# Patient Record
Sex: Male | Born: 1965 | Race: White | Hispanic: No | Smoking: Current every day smoker
Health system: Southern US, Community
[De-identification: ages and names within clinical notes are randomized; demographics above are authoritative.]

## PROBLEM LIST (undated history)

## (undated) DIAGNOSIS — G4730 Sleep apnea, unspecified: Secondary | ICD-10-CM

## (undated) DIAGNOSIS — R195 Other fecal abnormalities: Secondary | ICD-10-CM

## (undated) DIAGNOSIS — G4700 Insomnia, unspecified: Secondary | ICD-10-CM

## (undated) DIAGNOSIS — R519 Headache, unspecified: Secondary | ICD-10-CM

## (undated) DIAGNOSIS — G834 Cauda equina syndrome: Secondary | ICD-10-CM

## (undated) DIAGNOSIS — F17200 Nicotine dependence, unspecified, uncomplicated: Secondary | ICD-10-CM

## (undated) DIAGNOSIS — F4320 Adjustment disorder, unspecified: Secondary | ICD-10-CM

## (undated) DIAGNOSIS — K219 Gastro-esophageal reflux disease without esophagitis: Secondary | ICD-10-CM

## (undated) DIAGNOSIS — B9562 Methicillin resistant Staphylococcus aureus infection as the cause of diseases classified elsewhere: Secondary | ICD-10-CM

## (undated) DIAGNOSIS — R51 Headache: Secondary | ICD-10-CM

## (undated) DIAGNOSIS — M961 Postlaminectomy syndrome, not elsewhere classified: Secondary | ICD-10-CM

## (undated) DIAGNOSIS — L03119 Cellulitis of unspecified part of limb: Secondary | ICD-10-CM

## (undated) DIAGNOSIS — R7881 Bacteremia: Secondary | ICD-10-CM

## (undated) DIAGNOSIS — R5381 Other malaise: Secondary | ICD-10-CM

## (undated) DIAGNOSIS — S31000A Unspecified open wound of lower back and pelvis without penetration into retroperitoneum, initial encounter: Secondary | ICD-10-CM

## (undated) DIAGNOSIS — M5412 Radiculopathy, cervical region: Secondary | ICD-10-CM

## (undated) DIAGNOSIS — R609 Edema, unspecified: Secondary | ICD-10-CM

## (undated) DIAGNOSIS — G8929 Other chronic pain: Secondary | ICD-10-CM

## (undated) DIAGNOSIS — Z87442 Personal history of urinary calculi: Secondary | ICD-10-CM

## (undated) DIAGNOSIS — M1990 Unspecified osteoarthritis, unspecified site: Secondary | ICD-10-CM

## (undated) DIAGNOSIS — J45909 Unspecified asthma, uncomplicated: Secondary | ICD-10-CM

## (undated) HISTORY — DX: Cellulitis of unspecified part of limb: L03.119

## (undated) HISTORY — DX: Nicotine dependence, unspecified, uncomplicated: F17.200

## (undated) HISTORY — DX: Other malaise: R53.81

## (undated) HISTORY — DX: Other chronic pain: G89.29

## (undated) HISTORY — DX: Adjustment disorder, unspecified: F43.20

## (undated) HISTORY — DX: Headache, unspecified: R51.9

## (undated) HISTORY — DX: Edema, unspecified: R60.9

## (undated) HISTORY — DX: Insomnia, unspecified: G47.00

## (undated) HISTORY — PX: OTHER SURGICAL HISTORY: SHX169

## (undated) HISTORY — PX: SPINAL CORD STIMULATOR INSERTION: SHX5378

## (undated) HISTORY — PX: LUMBAR FUSION: SHX111

## (undated) HISTORY — DX: Unspecified open wound of lower back and pelvis without penetration into retroperitoneum, initial encounter: S31.000A

## (undated) HISTORY — DX: Headache: R51

## (undated) HISTORY — PX: CARPAL TUNNEL RELEASE: SHX101

## (undated) HISTORY — DX: Other fecal abnormalities: R19.5

## (undated) HISTORY — PX: POSTERIOR LAMINECTOMY / DECOMPRESSION LUMBAR SPINE: SUR740

## (undated) HISTORY — PX: CERVICAL FUSION: SHX112

---

## 2014-10-11 DIAGNOSIS — M25562 Pain in left knee: Secondary | ICD-10-CM | POA: Diagnosis not present

## 2014-10-11 DIAGNOSIS — Z79899 Other long term (current) drug therapy: Secondary | ICD-10-CM | POA: Diagnosis not present

## 2014-10-11 DIAGNOSIS — R5383 Other fatigue: Secondary | ICD-10-CM | POA: Diagnosis not present

## 2014-10-11 DIAGNOSIS — F17200 Nicotine dependence, unspecified, uncomplicated: Secondary | ICD-10-CM | POA: Diagnosis not present

## 2014-10-11 DIAGNOSIS — G894 Chronic pain syndrome: Secondary | ICD-10-CM | POA: Diagnosis not present

## 2014-10-11 DIAGNOSIS — J45909 Unspecified asthma, uncomplicated: Secondary | ICD-10-CM | POA: Diagnosis not present

## 2014-10-11 DIAGNOSIS — R6882 Decreased libido: Secondary | ICD-10-CM | POA: Diagnosis not present

## 2014-10-25 DIAGNOSIS — E785 Hyperlipidemia, unspecified: Secondary | ICD-10-CM | POA: Diagnosis not present

## 2014-10-25 DIAGNOSIS — G894 Chronic pain syndrome: Secondary | ICD-10-CM | POA: Diagnosis not present

## 2014-10-25 DIAGNOSIS — E875 Hyperkalemia: Secondary | ICD-10-CM | POA: Diagnosis not present

## 2014-10-25 DIAGNOSIS — E291 Testicular hypofunction: Secondary | ICD-10-CM | POA: Diagnosis not present

## 2014-11-28 DIAGNOSIS — J209 Acute bronchitis, unspecified: Secondary | ICD-10-CM | POA: Diagnosis not present

## 2014-12-24 DIAGNOSIS — M25562 Pain in left knee: Secondary | ICD-10-CM | POA: Diagnosis not present

## 2015-01-20 DIAGNOSIS — G9520 Unspecified cord compression: Secondary | ICD-10-CM | POA: Insufficient documentation

## 2015-01-21 DIAGNOSIS — Z4689 Encounter for fitting and adjustment of other specified devices: Secondary | ICD-10-CM | POA: Diagnosis not present

## 2015-01-23 DIAGNOSIS — Z23 Encounter for immunization: Secondary | ICD-10-CM | POA: Diagnosis not present

## 2015-01-23 DIAGNOSIS — M898X8 Other specified disorders of bone, other site: Secondary | ICD-10-CM | POA: Diagnosis not present

## 2015-01-23 DIAGNOSIS — T85890A Other specified complication of nervous system prosthetic devices, implants and grafts, initial encounter: Secondary | ICD-10-CM | POA: Diagnosis not present

## 2015-01-23 DIAGNOSIS — Z888 Allergy status to other drugs, medicaments and biological substances status: Secondary | ICD-10-CM | POA: Diagnosis not present

## 2015-01-23 DIAGNOSIS — Z981 Arthrodesis status: Secondary | ICD-10-CM | POA: Diagnosis not present

## 2015-01-23 DIAGNOSIS — F17210 Nicotine dependence, cigarettes, uncomplicated: Secondary | ICD-10-CM | POA: Diagnosis not present

## 2015-01-23 DIAGNOSIS — G9589 Other specified diseases of spinal cord: Secondary | ICD-10-CM | POA: Diagnosis not present

## 2015-01-23 DIAGNOSIS — M5116 Intervertebral disc disorders with radiculopathy, lumbar region: Secondary | ICD-10-CM | POA: Diagnosis not present

## 2015-01-23 DIAGNOSIS — M4806 Spinal stenosis, lumbar region: Secondary | ICD-10-CM | POA: Diagnosis not present

## 2015-01-23 DIAGNOSIS — G960 Cerebrospinal fluid leak: Secondary | ICD-10-CM | POA: Diagnosis not present

## 2015-01-23 DIAGNOSIS — J45909 Unspecified asthma, uncomplicated: Secondary | ICD-10-CM | POA: Diagnosis not present

## 2015-01-23 DIAGNOSIS — Z885 Allergy status to narcotic agent status: Secondary | ICD-10-CM | POA: Diagnosis not present

## 2015-02-13 DIAGNOSIS — M6281 Muscle weakness (generalized): Secondary | ICD-10-CM | POA: Diagnosis not present

## 2015-02-13 DIAGNOSIS — Z23 Encounter for immunization: Secondary | ICD-10-CM | POA: Diagnosis not present

## 2015-03-01 DIAGNOSIS — M4317 Spondylolisthesis, lumbosacral region: Secondary | ICD-10-CM | POA: Diagnosis not present

## 2015-03-01 DIAGNOSIS — R7982 Elevated C-reactive protein (CRP): Secondary | ICD-10-CM | POA: Diagnosis not present

## 2015-03-01 DIAGNOSIS — I10 Essential (primary) hypertension: Secondary | ICD-10-CM | POA: Diagnosis not present

## 2015-03-01 DIAGNOSIS — L0390 Cellulitis, unspecified: Secondary | ICD-10-CM | POA: Insufficient documentation

## 2015-03-01 DIAGNOSIS — R509 Fever, unspecified: Secondary | ICD-10-CM | POA: Diagnosis not present

## 2015-03-01 DIAGNOSIS — R700 Elevated erythrocyte sedimentation rate: Secondary | ICD-10-CM | POA: Diagnosis not present

## 2015-03-01 DIAGNOSIS — M545 Low back pain: Secondary | ICD-10-CM | POA: Diagnosis not present

## 2015-03-02 DIAGNOSIS — M7989 Other specified soft tissue disorders: Secondary | ICD-10-CM | POA: Diagnosis not present

## 2015-03-08 LAB — BASIC METABOLIC PANEL
BUN: 3 mg/dL — AB (ref 4–21)
CREATININE: 0.9 mg/dL (ref <=1.3)
GLUCOSE: 88 mg/dL
Potassium: 4.3 mmol/L (ref 3.4–5.3)

## 2015-03-08 LAB — CBC AND DIFFERENTIAL
HCT: 44 % (ref 41–53)
HEMOGLOBIN: 14.7 g/dL (ref 13.5–17.5)
PLATELETS: 156 10*3/uL (ref 150–399)
WBC: 7 10^3/mL

## 2015-03-14 ENCOUNTER — Other Ambulatory Visit: Payer: Self-pay

## 2015-03-14 ENCOUNTER — Encounter: Payer: Self-pay | Admitting: Adult Health

## 2015-03-14 ENCOUNTER — Non-Acute Institutional Stay (SKILLED_NURSING_FACILITY): Payer: Medicare Other | Admitting: Adult Health

## 2015-03-14 DIAGNOSIS — R51 Headache: Secondary | ICD-10-CM | POA: Diagnosis not present

## 2015-03-14 DIAGNOSIS — F419 Anxiety disorder, unspecified: Secondary | ICD-10-CM

## 2015-03-14 DIAGNOSIS — G8929 Other chronic pain: Secondary | ICD-10-CM

## 2015-03-14 DIAGNOSIS — R5381 Other malaise: Secondary | ICD-10-CM | POA: Diagnosis not present

## 2015-03-14 DIAGNOSIS — F329 Major depressive disorder, single episode, unspecified: Secondary | ICD-10-CM | POA: Diagnosis not present

## 2015-03-14 DIAGNOSIS — G4700 Insomnia, unspecified: Secondary | ICD-10-CM | POA: Diagnosis not present

## 2015-03-14 DIAGNOSIS — G629 Polyneuropathy, unspecified: Secondary | ICD-10-CM | POA: Diagnosis not present

## 2015-03-14 DIAGNOSIS — F0280 Dementia in other diseases classified elsewhere without behavioral disturbance: Secondary | ICD-10-CM | POA: Diagnosis not present

## 2015-03-14 DIAGNOSIS — L03115 Cellulitis of right lower limb: Secondary | ICD-10-CM | POA: Diagnosis not present

## 2015-03-14 DIAGNOSIS — S31109S Unspecified open wound of abdominal wall, unspecified quadrant without penetration into peritoneal cavity, sequela: Secondary | ICD-10-CM

## 2015-03-14 DIAGNOSIS — S31000S Unspecified open wound of lower back and pelvis without penetration into retroperitoneum, sequela: Secondary | ICD-10-CM

## 2015-03-14 DIAGNOSIS — G3183 Dementia with Lewy bodies: Secondary | ICD-10-CM

## 2015-03-14 DIAGNOSIS — F32A Depression, unspecified: Secondary | ICD-10-CM

## 2015-03-14 DIAGNOSIS — G894 Chronic pain syndrome: Secondary | ICD-10-CM

## 2015-03-14 MED ORDER — CLONAZEPAM 1 MG PO TABS: 1.0000 mg | ORAL_TABLET | Freq: Two times a day (BID) | ORAL | Status: DC | PRN

## 2015-03-14 NOTE — Telephone Encounter (Signed)
Rx faxed to [facility] @ [phone], phone number [phone]  

## 2015-03-14 NOTE — Telephone Encounter (Signed)
I called [facility] to confirm patient is a resident

## 2015-03-15 ENCOUNTER — Non-Acute Institutional Stay (SKILLED_NURSING_FACILITY): Payer: Medicare Other | Admitting: Internal Medicine

## 2015-03-15 DIAGNOSIS — S31000S Unspecified open wound of lower back and pelvis without penetration into retroperitoneum, sequela: Secondary | ICD-10-CM

## 2015-03-15 DIAGNOSIS — R519 Headache, unspecified: Secondary | ICD-10-CM

## 2015-03-15 DIAGNOSIS — R5381 Other malaise: Secondary | ICD-10-CM

## 2015-03-15 DIAGNOSIS — R51 Headache: Secondary | ICD-10-CM

## 2015-03-15 DIAGNOSIS — S31109S Unspecified open wound of abdominal wall, unspecified quadrant without penetration into peritoneal cavity, sequela: Secondary | ICD-10-CM | POA: Diagnosis not present

## 2015-03-15 DIAGNOSIS — L03115 Cellulitis of right lower limb: Secondary | ICD-10-CM

## 2015-03-15 DIAGNOSIS — G6289 Other specified polyneuropathies: Secondary | ICD-10-CM | POA: Diagnosis not present

## 2015-03-15 DIAGNOSIS — R195 Other fecal abnormalities: Secondary | ICD-10-CM | POA: Diagnosis not present

## 2015-03-15 DIAGNOSIS — F4323 Adjustment disorder with mixed anxiety and depressed mood: Secondary | ICD-10-CM | POA: Diagnosis not present

## 2015-03-15 DIAGNOSIS — G4700 Insomnia, unspecified: Secondary | ICD-10-CM

## 2015-03-15 DIAGNOSIS — G894 Chronic pain syndrome: Secondary | ICD-10-CM

## 2015-03-15 DIAGNOSIS — F17200 Nicotine dependence, unspecified, uncomplicated: Secondary | ICD-10-CM

## 2015-03-15 DIAGNOSIS — G8929 Other chronic pain: Secondary | ICD-10-CM

## 2015-03-15 NOTE — Progress Notes (Signed)
Patient ID: Jared Li, male   DOB: 09/07/1965, 49 y.o.   MRN: [id number]     [facility]   PCP: No primary care provider on file.  Code Status: FULL CODE  Allergies  Allergen Reactions  . Adhesive [Tape]   . Codeine   . Morphine And Related     Itching     Chief Complaint  Patient presents with  . New Admit To SNF     HPI:  49 y.o. patient is here for short term rehabilitation post hospital admission from 03/01/15-03/13/15 with right lower extremity cellulitis and infection of his lumbar wound. He was seen by ID and is currently on vancomycin, cipro and flagyl. Limited medical records from hospital available for review. He is seen in his room getting his antibiotic via picc line. He continues to smoke and currently has nicoderm patch. His aricept has been changed from bid to qd for unclear reason in the facility. He is on this for his chronic headache. He has a pain pump and this along with current prn pain regimen has been helping him.   Review of Systems:  Constitutional: Negative for fever, chills HENT: Negative for headache, congestion, nasal discharge, difficulty swallowing.   Eyes: Negative for eye pain, blurred vision, double vision and discharge.  Respiratory: Negative for cough, shortness of breath and wheezing.   Cardiovascular: Negative for chest pain, palpitations, leg swelling.  Gastrointestinal: Negative for heartburn, nausea, vomiting, abdominal pain. Has loose stools Genitourinary: Negative for dysuria, flank pain.  Musculoskeletal: Negative for  falls Skin: Negative for rash.  Neurological: Negative for dizziness.  Psychiatric/Behavioral: Negative for depression  Reviewed past medical history of chronic pain, asthma, chronic migraine headache, depression, neuropathic pain  Social History:   reports that he has been smoking Cigarettes.  He has been smoking about 0.50 packs per day. He uses smokeless tobacco. He  reports that he drinks alcohol. He reports that he does not use illicit drugs.  No family history on file.  Medications:   Medication List       This list is accurate as of: 03/15/15  5:17 PM.  Always use your most recent med list.               albuterol 108 (90 Base) MCG/ACT inhaler  Commonly known as:  PROVENTIL HFA;VENTOLIN HFA  Inhale into the lungs every 6 (six) hours as needed for wheezing or shortness of breath.     ciprofloxacin 500 MG tablet  Commonly known as:  CIPRO  Take 500 mg by mouth 2 (two) times daily.     clonazePAM 1 MG tablet  Commonly known as:  KLONOPIN  Take 1 tablet (1 mg total) by mouth 2 (two) times daily as needed for anxiety.     cyclobenzaprine 10 MG tablet  Commonly known as:  FLEXERIL  Take 10 mg by mouth 3 (three) times daily as needed for muscle spasms.     diphenhydrAMINE 25 MG tablet  Commonly known as:  BENADRYL  Take 25 mg by mouth every 6 (six) hours as needed.     donepezil 10 MG tablet  Commonly known as:  ARICEPT  Take 10 mg by mouth 2 (two) times daily.     dronabinol 5 MG capsule  Commonly known as:  MARINOL  Take 5 mg by mouth 2 (two) times daily before a meal.     Eszopiclone 3 MG Tabs  Take 3 mg by mouth at bedtime. Take immediately  before bedtime     gabapentin 600 MG tablet  Commonly known as:  NEURONTIN  Take 600 mg by mouth. 5 TABLET DAILY AT BEDTIME     levETIRAcetam 1000 MG tablet  Commonly known as:  KEPPRA  Take 1,000 mg by mouth 2 (two) times daily.     metroNIDAZOLE 500 MG tablet  Commonly known as:  FLAGYL  Take 500 mg by mouth 2 (two) times daily.     ondansetron 8 MG tablet  Commonly known as:  ZOFRAN  Take by mouth every 8 (eight) hours as needed for nausea or vomiting.     Oxycodone HCl 20 MG Tabs  Take 1 tablet by mouth every 4 (four) hours as needed.     rizatriptan 10 MG tablet  Commonly known as:  MAXALT  Take 10 mg by mouth as needed for migraine. May repeat in 2 hours if needed      sertraline 100 MG tablet  Commonly known as:  ZOLOFT  Take 100 mg by mouth daily.     Vancomycin HCl in Dextrose 1.5-5 GM/250ML-% Soln  Inject 1.5 g into the vein every 12 (twelve) hours.         Physical Exam: Filed Vitals:   03/15/15 1706  BP: 140/64  Pulse: 74  Temp: 97.7 F (36.5 C)  Resp: 16  SpO2: 97%    General- adult male, well built, in no acute distress Head- normocephalic, atraumatic Nose- normal nasal mucosa, no maxillary or frontal sinus tenderness, no nasal discharge Throat- moist mucus membrane Eyes- PERRLA, EOMI, no pallor, no icterus, no discharge, normal conjunctiva, normal sclera Neck- no cervical lymphadenopathy Cardiovascular- normal s1,s2, no murmurs, palpable dorsalis pedis and radial pulses, no leg edema Respiratory- bilateral clear to auscultation, no wheeze, no rhonchi, no crackles, no use of accessory muscles Abdomen- bowel sounds present, soft, non tender Musculoskeletal- able to move all 4 extremities, right upper extremity and right lower extremity weakness + Neurological- numbness from waist down, alert and oriented to person, place and time Skin- warm and dry, healing lumbar incision without drainage, scar in cervical region, LUE PICC line Nails- hypertrophy, ingrown Psychiatry- normal mood and affect    Labs reviewed: None available for review   Assessment/Plan  Physical deconditioning Will have him work with physical therapy and occupational therapy team to help with gait training and muscle strengthening exercises.fall precautions. Skin care. Encourage to be out of bed.   Lumbar wound infection Healing well. Afebrile. Continue cipro and flagyl course until 03/23/15. There is no stop date for vancomycin. Patient mentions that the plan per ID is to continue it until February. Has upcoming f/u with ID on 03/22/15. Monitor weekly cbc and cmp and get vancomycin renally dosed  RLE cellulitis Resolved, monitor clinically  Loose  stool Likely antibiotic related. Add probiotics for now, hydration to be maintained  Tobacco user Continues to smoke, discontinue nicoderm patch  Chronic pain syndrome With hx of cervical fracture and lumbar spinal stenosis. Continue oxycodone 20 mg q4h prn for breakthrough pain and has his pain pump in place  Peripheral neuropathy On gabapentin 600 mg 5 tab po qhs per discharge summary, alert and oriented at present, monitor clinically  Chronic headache On rizatriptan, keppra, aricept and dronabinol for now and monitor  Mood disorder Continue keppra and zoloft, no changes made to his dosing  Insomnia Continue eszopiclone 3 mg qhs    Goals of care: short term rehabilitation   Labs/tests ordered: cbc with diff, cmp weekly  Family/ staff Communication: reviewed care plan with patient and nursing supervisor    Oneal GroutMAHIMA Amiee Wiley, MD  [facility] [phone] (Monday-Friday 8 am - 5 pm) [phone] (afterhours)

## 2015-03-16 DIAGNOSIS — Z79899 Other long term (current) drug therapy: Secondary | ICD-10-CM | POA: Diagnosis not present

## 2015-03-21 ENCOUNTER — Other Ambulatory Visit: Payer: Self-pay | Admitting: *Deleted

## 2015-03-21 DIAGNOSIS — Z792 Long term (current) use of antibiotics: Secondary | ICD-10-CM | POA: Diagnosis not present

## 2015-03-21 DIAGNOSIS — Z79899 Other long term (current) drug therapy: Secondary | ICD-10-CM | POA: Diagnosis not present

## 2015-03-21 LAB — BASIC METABOLIC PANEL
BUN: 7 mg/dL (ref 4–21)
CREATININE: 1 mg/dL (ref 0.6–1.3)
GLUCOSE: 87 mg/dL
Potassium: 4.3 mmol/L (ref 3.4–5.3)
SODIUM: 140 mmol/L (ref 137–147)

## 2015-03-21 LAB — HEPATIC FUNCTION PANEL
ALT: 15 U/L (ref 10–40)
AST: 16 U/L (ref 14–40)
Alkaline Phosphatase: 90 U/L (ref 25–125)
BILIRUBIN, TOTAL: 0.3 mg/dL

## 2015-03-21 MED ORDER — OXYCODONE HCL 10 MG PO TABS: ORAL_TABLET | ORAL | Status: DC

## 2015-03-21 NOTE — Telephone Encounter (Signed)
[facility] 

## 2015-03-22 DIAGNOSIS — M4326 Fusion of spine, lumbar region: Secondary | ICD-10-CM | POA: Diagnosis not present

## 2015-03-22 DIAGNOSIS — Z5189 Encounter for other specified aftercare: Secondary | ICD-10-CM | POA: Diagnosis not present

## 2015-03-22 DIAGNOSIS — M6281 Muscle weakness (generalized): Secondary | ICD-10-CM | POA: Diagnosis not present

## 2015-03-22 DIAGNOSIS — R2681 Unsteadiness on feet: Secondary | ICD-10-CM | POA: Diagnosis not present

## 2015-03-22 DIAGNOSIS — M545 Low back pain: Secondary | ICD-10-CM | POA: Diagnosis not present

## 2015-03-22 DIAGNOSIS — G834 Cauda equina syndrome: Secondary | ICD-10-CM | POA: Diagnosis not present

## 2015-03-22 DIAGNOSIS — S343XXD Injury of cauda equina, subsequent encounter: Secondary | ICD-10-CM | POA: Diagnosis not present

## 2015-03-22 DIAGNOSIS — G8929 Other chronic pain: Secondary | ICD-10-CM | POA: Diagnosis not present

## 2015-03-22 DIAGNOSIS — M6249 Contracture of muscle, multiple sites: Secondary | ICD-10-CM | POA: Diagnosis not present

## 2015-03-22 DIAGNOSIS — L03115 Cellulitis of right lower limb: Secondary | ICD-10-CM | POA: Diagnosis not present

## 2015-03-23 DIAGNOSIS — M6281 Muscle weakness (generalized): Secondary | ICD-10-CM | POA: Diagnosis not present

## 2015-03-23 DIAGNOSIS — M4326 Fusion of spine, lumbar region: Secondary | ICD-10-CM | POA: Diagnosis not present

## 2015-03-23 DIAGNOSIS — Z5189 Encounter for other specified aftercare: Secondary | ICD-10-CM | POA: Diagnosis not present

## 2015-03-23 DIAGNOSIS — G8929 Other chronic pain: Secondary | ICD-10-CM | POA: Diagnosis not present

## 2015-03-23 DIAGNOSIS — M545 Low back pain: Secondary | ICD-10-CM | POA: Diagnosis not present

## 2015-03-23 DIAGNOSIS — L03115 Cellulitis of right lower limb: Secondary | ICD-10-CM | POA: Diagnosis not present

## 2015-03-23 DIAGNOSIS — R2681 Unsteadiness on feet: Secondary | ICD-10-CM | POA: Diagnosis not present

## 2015-03-23 DIAGNOSIS — S343XXD Injury of cauda equina, subsequent encounter: Secondary | ICD-10-CM | POA: Diagnosis not present

## 2015-03-23 DIAGNOSIS — G834 Cauda equina syndrome: Secondary | ICD-10-CM | POA: Diagnosis not present

## 2015-03-23 DIAGNOSIS — M6249 Contracture of muscle, multiple sites: Secondary | ICD-10-CM | POA: Diagnosis not present

## 2015-03-24 DIAGNOSIS — M6249 Contracture of muscle, multiple sites: Secondary | ICD-10-CM | POA: Diagnosis not present

## 2015-03-24 DIAGNOSIS — M6281 Muscle weakness (generalized): Secondary | ICD-10-CM | POA: Diagnosis not present

## 2015-03-24 DIAGNOSIS — Z792 Long term (current) use of antibiotics: Secondary | ICD-10-CM | POA: Diagnosis not present

## 2015-03-24 DIAGNOSIS — G8929 Other chronic pain: Secondary | ICD-10-CM | POA: Diagnosis not present

## 2015-03-24 DIAGNOSIS — R2681 Unsteadiness on feet: Secondary | ICD-10-CM | POA: Diagnosis not present

## 2015-03-24 DIAGNOSIS — G834 Cauda equina syndrome: Secondary | ICD-10-CM | POA: Diagnosis not present

## 2015-03-24 DIAGNOSIS — M4326 Fusion of spine, lumbar region: Secondary | ICD-10-CM | POA: Diagnosis not present

## 2015-03-24 DIAGNOSIS — M545 Low back pain: Secondary | ICD-10-CM | POA: Diagnosis not present

## 2015-03-24 DIAGNOSIS — S343XXD Injury of cauda equina, subsequent encounter: Secondary | ICD-10-CM | POA: Diagnosis not present

## 2015-03-24 DIAGNOSIS — L03115 Cellulitis of right lower limb: Secondary | ICD-10-CM | POA: Diagnosis not present

## 2015-03-24 DIAGNOSIS — Z5189 Encounter for other specified aftercare: Secondary | ICD-10-CM | POA: Diagnosis not present

## 2015-03-27 DIAGNOSIS — Z79899 Other long term (current) drug therapy: Secondary | ICD-10-CM | POA: Diagnosis not present

## 2015-03-27 LAB — CBC AND DIFFERENTIAL
HEMATOCRIT: 47 % (ref 41–53)
Hemoglobin: 15.3 g/dL (ref 13.5–17.5)
Platelets: 185 10*3/uL (ref 150–399)
WBC: 7 10^3/mL

## 2015-03-28 DIAGNOSIS — M6249 Contracture of muscle, multiple sites: Secondary | ICD-10-CM | POA: Diagnosis not present

## 2015-03-28 DIAGNOSIS — L03115 Cellulitis of right lower limb: Secondary | ICD-10-CM | POA: Diagnosis not present

## 2015-03-28 DIAGNOSIS — M545 Low back pain: Secondary | ICD-10-CM | POA: Diagnosis not present

## 2015-03-28 DIAGNOSIS — R2681 Unsteadiness on feet: Secondary | ICD-10-CM | POA: Diagnosis not present

## 2015-03-28 DIAGNOSIS — Z5189 Encounter for other specified aftercare: Secondary | ICD-10-CM | POA: Diagnosis not present

## 2015-03-28 DIAGNOSIS — S343XXD Injury of cauda equina, subsequent encounter: Secondary | ICD-10-CM | POA: Diagnosis not present

## 2015-03-28 DIAGNOSIS — G8929 Other chronic pain: Secondary | ICD-10-CM | POA: Diagnosis not present

## 2015-03-28 DIAGNOSIS — M6281 Muscle weakness (generalized): Secondary | ICD-10-CM | POA: Diagnosis not present

## 2015-03-28 DIAGNOSIS — M4326 Fusion of spine, lumbar region: Secondary | ICD-10-CM | POA: Diagnosis not present

## 2015-03-28 DIAGNOSIS — G834 Cauda equina syndrome: Secondary | ICD-10-CM | POA: Diagnosis not present

## 2015-03-29 DIAGNOSIS — M545 Low back pain: Secondary | ICD-10-CM | POA: Diagnosis not present

## 2015-03-29 DIAGNOSIS — G834 Cauda equina syndrome: Secondary | ICD-10-CM | POA: Diagnosis not present

## 2015-03-29 DIAGNOSIS — Z5189 Encounter for other specified aftercare: Secondary | ICD-10-CM | POA: Diagnosis not present

## 2015-03-29 DIAGNOSIS — R2681 Unsteadiness on feet: Secondary | ICD-10-CM | POA: Diagnosis not present

## 2015-03-29 DIAGNOSIS — L03115 Cellulitis of right lower limb: Secondary | ICD-10-CM | POA: Diagnosis not present

## 2015-03-29 DIAGNOSIS — M6281 Muscle weakness (generalized): Secondary | ICD-10-CM | POA: Diagnosis not present

## 2015-03-29 DIAGNOSIS — M4326 Fusion of spine, lumbar region: Secondary | ICD-10-CM | POA: Diagnosis not present

## 2015-03-29 DIAGNOSIS — G8929 Other chronic pain: Secondary | ICD-10-CM | POA: Diagnosis not present

## 2015-03-29 DIAGNOSIS — S343XXD Injury of cauda equina, subsequent encounter: Secondary | ICD-10-CM | POA: Diagnosis not present

## 2015-03-29 DIAGNOSIS — M6249 Contracture of muscle, multiple sites: Secondary | ICD-10-CM | POA: Diagnosis not present

## 2015-03-30 DIAGNOSIS — G834 Cauda equina syndrome: Secondary | ICD-10-CM | POA: Diagnosis not present

## 2015-03-30 DIAGNOSIS — R2681 Unsteadiness on feet: Secondary | ICD-10-CM | POA: Diagnosis not present

## 2015-03-30 DIAGNOSIS — M545 Low back pain: Secondary | ICD-10-CM | POA: Diagnosis not present

## 2015-03-30 DIAGNOSIS — Z792 Long term (current) use of antibiotics: Secondary | ICD-10-CM | POA: Diagnosis not present

## 2015-03-30 DIAGNOSIS — M6281 Muscle weakness (generalized): Secondary | ICD-10-CM | POA: Diagnosis not present

## 2015-03-30 DIAGNOSIS — L03115 Cellulitis of right lower limb: Secondary | ICD-10-CM | POA: Diagnosis not present

## 2015-03-30 DIAGNOSIS — M4326 Fusion of spine, lumbar region: Secondary | ICD-10-CM | POA: Diagnosis not present

## 2015-03-30 DIAGNOSIS — S343XXD Injury of cauda equina, subsequent encounter: Secondary | ICD-10-CM | POA: Diagnosis not present

## 2015-03-30 DIAGNOSIS — Z5189 Encounter for other specified aftercare: Secondary | ICD-10-CM | POA: Diagnosis not present

## 2015-03-30 DIAGNOSIS — M6249 Contracture of muscle, multiple sites: Secondary | ICD-10-CM | POA: Diagnosis not present

## 2015-03-30 DIAGNOSIS — G8929 Other chronic pain: Secondary | ICD-10-CM | POA: Diagnosis not present

## 2015-03-30 LAB — BASIC METABOLIC PANEL
BUN: 8 mg/dL (ref 4–21)
CREATININE: 8.6 mg/dL — AB (ref 0.6–1.3)
GLUCOSE: 185 mg/dL
Potassium: 4.1 mmol/L (ref 3.4–5.3)
Sodium: 135 mmol/L — AB (ref 137–147)

## 2015-03-31 DIAGNOSIS — S343XXD Injury of cauda equina, subsequent encounter: Secondary | ICD-10-CM | POA: Diagnosis not present

## 2015-03-31 DIAGNOSIS — M4326 Fusion of spine, lumbar region: Secondary | ICD-10-CM | POA: Diagnosis not present

## 2015-03-31 DIAGNOSIS — Z5189 Encounter for other specified aftercare: Secondary | ICD-10-CM | POA: Diagnosis not present

## 2015-03-31 DIAGNOSIS — L03115 Cellulitis of right lower limb: Secondary | ICD-10-CM | POA: Diagnosis not present

## 2015-03-31 DIAGNOSIS — R2681 Unsteadiness on feet: Secondary | ICD-10-CM | POA: Diagnosis not present

## 2015-03-31 DIAGNOSIS — M6281 Muscle weakness (generalized): Secondary | ICD-10-CM | POA: Diagnosis not present

## 2015-03-31 DIAGNOSIS — G834 Cauda equina syndrome: Secondary | ICD-10-CM | POA: Diagnosis not present

## 2015-03-31 DIAGNOSIS — G8929 Other chronic pain: Secondary | ICD-10-CM | POA: Diagnosis not present

## 2015-03-31 DIAGNOSIS — M6249 Contracture of muscle, multiple sites: Secondary | ICD-10-CM | POA: Diagnosis not present

## 2015-03-31 DIAGNOSIS — M545 Low back pain: Secondary | ICD-10-CM | POA: Diagnosis not present

## 2015-04-03 DIAGNOSIS — G8929 Other chronic pain: Secondary | ICD-10-CM | POA: Diagnosis not present

## 2015-04-03 DIAGNOSIS — M4326 Fusion of spine, lumbar region: Secondary | ICD-10-CM | POA: Diagnosis not present

## 2015-04-03 DIAGNOSIS — M6281 Muscle weakness (generalized): Secondary | ICD-10-CM | POA: Diagnosis not present

## 2015-04-03 DIAGNOSIS — R2681 Unsteadiness on feet: Secondary | ICD-10-CM | POA: Diagnosis not present

## 2015-04-03 DIAGNOSIS — L03115 Cellulitis of right lower limb: Secondary | ICD-10-CM | POA: Diagnosis not present

## 2015-04-03 DIAGNOSIS — Z79899 Other long term (current) drug therapy: Secondary | ICD-10-CM | POA: Diagnosis not present

## 2015-04-03 DIAGNOSIS — G834 Cauda equina syndrome: Secondary | ICD-10-CM | POA: Diagnosis not present

## 2015-04-03 DIAGNOSIS — M545 Low back pain: Secondary | ICD-10-CM | POA: Diagnosis not present

## 2015-04-03 DIAGNOSIS — Z5189 Encounter for other specified aftercare: Secondary | ICD-10-CM | POA: Diagnosis not present

## 2015-04-03 DIAGNOSIS — S343XXD Injury of cauda equina, subsequent encounter: Secondary | ICD-10-CM | POA: Diagnosis not present

## 2015-04-03 DIAGNOSIS — M6249 Contracture of muscle, multiple sites: Secondary | ICD-10-CM | POA: Diagnosis not present

## 2015-04-03 LAB — HEPATIC FUNCTION PANEL
ALK PHOS: 103 U/L (ref 25–125)
ALT: 15 U/L (ref 10–40)
AST: 16 U/L (ref 14–40)
BILIRUBIN, TOTAL: 0.3 mg/dL

## 2015-04-03 LAB — CBC AND DIFFERENTIAL
HCT: 42 % (ref 41–53)
Hemoglobin: 13.7 g/dL (ref 13.5–17.5)
NEUTROS ABS: 7 /uL
PLATELETS: 211 10*3/uL (ref 150–399)
WBC: 9.8 10^3/mL

## 2015-04-03 LAB — BASIC METABOLIC PANEL
BUN: 4 mg/dL (ref 4–21)
CREATININE: 0.7 mg/dL (ref 0.6–1.3)
GLUCOSE: 83 mg/dL
Potassium: 4.3 mmol/L (ref 3.4–5.3)
Sodium: 134 mmol/L — AB (ref 137–147)

## 2015-04-04 DIAGNOSIS — M6249 Contracture of muscle, multiple sites: Secondary | ICD-10-CM | POA: Diagnosis not present

## 2015-04-04 DIAGNOSIS — S343XXD Injury of cauda equina, subsequent encounter: Secondary | ICD-10-CM | POA: Diagnosis not present

## 2015-04-04 DIAGNOSIS — Z5189 Encounter for other specified aftercare: Secondary | ICD-10-CM | POA: Diagnosis not present

## 2015-04-04 DIAGNOSIS — L03115 Cellulitis of right lower limb: Secondary | ICD-10-CM | POA: Diagnosis not present

## 2015-04-04 DIAGNOSIS — G834 Cauda equina syndrome: Secondary | ICD-10-CM | POA: Diagnosis not present

## 2015-04-04 DIAGNOSIS — M6281 Muscle weakness (generalized): Secondary | ICD-10-CM | POA: Diagnosis not present

## 2015-04-04 DIAGNOSIS — G8929 Other chronic pain: Secondary | ICD-10-CM | POA: Diagnosis not present

## 2015-04-04 DIAGNOSIS — M545 Low back pain: Secondary | ICD-10-CM | POA: Diagnosis not present

## 2015-04-04 DIAGNOSIS — M4326 Fusion of spine, lumbar region: Secondary | ICD-10-CM | POA: Diagnosis not present

## 2015-04-04 DIAGNOSIS — R2681 Unsteadiness on feet: Secondary | ICD-10-CM | POA: Diagnosis not present

## 2015-04-05 ENCOUNTER — Non-Acute Institutional Stay (SKILLED_NURSING_FACILITY): Payer: Medicare Other | Admitting: Adult Health

## 2015-04-05 ENCOUNTER — Encounter: Payer: Self-pay | Admitting: Adult Health

## 2015-04-05 DIAGNOSIS — G3183 Dementia with Lewy bodies: Secondary | ICD-10-CM | POA: Diagnosis not present

## 2015-04-05 DIAGNOSIS — G629 Polyneuropathy, unspecified: Secondary | ICD-10-CM

## 2015-04-05 DIAGNOSIS — G894 Chronic pain syndrome: Secondary | ICD-10-CM | POA: Diagnosis not present

## 2015-04-05 DIAGNOSIS — F0280 Dementia in other diseases classified elsewhere without behavioral disturbance: Secondary | ICD-10-CM

## 2015-04-05 DIAGNOSIS — R519 Headache, unspecified: Secondary | ICD-10-CM

## 2015-04-05 DIAGNOSIS — F419 Anxiety disorder, unspecified: Secondary | ICD-10-CM

## 2015-04-05 DIAGNOSIS — M6249 Contracture of muscle, multiple sites: Secondary | ICD-10-CM | POA: Diagnosis not present

## 2015-04-05 DIAGNOSIS — M545 Low back pain: Secondary | ICD-10-CM | POA: Diagnosis not present

## 2015-04-05 DIAGNOSIS — F329 Major depressive disorder, single episode, unspecified: Secondary | ICD-10-CM

## 2015-04-05 DIAGNOSIS — S31109S Unspecified open wound of abdominal wall, unspecified quadrant without penetration into peritoneal cavity, sequela: Secondary | ICD-10-CM

## 2015-04-05 DIAGNOSIS — R5381 Other malaise: Secondary | ICD-10-CM

## 2015-04-05 DIAGNOSIS — G4700 Insomnia, unspecified: Secondary | ICD-10-CM | POA: Diagnosis not present

## 2015-04-05 DIAGNOSIS — Z5189 Encounter for other specified aftercare: Secondary | ICD-10-CM | POA: Diagnosis not present

## 2015-04-05 DIAGNOSIS — R51 Headache: Secondary | ICD-10-CM

## 2015-04-05 DIAGNOSIS — R2681 Unsteadiness on feet: Secondary | ICD-10-CM | POA: Diagnosis not present

## 2015-04-05 DIAGNOSIS — F32A Depression, unspecified: Secondary | ICD-10-CM

## 2015-04-05 DIAGNOSIS — M6281 Muscle weakness (generalized): Secondary | ICD-10-CM | POA: Diagnosis not present

## 2015-04-05 DIAGNOSIS — G834 Cauda equina syndrome: Secondary | ICD-10-CM | POA: Diagnosis not present

## 2015-04-05 DIAGNOSIS — S31000S Unspecified open wound of lower back and pelvis without penetration into retroperitoneum, sequela: Secondary | ICD-10-CM

## 2015-04-05 DIAGNOSIS — G8929 Other chronic pain: Secondary | ICD-10-CM

## 2015-04-05 DIAGNOSIS — S343XXD Injury of cauda equina, subsequent encounter: Secondary | ICD-10-CM | POA: Diagnosis not present

## 2015-04-05 DIAGNOSIS — L03115 Cellulitis of right lower limb: Secondary | ICD-10-CM | POA: Diagnosis not present

## 2015-04-05 DIAGNOSIS — M4326 Fusion of spine, lumbar region: Secondary | ICD-10-CM | POA: Diagnosis not present

## 2015-04-06 DIAGNOSIS — R2681 Unsteadiness on feet: Secondary | ICD-10-CM | POA: Diagnosis not present

## 2015-04-06 DIAGNOSIS — M6281 Muscle weakness (generalized): Secondary | ICD-10-CM | POA: Diagnosis not present

## 2015-04-06 DIAGNOSIS — G834 Cauda equina syndrome: Secondary | ICD-10-CM | POA: Diagnosis not present

## 2015-04-06 DIAGNOSIS — G8929 Other chronic pain: Secondary | ICD-10-CM | POA: Diagnosis not present

## 2015-04-06 DIAGNOSIS — L03115 Cellulitis of right lower limb: Secondary | ICD-10-CM | POA: Diagnosis not present

## 2015-04-06 DIAGNOSIS — M545 Low back pain: Secondary | ICD-10-CM | POA: Diagnosis not present

## 2015-04-06 DIAGNOSIS — M6249 Contracture of muscle, multiple sites: Secondary | ICD-10-CM | POA: Diagnosis not present

## 2015-04-06 DIAGNOSIS — M4326 Fusion of spine, lumbar region: Secondary | ICD-10-CM | POA: Diagnosis not present

## 2015-04-06 DIAGNOSIS — Z5189 Encounter for other specified aftercare: Secondary | ICD-10-CM | POA: Diagnosis not present

## 2015-04-06 DIAGNOSIS — S343XXD Injury of cauda equina, subsequent encounter: Secondary | ICD-10-CM | POA: Diagnosis not present

## 2015-04-07 DIAGNOSIS — M545 Low back pain: Secondary | ICD-10-CM | POA: Diagnosis not present

## 2015-04-07 DIAGNOSIS — Z5189 Encounter for other specified aftercare: Secondary | ICD-10-CM | POA: Diagnosis not present

## 2015-04-07 DIAGNOSIS — S343XXD Injury of cauda equina, subsequent encounter: Secondary | ICD-10-CM | POA: Diagnosis not present

## 2015-04-07 DIAGNOSIS — G834 Cauda equina syndrome: Secondary | ICD-10-CM | POA: Diagnosis not present

## 2015-04-07 DIAGNOSIS — R2681 Unsteadiness on feet: Secondary | ICD-10-CM | POA: Diagnosis not present

## 2015-04-07 DIAGNOSIS — M4326 Fusion of spine, lumbar region: Secondary | ICD-10-CM | POA: Diagnosis not present

## 2015-04-07 DIAGNOSIS — L03115 Cellulitis of right lower limb: Secondary | ICD-10-CM | POA: Diagnosis not present

## 2015-04-07 DIAGNOSIS — M6249 Contracture of muscle, multiple sites: Secondary | ICD-10-CM | POA: Diagnosis not present

## 2015-04-07 DIAGNOSIS — M6281 Muscle weakness (generalized): Secondary | ICD-10-CM | POA: Diagnosis not present

## 2015-04-07 DIAGNOSIS — G8929 Other chronic pain: Secondary | ICD-10-CM | POA: Diagnosis not present

## 2015-04-10 DIAGNOSIS — G8929 Other chronic pain: Secondary | ICD-10-CM | POA: Diagnosis not present

## 2015-04-10 DIAGNOSIS — M4326 Fusion of spine, lumbar region: Secondary | ICD-10-CM | POA: Diagnosis not present

## 2015-04-10 DIAGNOSIS — Z5189 Encounter for other specified aftercare: Secondary | ICD-10-CM | POA: Diagnosis not present

## 2015-04-10 DIAGNOSIS — M545 Low back pain: Secondary | ICD-10-CM | POA: Diagnosis not present

## 2015-04-10 DIAGNOSIS — M6249 Contracture of muscle, multiple sites: Secondary | ICD-10-CM | POA: Diagnosis not present

## 2015-04-10 DIAGNOSIS — S343XXD Injury of cauda equina, subsequent encounter: Secondary | ICD-10-CM | POA: Diagnosis not present

## 2015-04-10 DIAGNOSIS — Z79899 Other long term (current) drug therapy: Secondary | ICD-10-CM | POA: Diagnosis not present

## 2015-04-10 DIAGNOSIS — L03115 Cellulitis of right lower limb: Secondary | ICD-10-CM | POA: Diagnosis not present

## 2015-04-10 DIAGNOSIS — R2681 Unsteadiness on feet: Secondary | ICD-10-CM | POA: Diagnosis not present

## 2015-04-10 DIAGNOSIS — M6281 Muscle weakness (generalized): Secondary | ICD-10-CM | POA: Diagnosis not present

## 2015-04-10 DIAGNOSIS — G834 Cauda equina syndrome: Secondary | ICD-10-CM | POA: Diagnosis not present

## 2015-04-10 LAB — BASIC METABOLIC PANEL
BUN: 8 mg/dL (ref 4–21)
Creatinine: 0.9 mg/dL (ref 0.6–1.3)
Glucose: 81 mg/dL
POTASSIUM: 4.1 mmol/L (ref 3.4–5.3)
SODIUM: 131 mmol/L — AB (ref 137–147)

## 2015-04-10 LAB — HEPATIC FUNCTION PANEL
ALK PHOS: 111 U/L (ref 25–125)
ALT: 14 U/L (ref 10–40)
AST: 16 U/L (ref 14–40)
BILIRUBIN, TOTAL: 0.4 mg/dL

## 2015-04-10 LAB — CBC AND DIFFERENTIAL
HEMATOCRIT: 45 % (ref 41–53)
Hemoglobin: 14.9 g/dL (ref 13.5–17.5)
NEUTROS ABS: 4 /uL
Platelets: 224 10*3/uL (ref 150–399)
WBC: 6.8 10^3/mL

## 2015-04-11 DIAGNOSIS — T85738A Infection and inflammatory reaction due to other nervous system device, implant or graft, initial encounter: Secondary | ICD-10-CM | POA: Insufficient documentation

## 2015-04-11 DIAGNOSIS — R2681 Unsteadiness on feet: Secondary | ICD-10-CM | POA: Diagnosis not present

## 2015-04-11 DIAGNOSIS — Z5189 Encounter for other specified aftercare: Secondary | ICD-10-CM | POA: Diagnosis not present

## 2015-04-11 DIAGNOSIS — S343XXD Injury of cauda equina, subsequent encounter: Secondary | ICD-10-CM | POA: Diagnosis not present

## 2015-04-11 DIAGNOSIS — M4326 Fusion of spine, lumbar region: Secondary | ICD-10-CM | POA: Diagnosis not present

## 2015-04-11 DIAGNOSIS — M545 Low back pain: Secondary | ICD-10-CM | POA: Diagnosis not present

## 2015-04-11 DIAGNOSIS — G834 Cauda equina syndrome: Secondary | ICD-10-CM | POA: Diagnosis not present

## 2015-04-11 DIAGNOSIS — G8929 Other chronic pain: Secondary | ICD-10-CM | POA: Diagnosis not present

## 2015-04-11 DIAGNOSIS — M6249 Contracture of muscle, multiple sites: Secondary | ICD-10-CM | POA: Diagnosis not present

## 2015-04-11 DIAGNOSIS — M6281 Muscle weakness (generalized): Secondary | ICD-10-CM | POA: Diagnosis not present

## 2015-04-11 DIAGNOSIS — L03115 Cellulitis of right lower limb: Secondary | ICD-10-CM | POA: Diagnosis not present

## 2015-04-13 DIAGNOSIS — G834 Cauda equina syndrome: Secondary | ICD-10-CM | POA: Diagnosis not present

## 2015-04-13 DIAGNOSIS — M6249 Contracture of muscle, multiple sites: Secondary | ICD-10-CM | POA: Diagnosis not present

## 2015-04-13 DIAGNOSIS — Z5189 Encounter for other specified aftercare: Secondary | ICD-10-CM | POA: Diagnosis not present

## 2015-04-13 DIAGNOSIS — G8929 Other chronic pain: Secondary | ICD-10-CM | POA: Diagnosis not present

## 2015-04-13 DIAGNOSIS — M545 Low back pain: Secondary | ICD-10-CM | POA: Diagnosis not present

## 2015-04-13 DIAGNOSIS — M4326 Fusion of spine, lumbar region: Secondary | ICD-10-CM | POA: Diagnosis not present

## 2015-04-13 DIAGNOSIS — R2681 Unsteadiness on feet: Secondary | ICD-10-CM | POA: Diagnosis not present

## 2015-04-13 DIAGNOSIS — L03115 Cellulitis of right lower limb: Secondary | ICD-10-CM | POA: Diagnosis not present

## 2015-04-13 DIAGNOSIS — S343XXD Injury of cauda equina, subsequent encounter: Secondary | ICD-10-CM | POA: Diagnosis not present

## 2015-04-13 DIAGNOSIS — M6281 Muscle weakness (generalized): Secondary | ICD-10-CM | POA: Diagnosis not present

## 2015-04-14 ENCOUNTER — Other Ambulatory Visit: Payer: Self-pay | Admitting: *Deleted

## 2015-04-14 DIAGNOSIS — M6249 Contracture of muscle, multiple sites: Secondary | ICD-10-CM | POA: Diagnosis not present

## 2015-04-14 DIAGNOSIS — L03115 Cellulitis of right lower limb: Secondary | ICD-10-CM | POA: Diagnosis not present

## 2015-04-14 DIAGNOSIS — G834 Cauda equina syndrome: Secondary | ICD-10-CM | POA: Diagnosis not present

## 2015-04-14 DIAGNOSIS — M4326 Fusion of spine, lumbar region: Secondary | ICD-10-CM | POA: Diagnosis not present

## 2015-04-14 DIAGNOSIS — G8929 Other chronic pain: Secondary | ICD-10-CM | POA: Diagnosis not present

## 2015-04-14 DIAGNOSIS — R2681 Unsteadiness on feet: Secondary | ICD-10-CM | POA: Diagnosis not present

## 2015-04-14 DIAGNOSIS — S343XXD Injury of cauda equina, subsequent encounter: Secondary | ICD-10-CM | POA: Diagnosis not present

## 2015-04-14 DIAGNOSIS — M545 Low back pain: Secondary | ICD-10-CM | POA: Diagnosis not present

## 2015-04-14 DIAGNOSIS — Z5189 Encounter for other specified aftercare: Secondary | ICD-10-CM | POA: Diagnosis not present

## 2015-04-14 DIAGNOSIS — M6281 Muscle weakness (generalized): Secondary | ICD-10-CM | POA: Diagnosis not present

## 2015-04-14 MED ORDER — DRONABINOL 5 MG PO CAPS: ORAL_CAPSULE | ORAL | Status: DC

## 2015-04-14 MED ORDER — DRONABINOL 5 MG PO CAPS: ORAL_CAPSULE | ORAL | Status: AC

## 2015-04-14 NOTE — Telephone Encounter (Signed)
[facility] 

## 2015-04-17 DIAGNOSIS — M545 Low back pain: Secondary | ICD-10-CM | POA: Diagnosis not present

## 2015-04-17 DIAGNOSIS — Z5189 Encounter for other specified aftercare: Secondary | ICD-10-CM | POA: Diagnosis not present

## 2015-04-17 DIAGNOSIS — M6281 Muscle weakness (generalized): Secondary | ICD-10-CM | POA: Diagnosis not present

## 2015-04-17 DIAGNOSIS — S343XXD Injury of cauda equina, subsequent encounter: Secondary | ICD-10-CM | POA: Diagnosis not present

## 2015-04-17 DIAGNOSIS — G834 Cauda equina syndrome: Secondary | ICD-10-CM | POA: Diagnosis not present

## 2015-04-17 DIAGNOSIS — M6249 Contracture of muscle, multiple sites: Secondary | ICD-10-CM | POA: Diagnosis not present

## 2015-04-17 DIAGNOSIS — Z79899 Other long term (current) drug therapy: Secondary | ICD-10-CM | POA: Diagnosis not present

## 2015-04-17 DIAGNOSIS — M4326 Fusion of spine, lumbar region: Secondary | ICD-10-CM | POA: Diagnosis not present

## 2015-04-17 DIAGNOSIS — R2681 Unsteadiness on feet: Secondary | ICD-10-CM | POA: Diagnosis not present

## 2015-04-17 DIAGNOSIS — G8929 Other chronic pain: Secondary | ICD-10-CM | POA: Diagnosis not present

## 2015-04-17 DIAGNOSIS — L03115 Cellulitis of right lower limb: Secondary | ICD-10-CM | POA: Diagnosis not present

## 2015-04-17 LAB — CBC AND DIFFERENTIAL
HCT: 47 % (ref 41–53)
Hemoglobin: 15.3 g/dL (ref 13.5–17.5)
Neutrophils Absolute: 4 /uL
Platelets: 217 10*3/uL (ref 150–399)
WBC: 7.3 10^3/mL

## 2015-04-17 LAB — HEPATIC FUNCTION PANEL
ALT: 15 U/L (ref 10–40)
AST: 15 U/L (ref 14–40)
Alkaline Phosphatase: 125 U/L (ref 25–125)
Bilirubin, Total: 0.3 mg/dL

## 2015-04-17 LAB — BASIC METABOLIC PANEL
BUN: 8 mg/dL (ref 4–21)
CREATININE: 0.9 mg/dL (ref 0.6–1.3)
GLUCOSE: 82 mg/dL
POTASSIUM: 4.3 mmol/L (ref 3.4–5.3)
SODIUM: 139 mmol/L (ref 137–147)

## 2015-04-18 ENCOUNTER — Encounter: Payer: Self-pay | Admitting: Adult Health

## 2015-04-18 ENCOUNTER — Non-Acute Institutional Stay (SKILLED_NURSING_FACILITY): Payer: Medicare Other | Admitting: Adult Health

## 2015-04-18 DIAGNOSIS — G834 Cauda equina syndrome: Secondary | ICD-10-CM | POA: Diagnosis not present

## 2015-04-18 DIAGNOSIS — G4700 Insomnia, unspecified: Secondary | ICD-10-CM

## 2015-04-18 DIAGNOSIS — G894 Chronic pain syndrome: Secondary | ICD-10-CM

## 2015-04-18 DIAGNOSIS — G3183 Dementia with Lewy bodies: Secondary | ICD-10-CM

## 2015-04-18 DIAGNOSIS — Z5189 Encounter for other specified aftercare: Secondary | ICD-10-CM | POA: Diagnosis not present

## 2015-04-18 DIAGNOSIS — E46 Unspecified protein-calorie malnutrition: Secondary | ICD-10-CM

## 2015-04-18 DIAGNOSIS — M6249 Contracture of muscle, multiple sites: Secondary | ICD-10-CM | POA: Diagnosis not present

## 2015-04-18 DIAGNOSIS — F32A Depression, unspecified: Secondary | ICD-10-CM

## 2015-04-18 DIAGNOSIS — R5381 Other malaise: Secondary | ICD-10-CM | POA: Diagnosis not present

## 2015-04-18 DIAGNOSIS — G629 Polyneuropathy, unspecified: Secondary | ICD-10-CM

## 2015-04-18 DIAGNOSIS — M545 Low back pain: Secondary | ICD-10-CM | POA: Diagnosis not present

## 2015-04-18 DIAGNOSIS — R51 Headache: Secondary | ICD-10-CM | POA: Diagnosis not present

## 2015-04-18 DIAGNOSIS — M4326 Fusion of spine, lumbar region: Secondary | ICD-10-CM | POA: Diagnosis not present

## 2015-04-18 DIAGNOSIS — S343XXD Injury of cauda equina, subsequent encounter: Secondary | ICD-10-CM | POA: Diagnosis not present

## 2015-04-18 DIAGNOSIS — G8929 Other chronic pain: Secondary | ICD-10-CM | POA: Diagnosis not present

## 2015-04-18 DIAGNOSIS — F329 Major depressive disorder, single episode, unspecified: Secondary | ICD-10-CM

## 2015-04-18 DIAGNOSIS — F419 Anxiety disorder, unspecified: Secondary | ICD-10-CM | POA: Diagnosis not present

## 2015-04-18 DIAGNOSIS — R2681 Unsteadiness on feet: Secondary | ICD-10-CM | POA: Diagnosis not present

## 2015-04-18 DIAGNOSIS — F0280 Dementia in other diseases classified elsewhere without behavioral disturbance: Secondary | ICD-10-CM

## 2015-04-18 DIAGNOSIS — M6281 Muscle weakness (generalized): Secondary | ICD-10-CM | POA: Diagnosis not present

## 2015-04-18 DIAGNOSIS — L03115 Cellulitis of right lower limb: Secondary | ICD-10-CM | POA: Diagnosis not present

## 2015-04-18 DIAGNOSIS — S31109S Unspecified open wound of abdominal wall, unspecified quadrant without penetration into peritoneal cavity, sequela: Secondary | ICD-10-CM

## 2015-04-18 DIAGNOSIS — S31000S Unspecified open wound of lower back and pelvis without penetration into retroperitoneum, sequela: Secondary | ICD-10-CM

## 2015-04-18 NOTE — Progress Notes (Signed)
Patient ID: Jared Li, male   DOB: 01/05/1966, 50 y.o.   MRN: [id number]    DATE:  04/18/15  MRN:  [id number]  BIRTHDAY: [birth year]  Facility:  Nursing Home Location:  [facility]  Nursing Home Room Number: [id number]  LEVEL OF CARE:  SNF [phone])  Contact Information    Name Relation Home Work [place] Brother [phone]  [phone]   Jimie, [place] [phone]         Code Status History    FULL CODE       Chief Complaint  Patient presents with  . Discharge Note    Physical deconditioning, lumbar wound infection, chronic pain, insomnia, anxiety, dementia, depression, neuropathy, headache and protein calorie malnutrition    HISTORY OF PRESENT ILLNESS:  This is a 50 year old male who is for discharge home with follow home health PT for endurance. DME: Standard wheelchair 16" X 16" ultralight weight quick release wheels, swing away leg and arm rests, ground reaction AFO right foot, right static wrist support brace, universal size. He has been admitted to [facility] on 03/13/15 from [facility] with RLE cellulitis and infection of his lumbar wound. He is being followed up by ID and currently on Augmentin indefinitely.  Patient was admitted to this facility for short-term rehabilitation after the patient's recent hospitalization.  Patient has completed SNF rehabilitation and therapy has cleared the patient for discharge.  PAST MEDICAL HISTORY:  Past Medical History  Diagnosis Date  . Cellulitis of lower limb   . Edema   . Chronic pain   . Adjustment disorder   . Tobacco use disorder   . Chronic intractable headache   . Insomnia   . Physical deconditioning   . Open wound of lumbar region with complication   . Loose stools      CURRENT MEDICATIONS: Reviewed  Patient's Medications  New Prescriptions   No medications on file  Previous Medications   ACETAMINOPHEN (TYLENOL) 500 MG TABLET    Take 500 mg by  mouth every 6 (six) hours as needed for mild pain.   ALBUTEROL SULFATE (PROAIR HFA IN)    Inhale 2 puffs into the lungs every 6 (six) hours as needed (SOB or wheezing).   AMOXICILLIN-CLAVULANATE (AUGMENTIN) 875-125 MG TABLET    Take 1 tablet by mouth 2 (two) times daily.    CLONAZEPAM (KLONOPIN) 1 MG TABLET    Take 1 mg by mouth 2 (two) times daily as needed for anxiety.   CYCLOBENZAPRINE (FLEXERIL) 10 MG TABLET    Take 10 mg by mouth 3 (three) times daily as needed for muscle spasms.   DIPHENHYDRAMINE (BENADRYL) 25 MG TABLET    Take 25 mg by mouth every 6 (six) hours as needed.   DONEPEZIL (ARICEPT) 10 MG TABLET    Take 10 mg by mouth 2 (two) times daily.   DRONABINOL (MARINOL) 5 MG CAPSULE    Take one capsule by mouth twice daily before meals   ESZOPICLONE 3 MG TABS    Take 3 mg by mouth at bedtime as needed. Take immediately before bedtime   GABAPENTIN, ONCE-DAILY, (GRALISE) 600 MG TABS    Take 600 mg by mouth. Five (5) tabs po QHS for pain   LEVETIRACETAM (KEPPRA) 1000 MG TABLET    Take 1,000 mg by mouth 2 (two) times daily.    OXYCODONE HCL 10 MG TABS    Take two tablets by mouth every 4 hours as needed for pain  PROMETHAZINE (PHENERGAN) 12.5 MG TABLET    Take 12.5 mg by mouth every 6 (six) hours as needed for nausea or vomiting.   PROTEIN (PROCEL 100 PO)    Take 2 scoop by mouth 2 (two) times daily.   RIZATRIPTAN (MAXALT) 10 MG TABLET    Take 10 mg by mouth as needed for migraine. May repeat in 2 hours if needed   SACCHAROMYCES BOULARDII (FLORASTOR) 250 MG CAPSULE    Take 250 mg by mouth 2 (two) times daily.   SERTRALINE (ZOLOFT) 100 MG TABLET    Take 100 mg by mouth daily.  Modified Medications   No medications on file  Discontinued Medications   No medications on file     Allergies  Allergen Reactions  . Adhesive [Tape]   . Codeine   . Morphine And Related     Itching      REVIEW OF SYSTEMS:  GENERAL: no change in appetite, no fatigue, no weight changes, no fever, chills  or weakness EYES: Denies change in vision, dry eyes, eye pain, itching or discharge EARS: Denies change in hearing, ringing in ears, or earache NOSE: Denies nasal congestion or epistaxis MOUTH and THROAT: Denies oral discomfort, gingival pain or bleeding, pain from teeth or hoarseness   RESPIRATORY: no cough, SOB, DOE, wheezing, hemoptysis CARDIAC: no chest pain, edema or palpitations GI: no abdominal pain, diarrhea, constipation, heart burn, nausea or vomiting GU: Denies dysuria, frequency, hematuria, incontinence, or discharge PSYCHIATRIC: Denies feeling of depression or anxiety. No report of hallucinations, insomnia, paranoia, or agitation    PHYSICAL EXAMINATION  GENERAL APPEARANCE: Well nourished. In no acute distress. Normal body habitus SKIN:  Lumbar surgical wound is dry, no redness HEAD: Normal in size and contour. No evidence of trauma EYES: Lids open and close normally. No blepharitis, entropion or ectropion. PERRL. Conjunctivae are clear and sclerae are white. Lenses are without opacity EARS: Pinnae are normal. Patient hears normal voice tunes of the examiner MOUTH and THROAT: Lips are without lesions. Oral mucosa is moist and without lesions. Tongue is normal in shape, size, and color and without lesions NECK: supple, trachea midline, no neck masses, no thyroid tenderness, no thyromegaly LYMPHATICS: no LAN in the neck, no supraclavicular LAN RESPIRATORY: breathing is even & unlabored, BS CTAB CARDIAC: RRR, no murmur,no extra heart sounds, no edema GI: abdomen soft, normal BS, no masses, no tenderness, no hepatomegaly, no splenomegaly; has pain pump implant EXTREMITIES:  Able to move 4 extremities PSYCHIATRIC: Alert and oriented X 3. Affect and behavior are appropriate  LABS/RADIOLOGY: Labs reviewed: Basic Metabolic Panel:  Recent Labs  16/10/96 04/10/15 04/17/15  NA 134* 131* 139  K 4.3 4.1 4.3  BUN CREATININE 0.7 0.9 0.9   Liver Function Tests:  Recent  Labs  04/03/15 04/10/15 04/17/15  AST ALT ALKPHOS 103 111 125   CBC:  Recent Labs  04/03/15 04/10/15 04/17/15  WBC 9.8 6.8 7.3  NEUTROABS HGB 13.7 14.9 15.3  HCT 42 45 47  PLT 211 224 217    ASSESSMENT/PLAN:  Physical deconditioning - for home health PT  Lumbar wound infection - continue Augmentin 875-125 mg 1 tab by mouth twice a day indefinitely and Florastor 250 mg 1 capsule by mouth twice a day; follow-up with ID  Chronic pain syndrome - continue cyclobenzaprine 10 mg 1 tab by mouth 3 times a day when necessary, oxycodone 10 mg 2 tabs =  20 mg 1 tab by mouth every 4 hours when necessary and Tylenol ES 500 mg 1 caplet by mouth every 6 hours when necessary for pain  Insomnia - continue eszopiclone 3 mg 1 tab by mouth daily at bedtime when necessary  Anxiety - mood is stable; continue Klonopin 1 mg 1 tab by mouth twice a day when necessary  Dementia - continue Aricept 10 mg 1 tab by mouth twice a day  Depression - continue Zoloft 100 mg 1 tab by mouth daily  Neuropathy - continue Gralise ER 600 mg take 5 tabs by mouth daily at bedtime  Headache - continue Keppra 1000 mg 1 tab by mouth twice a day and Maxalt 10 mg 1 tab by mouth when necessary and may repeat in 2 hours if no relief  Protein calorie malnutrition - continue Procel 2 scoops by mouth twice a day       I have filled out patient's discharge paperwork and written prescriptions.  Patient will receive home health PT.  DME provided:  Standard wheelchair 16" X 16" ultralight weight quick release wheels, swing away leg and arm rests, ground reaction AFO right foot, right static wrist support brace, universal size  Total discharge time: Greater than 30 minutes  Discharge time involved coordination of the discharge process with Child psychotherapist, nursing staff and therapy department. Medical justification for home health services/DME verified.     [facility], NP [employer] [phone]

## 2015-04-19 DIAGNOSIS — L03115 Cellulitis of right lower limb: Secondary | ICD-10-CM | POA: Diagnosis not present

## 2015-04-19 DIAGNOSIS — G834 Cauda equina syndrome: Secondary | ICD-10-CM | POA: Diagnosis not present

## 2015-04-19 DIAGNOSIS — M6249 Contracture of muscle, multiple sites: Secondary | ICD-10-CM | POA: Diagnosis not present

## 2015-04-19 DIAGNOSIS — S343XXD Injury of cauda equina, subsequent encounter: Secondary | ICD-10-CM | POA: Diagnosis not present

## 2015-04-19 DIAGNOSIS — G8929 Other chronic pain: Secondary | ICD-10-CM | POA: Diagnosis not present

## 2015-04-19 DIAGNOSIS — M6281 Muscle weakness (generalized): Secondary | ICD-10-CM | POA: Diagnosis not present

## 2015-04-19 DIAGNOSIS — M545 Low back pain: Secondary | ICD-10-CM | POA: Diagnosis not present

## 2015-04-19 DIAGNOSIS — M4326 Fusion of spine, lumbar region: Secondary | ICD-10-CM | POA: Diagnosis not present

## 2015-04-19 DIAGNOSIS — Z5189 Encounter for other specified aftercare: Secondary | ICD-10-CM | POA: Diagnosis not present

## 2015-04-19 DIAGNOSIS — R2681 Unsteadiness on feet: Secondary | ICD-10-CM | POA: Diagnosis not present

## 2015-04-20 DIAGNOSIS — G8929 Other chronic pain: Secondary | ICD-10-CM | POA: Diagnosis not present

## 2015-04-20 DIAGNOSIS — S31000A Unspecified open wound of lower back and pelvis without penetration into retroperitoneum, initial encounter: Secondary | ICD-10-CM | POA: Insufficient documentation

## 2015-04-20 DIAGNOSIS — M545 Low back pain: Secondary | ICD-10-CM | POA: Diagnosis not present

## 2015-04-20 DIAGNOSIS — S343XXD Injury of cauda equina, subsequent encounter: Secondary | ICD-10-CM | POA: Diagnosis not present

## 2015-04-20 DIAGNOSIS — G834 Cauda equina syndrome: Secondary | ICD-10-CM | POA: Diagnosis not present

## 2015-04-20 DIAGNOSIS — M6249 Contracture of muscle, multiple sites: Secondary | ICD-10-CM | POA: Diagnosis not present

## 2015-04-20 DIAGNOSIS — M6281 Muscle weakness (generalized): Secondary | ICD-10-CM | POA: Diagnosis not present

## 2015-04-20 DIAGNOSIS — R2681 Unsteadiness on feet: Secondary | ICD-10-CM | POA: Diagnosis not present

## 2015-04-20 DIAGNOSIS — L03115 Cellulitis of right lower limb: Secondary | ICD-10-CM | POA: Diagnosis not present

## 2015-04-20 DIAGNOSIS — M4326 Fusion of spine, lumbar region: Secondary | ICD-10-CM | POA: Diagnosis not present

## 2015-04-20 DIAGNOSIS — Z5189 Encounter for other specified aftercare: Secondary | ICD-10-CM | POA: Diagnosis not present

## 2015-04-20 NOTE — Progress Notes (Signed)
Patient ID: Jared Li, male   DOB: 07-08-1965, 50 y.o.   MRN: [id number]    DATE:  04/05/15  MRN:  [id number]  BIRTHDAY: May 12, 1965  Facility:  Nursing Home Location:  [facility]  Nursing Home Room Number: [id number]  LEVEL OF CARE:  SNF [phone]  Contact Information    Name Relation Home Work [place] Brother [phone]  [phone]   [place], [place] [phone]               Chief Complaint  Patient presents with  . Medical Management of Chronic Issues    Physical deconditioning, lumbar wound infection, chronic pain syndrome, neuropathy, chronic headache, insomnia, anxiety and dementia    HISTORY OF PRESENT ILLNESS:  This is a 50 year old male who is being seen for a routine visit. His Vancomycin was discontinued yesterday and PICC line has been discontinued, as well. He will follow-up with ID.   He has been admitted to [facility] on 03/13/15 from [facility] with right lower extremity cellulitis and lumbar wound infection.  He has been admitted for a short-term rehabilitation.  PAST MEDICAL HISTORY:  Past Medical History  Diagnosis Date  . Cellulitis of lower limb   . Edema   . Chronic pain   . Adjustment disorder   . Tobacco use disorder   . Chronic intractable headache   . Insomnia   . Physical deconditioning   . Open wound of lumbar region with complication   . Loose stools      CURRENT MEDICATIONS: Reviewed  Patient's Medications  New Prescriptions   OXYCODONE HCL 10 MG TABS    Take two tablets by mouth every 4 hours as needed for pain  Previous Medications   ACETAMINOPHEN (TYLENOL) 500 MG TABLET    Take 500 mg by mouth every 6 (six) hours as needed for mild pain.   ALBUTEROL SULFATE (PROAIR HFA IN)    Inhale 2 puffs into the lungs every 6 (six) hours as needed (SOB or wheezing).       CLONAZEPAM (KLONOPIN) 1 MG TABLET    Take 1 mg by mouth  daily as needed for anxiety.       DIPHENHYDRAMINE  (BENADRYL) 25 MG TABLET    Take 25 mg by mouth every 6 (six) hours as needed.   DONEPEZIL (ARICEPT) 10 MG TABLET    Take 10 mg by mouth twice a day   ESZOPICLONE 3 MG TABS    Take 3 mg by mouth at bedtime as needed. Take immediately before bedtime   GABAPENTIN, ONCE-DAILY, (GRALISE) 600 MG TABS    Take 600 mg by mouth. Five (5) tabs po QHS for pain   LEVETIRACETAM (KEPPRA) 1000 MG TABLET    Take 1,000 mg by mouth 2 (two) times daily.    PROMETHAZINE (PHENERGAN) 12.5 MG TABLET    Take 12.5 mg by mouth every 6 (six) hours as needed for nausea or vomiting.       RIZATRIPTAN (MAXALT) 10 MG TABLET    Take 10 mg by mouth as needed for migraine. May repeat in 2 hours if needed   SACCHAROMYCES BOULARDII (FLORASTOR) 250 MG CAPSULE    Take 250 mg by mouth 2 (two) times daily.   SERTRALINE (ZOLOFT) 100 MG TABLET    Take 100 mg by mouth daily.     Modified Medication Previous Medication   DRONABINOL (MARINOL) 5 MG CAPSULE dronabinol (MARINOL) 5 MG capsule      Take one  capsule by mouth twice daily before meals    Take one capsule by mouth twice daily before meals          Allergies  Allergen Reactions  . Adhesive [Tape]   . Codeine   . Morphine And Related     Itching      REVIEW OF SYSTEMS:  GENERAL: no change in appetite, no fatigue, no weight changes, no fever, chills or weakness EYES: Denies change in vision, dry eyes, eye pain, itching or discharge EARS: Denies change in hearing, ringing in ears, or earache NOSE: Denies nasal congestion or epistaxis MOUTH and THROAT: Denies oral discomfort, gingival pain or bleeding, pain from teeth or hoarseness   RESPIRATORY: no cough, SOB, DOE, wheezing, hemoptysis CARDIAC: no chest pain, edema or palpitations GI: no abdominal pain, diarrhea, constipation, heart burn, nausea or vomiting GU: Denies dysuria, frequency, hematuria, incontinence, or discharge PSYCHIATRIC: Denies feeling of depression or anxiety. No report of hallucinations, insomnia,  paranoia, or agitation   PHYSICAL EXAMINATION  GENERAL APPEARANCE: Well nourished. In no acute distress. Normal body habitus SKIN:  Surgical incision on lumbar area is dry, no erythema HEAD: Normal in size and contour. No evidence of trauma EYES: Lids open and close normally. No blepharitis, entropion or ectropion. PERRL. Conjunctivae are clear and sclerae are white. Lenses are without opacity EARS: Pinnae are normal. Patient hears normal voice tunes of the examiner MOUTH and THROAT: Lips are without lesions. Oral mucosa is moist and without lesions. Tongue is normal in shape, size, and color and without lesions NECK: supple, trachea midline, no neck masses, no thyroid tenderness, no thyromegaly LYMPHATICS: no LAN in the neck, no supraclavicular LAN RESPIRATORY: breathing is even & unlabored, BS CTAB CARDIAC: RRR, no murmur,no extra heart sounds, no edema GI: abdomen soft, normal BS, no masses, no tenderness, no hepatomegaly, no splenomegaly; has pain pump implant EXTREMITIES:  Able to move 4 extremities  PSYCHIATRIC: Alert and oriented X 3. Affect and behavior are appropriate  LABS/RADIOLOGY: Labs Reviewed: Basic Metabolic Panel:  Recent Labs  16/10/96    NA 134*    K 4.3    BUN 4    CREATININE 0.7     Liver Function Tests: Recent Labs  04/03/15    AST 16    ALT 15    ALKPHOS 103      CBC:  Recent Labs  04/03/15    WBC 9.8    NEUTROABS 7    HGB 13.7    HCT 42    PLT 211     03/08/15    sodium 137 potassium 4.3 glucose 88 BUN 3 creatinine 0.90 calcium 8.2 WBC 7.0 hemoglobin 14.7 hematocrit 44.4 MCV 99 platelet 156   ASSESSMENT/PLAN:  Physical deconditioning - continue rehabilitation   Lumbar wound infection -  Cipro, Vancomycin and Flagyl has been discontinued and will follow-up with ID  Chronic pain syndrome - continue Tylenol ES 500 mg 1 tab by mouth every 6 hours when necessary, oxycodone 20 mg every 4 hours when necessary, Marinol 5 mg 1 capsule twice a  day  Chronic headache - continue Keppra 1000 mg daily, Maxalt 10 mg 1 tab by mouth when necessary and may repeat in 2 hours if no relief  Insomnia - continue Eszopiclone 3 mg daily at bedtime  Anxiety - mood is stable; continue Klonopin 1 mg 1 tab twice a day when necessary  Dementia - continue Aricept to 10 mg 1 tab changed back to BID  Depression - continue  Zoloft 100 mg 1 tab daily  Neuropathy - continue Gralise 600 mg ER take 5 tabs by mouth daily at bedtime     Goals of care:  Short-term rehabilitation     [facility], NP [facility] [phone]

## 2015-04-20 NOTE — Progress Notes (Signed)
Patient ID: Jared Li, male   DOB: [birth year], 50 y.o.   MRN: [id number]    DATE:  03/14/15  MRN:  [id number]  BIRTHDAY: 1965-10-19  Facility:  Nursing Home Location:  [facility]  Nursing Home Room Number: [id number]  LEVEL OF CARE:  SNF [phone])  Contact Information    Name Relation Home Work [place] Brother [phone]  [phone]   [place], [place] [phone]         Code Status History    This patient does not have a recorded code status. Please follow your organizational policy for patients in this situation.       Chief Complaint  Patient presents with  . Hospitalization Follow-up    Physical deconditioning, lumbar wound infection, RLE cellulitis, chronic pain syndrome, peripheral neuropathy, chronic headache, insomnia, anxiety, dementia and depression    HISTORY OF PRESENT ILLNESS:  This is a 49 year old male who was been admitted to [facility] on 03/13/15 from [facility] with right lower extremity cellulitis and lumbar wound infection. He was seen by ID and is currently on vancomycin, Cipro and Flagyl. He has been admitted for a short-term rehabilitation.  PAST MEDICAL HISTORY:  Past Medical History  Diagnosis Date  . Cellulitis of lower limb   . Edema   . Chronic pain   . Adjustment disorder   . Tobacco use disorder   . Chronic intractable headache   . Insomnia   . Physical deconditioning   . Open wound of lumbar region with complication   . Loose stools      CURRENT MEDICATIONS: Reviewed  Patient's Medications  New Prescriptions   OXYCODONE HCL 10 MG TABS    Take two tablets by mouth every 4 hours as needed for pain  Previous Medications   ACETAMINOPHEN (TYLENOL) 500 MG TABLET    Take 500 mg by mouth every 6 (six) hours as needed for mild pain.   ALBUTEROL SULFATE (PROAIR HFA IN)    Inhale 2 puffs into the lungs every 6 (six) hours as needed (SOB or wheezing).    METRONIDAZOLE 500 MG  TABLET  VANCOMYCIN 1.5 MG IV                                                  CIPRO 500 MG  Take 500 mg 1 tab by mouth twice a day till 03/23/15 Take 1.5 g IV every 12 hours no stop date  Take 500 mg 1 tab by mouth BID till 03/23/15   CLONAZEPAM (KLONOPIN) 1 MG TABLET    Take 1 mg by mouth 2 (two) times daily as needed for anxiety.   CYCLOBENZAPRINE (FLEXERIL) 10 MG TABLET    Take 10 mg by mouth 3 (three) times daily as needed for muscle spasms.   DIPHENHYDRAMINE (BENADRYL) 25 MG TABLET    Take 25 mg by mouth every 6 (six) hours as needed.   DONEPEZIL (ARICEPT) 10 MG TABLET    Take 10 mg by mouth 2 (two) times daily.   ESZOPICLONE 3 MG TABS    Take 3 mg by mouth at bedtime as needed. Take immediately before bedtime   GABAPENTIN, ONCE-DAILY, (GRALISE) 600 MG TABS    Take 600 mg by mouth. Five (5) tabs po QHS for pain   LEVETIRACETAM (KEPPRA) 1000 MG TABLET    Take  1,000 mg by mouth 2 (two) times daily.    PROMETHAZINE (PHENERGAN) 12.5 MG TABLET    Take 12.5 mg by mouth every 6 (six) hours as needed for nausea or vomiting.       RIZATRIPTAN (MAXALT) 10 MG TABLET    Take 10 mg by mouth as needed for migraine. May repeat in 2 hours if needed   SACCHAROMYCES BOULARDII (FLORASTOR) 250 MG CAPSULE    Take 250 mg by mouth 2 (two) times daily.   SERTRALINE (ZOLOFT) 100 MG TABLET    Take 100 mg by mouth daily.     Modified Medication Previous Medication   DRONABINOL (MARINOL) 5 MG CAPSULE dronabinol (MARINOL) 5 MG capsule      Take one capsule by mouth twice daily before meals    Take one capsule by mouth twice daily before meals     CLONAZEPAM (KLONOPIN) 1 MG TABLET    Take 1 tablet (1 mg total) by mouth 2 (two) times daily as needed for anxiety.    Allergies  Allergen Reactions  . Adhesive [Tape]   . Codeine   . Morphine And Related     Itching      REVIEW OF SYSTEMS:  GENERAL: no change in appetite, no fatigue, no weight changes, no fever, chills or weakness EYES: Denies change in vision,  dry eyes, eye pain, itching or discharge EARS: Denies change in hearing, ringing in ears, or earache NOSE: Denies nasal congestion or epistaxis MOUTH and THROAT: Denies oral discomfort, gingival pain or bleeding, pain from teeth or hoarseness   RESPIRATORY: no cough, SOB, DOE, wheezing, hemoptysis CARDIAC: no chest pain, edema or palpitations GI: no abdominal pain, diarrhea, constipation, heart burn, nausea or vomiting GU: Denies dysuria, frequency, hematuria, incontinence, or discharge PSYCHIATRIC: Denies feeling of depression or anxiety. No report of hallucinations, insomnia, paranoia, or agitation   PHYSICAL EXAMINATION  GENERAL APPEARANCE: Well nourished. In no acute distress. Normal body habitus SKIN:  Surgical incision on lumbar area is dry, no erythema HEAD: Normal in size and contour. No evidence of trauma EYES: Lids open and close normally. No blepharitis, entropion or ectropion. PERRL. Conjunctivae are clear and sclerae are white. Lenses are without opacity EARS: Pinnae are normal. Patient hears normal voice tunes of the examiner MOUTH and THROAT: Lips are without lesions. Oral mucosa is moist and without lesions. Tongue is normal in shape, size, and color and without lesions NECK: supple, trachea midline, no neck masses, no thyroid tenderness, no thyromegaly LYMPHATICS: no LAN in the neck, no supraclavicular LAN RESPIRATORY: breathing is even & unlabored, BS CTAB CARDIAC: RRR, no murmur,no extra heart sounds, no edema GI: abdomen soft, normal BS, no masses, no tenderness, no hepatomegaly, no splenomegaly; has pain pump implant EXTREMITIES:  Able to move 4 extremities ; LUE PICC PSYCHIATRIC: Alert and oriented X 3. Affect and behavior are appropriate  LABS/RADIOLOGY: Labs Reviewed: 03/08/15    sodium 137 potassium 4.3 glucose 88 BUN 3 creatinine 0.90 calcium 8.2 WBC 7.0 hemoglobin 14.7 hematocrit 44.4 MCV 99 platelet 156   ASSESSMENT/PLAN:  Physical deconditioning - for  rehabilitation   Lumbar wound infection - continue Cipro 500 mg 1 tab twice a day and Flagyl 500 mg 1 tab by mouth twice a day till 03/23/15, vancomycin 1.5 g IV every 12 hours no stop date; will follow-up with ID  RLE cellulitis - resolved  Chronic pain syndrome - continue Tylenol ES 500 mg 1 tab by mouth every 6 hours when necessary, oxycodone 20 mg  every 4 hours when necessary, Marinol 5 mg 1 capsule twice a day  Chronic headache - continue Keppra 1000 mg daily, Maxalt 10 mg 1 tab by mouth when necessary and may repeat in 2 hours if no relief  Insomnia - continue Eszopiclone 3 mg daily at bedtime  Anxiety - mood is stable; continue Klonopin 1 mg 1 tab twice a day when necessary  Dementia - decrease Aricept to 10 mg 1 tab daily instead of twice a day  Depression - continue Zoloft 100 mg 1 tab daily  Neuropathy - continue Gralise 600 mg ER take 5 tabs by mouth daily at bedtime     Goals of care:  Short-term rehabilitation     [facility], NP [facility] [phone]

## 2015-05-30 ENCOUNTER — Other Ambulatory Visit: Payer: Self-pay | Admitting: Adult Health

## 2015-06-02 DIAGNOSIS — L03311 Cellulitis of abdominal wall: Secondary | ICD-10-CM | POA: Diagnosis not present

## 2015-06-02 DIAGNOSIS — G8929 Other chronic pain: Secondary | ICD-10-CM | POA: Diagnosis not present

## 2015-06-02 DIAGNOSIS — J45909 Unspecified asthma, uncomplicated: Secondary | ICD-10-CM | POA: Diagnosis not present

## 2015-06-02 DIAGNOSIS — R109 Unspecified abdominal pain: Secondary | ICD-10-CM | POA: Diagnosis not present

## 2015-06-02 DIAGNOSIS — M9973 Connective tissue and disc stenosis of intervertebral foramina of lumbar region: Secondary | ICD-10-CM | POA: Diagnosis not present

## 2015-06-02 DIAGNOSIS — K4090 Unilateral inguinal hernia, without obstruction or gangrene, not specified as recurrent: Secondary | ICD-10-CM | POA: Diagnosis not present

## 2015-06-02 DIAGNOSIS — Z91048 Other nonmedicinal substance allergy status: Secondary | ICD-10-CM | POA: Diagnosis not present

## 2015-06-02 DIAGNOSIS — R509 Fever, unspecified: Secondary | ICD-10-CM | POA: Diagnosis not present

## 2015-06-02 DIAGNOSIS — Z885 Allergy status to narcotic agent status: Secondary | ICD-10-CM | POA: Diagnosis not present

## 2015-06-02 DIAGNOSIS — R590 Localized enlarged lymph nodes: Secondary | ICD-10-CM | POA: Diagnosis not present

## 2015-06-02 DIAGNOSIS — F17210 Nicotine dependence, cigarettes, uncomplicated: Secondary | ICD-10-CM | POA: Diagnosis not present

## 2015-06-02 DIAGNOSIS — R1032 Left lower quadrant pain: Secondary | ICD-10-CM | POA: Diagnosis not present

## 2015-06-02 DIAGNOSIS — R05 Cough: Secondary | ICD-10-CM | POA: Diagnosis not present

## 2015-06-02 DIAGNOSIS — M545 Low back pain: Secondary | ICD-10-CM | POA: Diagnosis not present

## 2015-06-02 DIAGNOSIS — Z79899 Other long term (current) drug therapy: Secondary | ICD-10-CM | POA: Diagnosis not present

## 2015-06-02 DIAGNOSIS — Z886 Allergy status to analgesic agent status: Secondary | ICD-10-CM | POA: Diagnosis not present

## 2015-06-02 DIAGNOSIS — Z79891 Long term (current) use of opiate analgesic: Secondary | ICD-10-CM | POA: Diagnosis not present

## 2015-06-07 DIAGNOSIS — T85738A Infection and inflammatory reaction due to other nervous system device, implant or graft, initial encounter: Secondary | ICD-10-CM | POA: Insufficient documentation

## 2015-06-07 DIAGNOSIS — Z9689 Presence of other specified functional implants: Secondary | ICD-10-CM | POA: Diagnosis not present

## 2015-06-07 DIAGNOSIS — M961 Postlaminectomy syndrome, not elsewhere classified: Secondary | ICD-10-CM | POA: Diagnosis not present

## 2015-06-07 DIAGNOSIS — G894 Chronic pain syndrome: Secondary | ICD-10-CM | POA: Diagnosis not present

## 2015-06-22 DIAGNOSIS — J45909 Unspecified asthma, uncomplicated: Secondary | ICD-10-CM | POA: Diagnosis present

## 2015-06-22 DIAGNOSIS — R109 Unspecified abdominal pain: Secondary | ICD-10-CM | POA: Diagnosis not present

## 2015-06-22 DIAGNOSIS — F419 Anxiety disorder, unspecified: Secondary | ICD-10-CM | POA: Diagnosis present

## 2015-06-22 DIAGNOSIS — T85738A Infection and inflammatory reaction due to other nervous system device, implant or graft, initial encounter: Secondary | ICD-10-CM | POA: Diagnosis not present

## 2015-06-22 DIAGNOSIS — M549 Dorsalgia, unspecified: Secondary | ICD-10-CM | POA: Diagnosis not present

## 2015-06-22 DIAGNOSIS — T85738D Infection and inflammatory reaction due to other nervous system device, implant or graft, subsequent encounter: Secondary | ICD-10-CM | POA: Diagnosis not present

## 2015-06-22 DIAGNOSIS — A498 Other bacterial infections of unspecified site: Secondary | ICD-10-CM | POA: Diagnosis not present

## 2015-06-22 DIAGNOSIS — F17210 Nicotine dependence, cigarettes, uncomplicated: Secondary | ICD-10-CM | POA: Diagnosis present

## 2015-06-22 DIAGNOSIS — Z886 Allergy status to analgesic agent status: Secondary | ICD-10-CM | POA: Diagnosis not present

## 2015-06-22 DIAGNOSIS — Z79899 Other long term (current) drug therapy: Secondary | ICD-10-CM | POA: Diagnosis not present

## 2015-06-22 DIAGNOSIS — Z91048 Other nonmedicinal substance allergy status: Secondary | ICD-10-CM | POA: Diagnosis not present

## 2015-07-10 DIAGNOSIS — A498 Other bacterial infections of unspecified site: Secondary | ICD-10-CM | POA: Insufficient documentation

## 2015-07-21 DIAGNOSIS — B372 Candidiasis of skin and nail: Secondary | ICD-10-CM | POA: Insufficient documentation

## 2015-10-20 ENCOUNTER — Other Ambulatory Visit: Payer: Self-pay | Admitting: Adult Health

## 2016-03-14 ENCOUNTER — Other Ambulatory Visit: Payer: Self-pay | Admitting: Adult Health

## 2016-03-19 ENCOUNTER — Other Ambulatory Visit: Payer: Self-pay | Admitting: Adult Health

## 2016-04-04 ENCOUNTER — Other Ambulatory Visit: Payer: Self-pay | Admitting: Adult Health

## 2016-05-21 DIAGNOSIS — Z9889 Other specified postprocedural states: Secondary | ICD-10-CM | POA: Insufficient documentation

## 2016-05-31 ENCOUNTER — Other Ambulatory Visit: Payer: Self-pay | Admitting: Orthopedic Surgery

## 2016-06-03 DIAGNOSIS — F321 Major depressive disorder, single episode, moderate: Secondary | ICD-10-CM | POA: Diagnosis not present

## 2016-06-03 DIAGNOSIS — G834 Cauda equina syndrome: Secondary | ICD-10-CM | POA: Diagnosis not present

## 2016-06-03 DIAGNOSIS — R3911 Hesitancy of micturition: Secondary | ICD-10-CM | POA: Diagnosis not present

## 2016-06-03 DIAGNOSIS — Z6826 Body mass index (BMI) 26.0-26.9, adult: Secondary | ICD-10-CM | POA: Diagnosis not present

## 2016-06-03 DIAGNOSIS — K219 Gastro-esophageal reflux disease without esophagitis: Secondary | ICD-10-CM | POA: Diagnosis not present

## 2016-06-03 DIAGNOSIS — Z0000 Encounter for general adult medical examination without abnormal findings: Secondary | ICD-10-CM | POA: Diagnosis not present

## 2016-06-03 DIAGNOSIS — G894 Chronic pain syndrome: Secondary | ICD-10-CM | POA: Diagnosis not present

## 2016-06-03 DIAGNOSIS — K210 Gastro-esophageal reflux disease with esophagitis: Secondary | ICD-10-CM | POA: Diagnosis not present

## 2016-06-03 DIAGNOSIS — Z79899 Other long term (current) drug therapy: Secondary | ICD-10-CM | POA: Diagnosis not present

## 2016-06-03 DIAGNOSIS — F411 Generalized anxiety disorder: Secondary | ICD-10-CM | POA: Diagnosis not present

## 2016-06-03 DIAGNOSIS — Z125 Encounter for screening for malignant neoplasm of prostate: Secondary | ICD-10-CM | POA: Diagnosis not present

## 2016-06-03 DIAGNOSIS — R3915 Urgency of urination: Secondary | ICD-10-CM | POA: Diagnosis not present

## 2016-06-04 ENCOUNTER — Encounter (HOSPITAL_COMMUNITY)
Admission: RE | Admit: 2016-06-04 | Discharge: 2016-06-04 | Disposition: A | Discharge status: Home / Self Care | Payer: Worker's Compensation | Source: Ambulatory Visit | Attending: Orthopedic Surgery | Admitting: Orthopedic Surgery

## 2016-06-04 ENCOUNTER — Encounter (HOSPITAL_COMMUNITY): Payer: Self-pay

## 2016-06-04 ENCOUNTER — Ambulatory Visit (HOSPITAL_COMMUNITY)
Admission: RE | Admit: 2016-06-04 | Discharge: 2016-06-04 | Disposition: A | Discharge status: Home / Self Care | Payer: Self-pay | Source: Ambulatory Visit | Attending: Orthopedic Surgery | Admitting: Orthopedic Surgery

## 2016-06-04 DIAGNOSIS — Z01810 Encounter for preprocedural cardiovascular examination: Secondary | ICD-10-CM | POA: Diagnosis present

## 2016-06-04 DIAGNOSIS — I498 Other specified cardiac arrhythmias: Secondary | ICD-10-CM | POA: Diagnosis not present

## 2016-06-04 DIAGNOSIS — M542 Cervicalgia: Secondary | ICD-10-CM | POA: Insufficient documentation

## 2016-06-04 DIAGNOSIS — Z01812 Encounter for preprocedural laboratory examination: Secondary | ICD-10-CM | POA: Insufficient documentation

## 2016-06-04 DIAGNOSIS — M4322 Fusion of spine, cervical region: Secondary | ICD-10-CM | POA: Insufficient documentation

## 2016-06-04 DIAGNOSIS — Z01818 Encounter for other preprocedural examination: Secondary | ICD-10-CM

## 2016-06-04 HISTORY — DX: Personal history of urinary calculi: Z87.442

## 2016-06-04 HISTORY — DX: Gastro-esophageal reflux disease without esophagitis: K21.9

## 2016-06-04 HISTORY — DX: Radiculopathy, cervical region: M54.12

## 2016-06-04 HISTORY — DX: Cauda equina syndrome: G83.4

## 2016-06-04 HISTORY — DX: Bacteremia: R78.81

## 2016-06-04 HISTORY — DX: Unspecified asthma, uncomplicated: J45.909

## 2016-06-04 HISTORY — DX: Unspecified osteoarthritis, unspecified site: M19.90

## 2016-06-04 HISTORY — DX: Methicillin resistant Staphylococcus aureus infection as the cause of diseases classified elsewhere: B95.62

## 2016-06-04 HISTORY — DX: Postlaminectomy syndrome, not elsewhere classified: M96.1

## 2016-06-04 LAB — CBC WITH DIFFERENTIAL/PLATELET
BASOS PCT: 0 %
Basophils Absolute: 0 10*3/uL (ref 0.0–0.1)
EOS ABS: 0.1 10*3/uL (ref 0.0–0.7)
EOS PCT: 1 %
HCT: 48.2 % (ref 39.0–52.0)
HEMOGLOBIN: 16.4 g/dL (ref 13.0–17.0)
Lymphocytes Relative: 22 %
Lymphs Abs: 1.5 10*3/uL (ref 0.7–4.0)
MCH: 32.7 pg (ref 26.0–34.0)
MCHC: 34 g/dL (ref 30.0–36.0)
MCV: 96 fL (ref 78.0–100.0)
Monocytes Absolute: 0.5 10*3/uL (ref 0.1–1.0)
Monocytes Relative: 7 %
NEUTROS PCT: 70 %
Neutro Abs: 4.8 10*3/uL (ref 1.7–7.7)
PLATELETS: 153 10*3/uL (ref 150–400)
RBC: 5.02 MIL/uL (ref 4.22–5.81)
RDW: 13.2 % (ref 11.5–15.5)
WBC: 6.8 10*3/uL (ref 4.0–10.5)

## 2016-06-04 LAB — COMPREHENSIVE METABOLIC PANEL
ALBUMIN: 4.3 g/dL (ref 3.5–5.0)
ALK PHOS: 109 U/L (ref 38–126)
ALT: 44 U/L (ref 17–63)
AST: 30 U/L (ref 15–41)
Anion gap: 11 (ref 5–15)
BUN: 8 mg/dL (ref 6–20)
CHLORIDE: 99 mmol/L — AB (ref 101–111)
CO2: 26 mmol/L (ref 22–32)
Calcium: 9.3 mg/dL (ref 8.9–10.3)
Creatinine, Ser: 0.87 mg/dL (ref 0.61–1.24)
GFR calc non Af Amer: >60 mL/min (ref >60)
GLUCOSE: 92 mg/dL (ref 65–99)
Potassium: 4.4 mmol/L (ref 3.5–5.1)
SODIUM: 136 mmol/L (ref 135–145)
Total Bilirubin: 0.8 mg/dL (ref 0.3–1.2)
Total Protein: 7.1 g/dL (ref 6.5–8.1)

## 2016-06-04 LAB — TYPE AND SCREEN
ABO/RH(D): O POS
Antibody Screen: NEGATIVE

## 2016-06-04 LAB — URINALYSIS, ROUTINE W REFLEX MICROSCOPIC
BILIRUBIN URINE: NEGATIVE
GLUCOSE, UA: NEGATIVE mg/dL
HGB URINE DIPSTICK: NEGATIVE
KETONES UR: 5 mg/dL — AB
Leukocytes, UA: NEGATIVE
Nitrite: NEGATIVE
PROTEIN: NEGATIVE mg/dL
Specific Gravity, Urine: 1.013 (ref 1.005–1.030)
pH: 8 (ref 5.0–8.0)

## 2016-06-04 LAB — PROTIME-INR
INR: 0.99
Prothrombin Time: 13.1 seconds (ref 11.4–15.2)

## 2016-06-04 LAB — SURGICAL PCR SCREEN
MRSA, PCR: NEGATIVE
Staphylococcus aureus: NEGATIVE

## 2016-06-04 LAB — APTT: APTT: 29 s (ref 24–36)

## 2016-06-04 LAB — ABO/RH: ABO/RH(D): O POS

## 2016-06-04 NOTE — Pre-Procedure Instructions (Signed)
    Axel FillerCharles Hefel  06/04/2016    Your procedure is scheduled on Thursday, March 29.  Report to [facility] at 9:10 A.M.                  Your surgery or procedure is scheduled for 12:10 PM   Call this number if you have problems the morning of surgery: [phone]    For any other questions, please call [phone], Monday - Friday 8 AM - 4 PM.   Remember:  Do not eat food or drink liquids after midnight Wednesday, March 28.  Take these medicines the morning of surgery with A SIP OF WATER: May take pain medication medication for muscle spasms, nausea mediation if needed.               1 Week prior to surgery STOP taking Aspirin, Aspirin Products (Goody Powder, Excedrin Migraine), Ibuprofen (Advil), Naproxen (Aleve), Vitamin and Herbal Products (ie Fish Oil)   Do not wear jewelry, make-up or nail polish.  Do not wear lotions, powders, or perfumes, or deodorant.  Do not shave 48 hours prior to surgery.  Men may shave face and neck.  Do not bring valuables to the hospital.  [facility] is not responsible for any belongings or valuables.  Contacts, dentures or bridgework may not be worn into surgery.  Leave your suitcase in the car.  After surgery it may be brought to your room.  For patients admitted to the hospital, discharge time will be determined by your treatment team.  Patients discharged the day of surgery will not be allowed to drive home.   Special instructions: Review  [place] - Preparing For Surgery.  Please read over the following fact sheets that you were given: [facility]- Preparing For [facility] and Patient Instructions for Mupirocin Application, Incentive Spirometry, Pain Booklet

## 2016-06-04 NOTE — Progress Notes (Signed)
Mr. Jared Li has has multiple back surgeries and fusions since he was injured in 2007.  Patient was living in [place] area until 3 years ago, he now lives with his brother and sister- in - law in [place].   I faxed a request to [facility] in [place] and asked for records.

## 2016-06-06 DIAGNOSIS — G834 Cauda equina syndrome: Secondary | ICD-10-CM | POA: Diagnosis not present

## 2016-06-06 DIAGNOSIS — R3915 Urgency of urination: Secondary | ICD-10-CM | POA: Diagnosis not present

## 2016-06-06 DIAGNOSIS — F411 Generalized anxiety disorder: Secondary | ICD-10-CM | POA: Diagnosis not present

## 2016-06-06 DIAGNOSIS — M4322 Fusion of spine, cervical region: Secondary | ICD-10-CM | POA: Diagnosis not present

## 2016-06-06 DIAGNOSIS — K219 Gastro-esophageal reflux disease without esophagitis: Secondary | ICD-10-CM | POA: Diagnosis not present

## 2016-06-06 DIAGNOSIS — F321 Major depressive disorder, single episode, moderate: Secondary | ICD-10-CM | POA: Diagnosis not present

## 2016-06-06 DIAGNOSIS — E46 Unspecified protein-calorie malnutrition: Secondary | ICD-10-CM | POA: Diagnosis not present

## 2016-06-06 DIAGNOSIS — R3911 Hesitancy of micturition: Secondary | ICD-10-CM | POA: Diagnosis not present

## 2016-06-10 ENCOUNTER — Ambulatory Visit: Payer: Self-pay | Admitting: Plastic Surgery

## 2016-06-10 DIAGNOSIS — S1190XA Unspecified open wound of unspecified part of neck, initial encounter: Secondary | ICD-10-CM

## 2016-06-12 NOTE — H&P (Signed)
PREOPERATIVE H&P  Chief Complaint: Neck pain  HPI: Jared Li is a 51 y.o. male who presents with ongoing pain in the neck. Patient is s/p a previous ACDF and a posterior cervical fusion, but patient does have a nonunion and malpositioned hardware noted on his cervical CT scan, likely causing ongoing pain    Patient has failed multiple forms of conservative care and continues to have pain (see office notes for additional details regarding the patient's full course of treatment)  Past Medical History:  Diagnosis Date  . Adjustment disorder   . Arthritis   . Asthma   . Cauda equina syndrome ([place])   . Cellulitis of lower limb   . Cervical radiculopathy   . Chronic intractable headache    Mirgraines- lst one 06/02/16  . Chronic pain   . Edema   . GERD (gastroesophageal reflux disease)   . History of kidney stones    passed  . Insomnia   . Loose stools   . Lumbar post-laminectomy syndrome   . MRSA bacteremia   . Open wound of lumbar region with complication   . Physical deconditioning   . Tobacco use disorder    Past Surgical History:  Procedure Laterality Date  . CARPAL TUNNEL RELEASE Bilateral   . CERVICAL FUSION     Numerous  . LUMBAR FUSION     Numerous  . Pain Pump insertion     x 2  . POSTERIOR LAMINECTOMY / DECOMPRESSION LUMBAR SPINE    . removal of pain pump     x 2  . Spinal Card Stimulator Removal      x 2  . SPINAL CORD STIMULATOR INSERTION      x2   Social History   Social History  . Marital status: Divorced    Spouse name: N/A  . Number of children: N/A  . Years of education: N/A   Social History Main Topics  . Smoking status: Former Smoker    Years: 35.00    Types: Cigarettes    Quit date: 06/02/2016  . Smokeless tobacco: Former Systems developer     Comment: used chewing tabacco as a child  . Alcohol use No     Comment: occasional  . Drug use: No  . Sexual activity: Not on file   Other Topics Concern  . Not on file   Social History  Narrative  . No narrative on file   No family history on file. Allergies  Allergen Reactions  . Adhesive [Tape] Other (See Comments)    Skin blisters  . Codeine Nausea And Vomiting  . Morphine And Related     Itching    Prior to Admission medications   Medication Sig Start Date End Date Taking? Authorizing Provider  acetaminophen (TYLENOL) 500 MG tablet Take 1,000 mg by mouth every 6 (six) hours as needed for mild pain.    Yes Historical Provider, MD  albuterol (PROVENTIL HFA;VENTOLIN HFA) 108 (90 Base) MCG/ACT inhaler Inhale 2 puffs into the lungs every 6 (six) hours as needed for wheezing or shortness of breath.   Yes Historical Provider, MD  diphenhydrAMINE (BENADRYL) 25 MG tablet Take 25 mg by mouth every 6 (six) hours as needed for itching.    Yes Historical Provider, MD  dronabinol (MARINOL) 5 MG capsule Take one capsule by mouth twice daily before meals Patient taking differently: Take 5 mg by mouth 3 (three) times daily before meals.  04/14/15  Yes Monica Carter, DO  GRALISE 600  MG TABS Take 3,000 mg by mouth daily with supper. Takes 5 tablets at dinner 05/04/16  Yes Historical Provider, MD  HYDROmorphone HCl 16 MG T24A Take 16 mg by mouth 2 (two) times daily. 05/12/16  Yes Historical Provider, MD  imipramine (TOFRANIL) 10 MG tablet Take 10 mg by mouth at bedtime.   Yes Historical Provider, MD  Multiple Vitamin (MULTIVITAMIN WITH MINERALS) TABS tablet Take 1 tablet by mouth daily.   Yes Historical Provider, MD  Nutritional Supplements (ENSURE HIGH PROTEIN PO) Take 1 Can by mouth 4 (four) times daily.   Yes Historical Provider, MD  ondansetron (ZOFRAN) 4 MG tablet Take 4 mg by mouth every 8 (eight) hours as needed for nausea or vomiting.   Yes Historical Provider, MD  Oxycodone HCl 10 MG TABS Take two tablets by mouth every 4 hours as needed for pain Patient taking differently: 15 mg every 6 (six) hours as needed. Take two tablets by mouth every 4 hours as needed for pain 03/21/15  Yes  Lauree Chandler, NP  oxyCODONE-acetaminophen (PERCOCET) 10-325 MG tablet Take 1 tablet by mouth every 6 (six) hours as needed for pain. for pain 04/30/16  Yes Historical Provider, MD  rizatriptan (MAXALT) 10 MG tablet Take 10 mg by mouth every 2 (two) hours as needed for migraine. May repeat in 2 hours if needed    Yes Historical Provider, MD  sertraline (ZOLOFT) 100 MG tablet Take 100 mg by mouth at bedtime. Takes 2 tablets   Yes Historical Provider, MD  Simethicone (GAS-X PO) Take 2 tablets by mouth 2 (two) times daily as needed (gas).   Yes Historical Provider, MD  tiZANidine (ZANAFLEX) 4 MG tablet Take 4 mg by mouth every 8 (eight) hours as needed for muscle spasms.   Yes Historical Provider, MD  vitamin C (ASCORBIC ACID) 500 MG tablet Take 500 mg by mouth 2 (two) times daily.   Yes Historical Provider, MD  zinc sulfate 220 (50 Zn) MG capsule Take 220 mg by mouth daily.   Yes Historical Provider, MD  FLUoxetine (PROZAC) 10 MG tablet Take 100 mg by mouth at bedtime.     Historical Provider, MD  omeprazole (PRILOSEC OTC) 20 MG tablet Take 20 mg by mouth daily as needed.    Historical Provider, MD     All other systems have been reviewed and were otherwise negative with the exception of those mentioned in the HPI and as above.  Physical Exam: There were no vitals filed for this visit.  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

## 2016-06-13 ENCOUNTER — Inpatient Hospital Stay (HOSPITAL_COMMUNITY)
Admission: RE | Admit: 2016-06-13 | Discharge: 2016-06-15 | DRG: 472 | Disposition: A | Discharge status: Home / Self Care | Payer: Worker's Compensation | Source: Ambulatory Visit | Attending: Orthopedic Surgery | Admitting: Orthopedic Surgery

## 2016-06-13 ENCOUNTER — Ambulatory Visit
Admission: RE | Admit: 2016-06-13 | Discharge: 2016-06-13 | Disposition: A | Discharge status: Home / Self Care | Payer: Self-pay | Source: Ambulatory Visit | Attending: Orthopedic Surgery | Admitting: Orthopedic Surgery

## 2016-06-13 ENCOUNTER — Inpatient Hospital Stay (HOSPITAL_COMMUNITY): Payer: Worker's Compensation | Admitting: Certified Registered Nurse Anesthetist

## 2016-06-13 ENCOUNTER — Inpatient Hospital Stay (HOSPITAL_COMMUNITY): Payer: Worker's Compensation

## 2016-06-13 ENCOUNTER — Encounter (HOSPITAL_COMMUNITY)
Admission: RE | Disposition: A | Discharge status: Home / Self Care | Payer: Self-pay | Source: Ambulatory Visit | Attending: Orthopedic Surgery

## 2016-06-13 ENCOUNTER — Other Ambulatory Visit (HOSPITAL_COMMUNITY): Payer: Self-pay | Admitting: Orthopedic Surgery

## 2016-06-13 ENCOUNTER — Encounter (HOSPITAL_COMMUNITY): Payer: Self-pay | Admitting: *Deleted

## 2016-06-13 DIAGNOSIS — R52 Pain, unspecified: Secondary | ICD-10-CM

## 2016-06-13 DIAGNOSIS — M6208 Separation of muscle (nontraumatic), other site: Secondary | ICD-10-CM | POA: Diagnosis present

## 2016-06-13 DIAGNOSIS — F4320 Adjustment disorder, unspecified: Secondary | ICD-10-CM | POA: Diagnosis present

## 2016-06-13 DIAGNOSIS — S1190XA Unspecified open wound of unspecified part of neck, initial encounter: Secondary | ICD-10-CM

## 2016-06-13 DIAGNOSIS — Z419 Encounter for procedure for purposes other than remedying health state, unspecified: Secondary | ICD-10-CM

## 2016-06-13 DIAGNOSIS — T84028A Dislocation of other internal joint prosthesis, initial encounter: Secondary | ICD-10-CM | POA: Diagnosis present

## 2016-06-13 DIAGNOSIS — M5410 Radiculopathy, site unspecified: Secondary | ICD-10-CM | POA: Diagnosis present

## 2016-06-13 DIAGNOSIS — J45909 Unspecified asthma, uncomplicated: Secondary | ICD-10-CM | POA: Diagnosis present

## 2016-06-13 DIAGNOSIS — Z888 Allergy status to other drugs, medicaments and biological substances status: Secondary | ICD-10-CM | POA: Diagnosis not present

## 2016-06-13 DIAGNOSIS — Y838 Other surgical procedures as the cause of abnormal reaction of the patient, or of later complication, without mention of misadventure at the time of the procedure: Secondary | ICD-10-CM | POA: Diagnosis present

## 2016-06-13 DIAGNOSIS — Z87891 Personal history of nicotine dependence: Secondary | ICD-10-CM | POA: Diagnosis not present

## 2016-06-13 DIAGNOSIS — M5412 Radiculopathy, cervical region: Secondary | ICD-10-CM | POA: Diagnosis present

## 2016-06-13 DIAGNOSIS — M960 Pseudarthrosis after fusion or arthrodesis: Secondary | ICD-10-CM | POA: Diagnosis present

## 2016-06-13 DIAGNOSIS — Z8614 Personal history of Methicillin resistant Staphylococcus aureus infection: Secondary | ICD-10-CM

## 2016-06-13 DIAGNOSIS — Z885 Allergy status to narcotic agent status: Secondary | ICD-10-CM | POA: Diagnosis not present

## 2016-06-13 DIAGNOSIS — K219 Gastro-esophageal reflux disease without esophagitis: Secondary | ICD-10-CM | POA: Diagnosis present

## 2016-06-13 DIAGNOSIS — Z87442 Personal history of urinary calculi: Secondary | ICD-10-CM | POA: Diagnosis not present

## 2016-06-13 HISTORY — PX: POSTERIOR CERVICAL FUSION/FORAMINOTOMY: SHX5038

## 2016-06-13 HISTORY — PX: APPLICATION OF A-CELL OF HEAD/NECK: SHX6304

## 2016-06-13 SURGERY — POSTERIOR CERVICAL FUSION/FORAMINOTOMY LEVEL 4
Anesthesia: General | Site: Neck

## 2016-06-13 MED ORDER — FLEET ENEMA 7-19 GM/118ML RE ENEM: 1.0000 | ENEMA | Freq: Once | RECTAL | Status: DC | PRN

## 2016-06-13 MED ORDER — HYDROMORPHONE HCL 1 MG/ML IJ SOLN
0.2500 mg | INTRAMUSCULAR | Status: DC | PRN
  Administered 2016-06-13 (×3): 0.5 mg via INTRAVENOUS

## 2016-06-13 MED ORDER — HEMOSTATIC AGENTS (NO CHARGE) OPTIME: TOPICAL | Status: DC | PRN

## 2016-06-13 MED ORDER — HYDROMORPHONE HCL 1 MG/ML IJ SOLN
INTRAMUSCULAR | Status: DC | PRN
  Administered 2016-06-13 (×2): 0.5 mg via INTRAVENOUS

## 2016-06-13 MED ORDER — SODIUM CHLORIDE 0.9% FLUSH: 3.0000 mL | INTRAVENOUS | Status: DC | PRN

## 2016-06-13 MED ORDER — BUPIVACAINE HCL (PF) 0.25 % IJ SOLN
INTRAMUSCULAR | Status: AC
  Filled 2016-06-13: qty 30

## 2016-06-13 MED ORDER — ONDANSETRON HCL 4 MG/2ML IJ SOLN
INTRAMUSCULAR | Status: DC | PRN
  Administered 2016-06-13 (×2): 4 mg via INTRAVENOUS

## 2016-06-13 MED ORDER — SUCCINYLCHOLINE CHLORIDE 20 MG/ML IJ SOLN
INTRAMUSCULAR | Status: DC | PRN
  Administered 2016-06-13: 120 mg via INTRAVENOUS

## 2016-06-13 MED ORDER — ONDANSETRON HCL 4 MG PO TABS: 4.0000 mg | ORAL_TABLET | Freq: Four times a day (QID) | ORAL | Status: DC | PRN

## 2016-06-13 MED ORDER — ONDANSETRON HCL 4 MG PO TABS: 4.0000 mg | ORAL_TABLET | Freq: Three times a day (TID) | ORAL | Status: DC | PRN

## 2016-06-13 MED ORDER — ETOMIDATE 2 MG/ML IV SOLN
INTRAVENOUS | Status: AC
  Filled 2016-06-13: qty 10

## 2016-06-13 MED ORDER — POTASSIUM CHLORIDE IN NACL 20-0.9 MEQ/L-% IV SOLN
INTRAVENOUS | Status: DC
  Administered 2016-06-13 – 2016-06-14 (×2): via INTRAVENOUS
  Filled 2016-06-13: qty 1000

## 2016-06-13 MED ORDER — HYDROMORPHONE HCL ER 8 MG PO T24A
16.0000 mg | EXTENDED_RELEASE_TABLET | Freq: Two times a day (BID) | ORAL | Status: DC
  Administered 2016-06-13 – 2016-06-15 (×4): 16 mg via ORAL
  Filled 2016-06-13 (×4): qty 2

## 2016-06-13 MED ORDER — SUCCINYLCHOLINE CHLORIDE 200 MG/10ML IV SOSY
PREFILLED_SYRINGE | INTRAVENOUS | Status: AC
  Filled 2016-06-13: qty 10

## 2016-06-13 MED ORDER — SIMETHICONE 80 MG PO CHEW: 160.0000 mg | CHEWABLE_TABLET | Freq: Four times a day (QID) | ORAL | Status: DC | PRN

## 2016-06-13 MED ORDER — FLUOXETINE HCL 20 MG PO TABS: 100.0000 mg | ORAL_TABLET | Freq: Every day | ORAL | Status: DC

## 2016-06-13 MED ORDER — CHLORHEXIDINE GLUCONATE CLOTH 2 % EX PADS: 6.0000 | MEDICATED_PAD | Freq: Once | CUTANEOUS | Status: DC

## 2016-06-13 MED ORDER — ONDANSETRON HCL 4 MG/2ML IJ SOLN
INTRAMUSCULAR | Status: AC
  Filled 2016-06-13: qty 2

## 2016-06-13 MED ORDER — MENTHOL 3 MG MT LOZG: 1.0000 | LOZENGE | OROMUCOSAL | Status: DC | PRN

## 2016-06-13 MED ORDER — HYDROMORPHONE HCL 1 MG/ML IJ SOLN
INTRAMUSCULAR | Status: AC
  Filled 2016-06-13: qty 1

## 2016-06-13 MED ORDER — MEPERIDINE HCL 25 MG/ML IJ SOLN: 6.2500 mg | INTRAMUSCULAR | Status: DC | PRN

## 2016-06-13 MED ORDER — SUGAMMADEX SODIUM 200 MG/2ML IV SOLN
INTRAVENOUS | Status: AC
  Filled 2016-06-13: qty 2

## 2016-06-13 MED ORDER — ENSURE ENLIVE PO LIQD
Freq: Four times a day (QID) | ORAL | Status: DC
  Administered 2016-06-13 – 2016-06-14 (×2): 237 mL via ORAL
  Administered 2016-06-15: 1 via ORAL
  Filled 2016-06-13 (×11): qty 237

## 2016-06-13 MED ORDER — PHENYLEPHRINE 40 MCG/ML (10ML) SYRINGE FOR IV PUSH (FOR BLOOD PRESSURE SUPPORT)
PREFILLED_SYRINGE | INTRAVENOUS | Status: AC
Start: 2016-06-13 — End: 2016-06-13
  Filled 2016-06-13: qty 10

## 2016-06-13 MED ORDER — PHENOL 1.4 % MT LIQD: 1.0000 | OROMUCOSAL | Status: DC | PRN

## 2016-06-13 MED ORDER — DEXAMETHASONE SODIUM PHOSPHATE 10 MG/ML IJ SOLN
INTRAMUSCULAR | Status: DC | PRN
  Administered 2016-06-13: 10 mg via INTRAVENOUS

## 2016-06-13 MED ORDER — HYDROMORPHONE HCL 1 MG/ML IJ SOLN
0.5000 mg | INTRAMUSCULAR | Status: DC | PRN
  Administered 2016-06-13 – 2016-06-15 (×9): 1 mg via INTRAVENOUS
  Filled 2016-06-13 (×9): qty 1

## 2016-06-13 MED ORDER — ROCURONIUM BROMIDE 100 MG/10ML IV SOLN
INTRAVENOUS | Status: DC | PRN
  Administered 2016-06-13: 50 mg via INTRAVENOUS
  Administered 2016-06-13 (×6): 10 mg via INTRAVENOUS

## 2016-06-13 MED ORDER — FENTANYL CITRATE (PF) 250 MCG/5ML IJ SOLN
INTRAMUSCULAR | Status: AC
  Filled 2016-06-13: qty 5

## 2016-06-13 MED ORDER — METHOCARBAMOL 500 MG PO TABS
500.0000 mg | ORAL_TABLET | Freq: Four times a day (QID) | ORAL | Status: DC | PRN
  Administered 2016-06-13 – 2016-06-15 (×4): 500 mg via ORAL
  Filled 2016-06-13 (×4): qty 1

## 2016-06-13 MED ORDER — SODIUM CHLORIDE 0.9% FLUSH
3.0000 mL | Freq: Two times a day (BID) | INTRAVENOUS | Status: DC
  Administered 2016-06-13 – 2016-06-14 (×2): 3 mL via INTRAVENOUS

## 2016-06-13 MED ORDER — ADULT MULTIVITAMIN W/MINERALS CH
1.0000 | ORAL_TABLET | Freq: Every day | ORAL | Status: DC
  Administered 2016-06-13 – 2016-06-15 (×3): 1 via ORAL
  Filled 2016-06-13 (×3): qty 1

## 2016-06-13 MED ORDER — PHENYLEPHRINE HCL 10 MG/ML IJ SOLN
INTRAVENOUS | Status: DC | PRN
  Administered 2016-06-13: 10 ug/min via INTRAVENOUS

## 2016-06-13 MED ORDER — HYDROMORPHONE HCL 1 MG/ML IJ SOLN
INTRAMUSCULAR | Status: AC
  Filled 2016-06-13: qty 0.5

## 2016-06-13 MED ORDER — LIDOCAINE HCL (CARDIAC) 20 MG/ML IV SOLN
INTRAVENOUS | Status: DC | PRN
  Administered 2016-06-13 (×2): 100 mg via INTRAVENOUS

## 2016-06-13 MED ORDER — DIPHENHYDRAMINE HCL 25 MG PO CAPS: 25.0000 mg | ORAL_CAPSULE | Freq: Four times a day (QID) | ORAL | Status: DC | PRN

## 2016-06-13 MED ORDER — ZINC SULFATE 220 (50 ZN) MG PO CAPS
220.0000 mg | ORAL_CAPSULE | Freq: Every day | ORAL | Status: DC
  Administered 2016-06-14 – 2016-06-15 (×2): 220 mg via ORAL
  Filled 2016-06-13 (×2): qty 1

## 2016-06-13 MED ORDER — DIAZEPAM 5 MG PO TABS
ORAL_TABLET | ORAL | Status: AC
  Filled 2016-06-13: qty 1

## 2016-06-13 MED ORDER — OXYCODONE HCL 5 MG PO TABS
20.0000 mg | ORAL_TABLET | ORAL | Status: DC | PRN
  Administered 2016-06-13 – 2016-06-14 (×4): 20 mg via ORAL
  Filled 2016-06-13 (×4): qty 4

## 2016-06-13 MED ORDER — ONDANSETRON HCL 4 MG/2ML IJ SOLN
4.0000 mg | Freq: Four times a day (QID) | INTRAMUSCULAR | Status: DC | PRN
  Administered 2016-06-15 (×2): 4 mg via INTRAVENOUS
  Filled 2016-06-13 (×2): qty 2

## 2016-06-13 MED ORDER — ROCURONIUM BROMIDE 50 MG/5ML IV SOSY
PREFILLED_SYRINGE | INTRAVENOUS | Status: AC
  Filled 2016-06-13: qty 5

## 2016-06-13 MED ORDER — BISACODYL 5 MG PO TBEC: 5.0000 mg | DELAYED_RELEASE_TABLET | Freq: Every day | ORAL | Status: DC | PRN

## 2016-06-13 MED ORDER — BUPIVACAINE LIPOSOME 1.3 % IJ SUSP: 20.0000 mL | INTRAMUSCULAR | Status: DC

## 2016-06-13 MED ORDER — SERTRALINE HCL 100 MG PO TABS
100.0000 mg | ORAL_TABLET | Freq: Every day | ORAL | Status: DC
  Administered 2016-06-13 – 2016-06-14 (×2): 100 mg via ORAL
  Filled 2016-06-13 (×2): qty 1

## 2016-06-13 MED ORDER — LACTATED RINGERS IV SOLN: INTRAVENOUS | Status: DC | PRN

## 2016-06-13 MED ORDER — BUPIVACAINE-EPINEPHRINE 0.25% -1:200000 IJ SOLN
INTRAMUSCULAR | Status: DC | PRN
  Administered 2016-06-13: 20 mL
  Administered 2016-06-13: 4 mL

## 2016-06-13 MED ORDER — SUGAMMADEX SODIUM 200 MG/2ML IV SOLN
INTRAVENOUS | Status: DC | PRN
  Administered 2016-06-13: 200 mg via INTRAVENOUS

## 2016-06-13 MED ORDER — LIDOCAINE 2% (20 MG/ML) 5 ML SYRINGE
INTRAMUSCULAR | Status: AC
  Filled 2016-06-13: qty 5

## 2016-06-13 MED ORDER — ACETAMINOPHEN 10 MG/ML IV SOLN: 1000.0000 mg | Freq: Once | INTRAVENOUS | Status: DC | PRN

## 2016-06-13 MED ORDER — FENTANYL CITRATE (PF) 100 MCG/2ML IJ SOLN
INTRAMUSCULAR | Status: DC | PRN
  Administered 2016-06-13: 50 ug via INTRAVENOUS
  Administered 2016-06-13: 100 ug via INTRAVENOUS
  Administered 2016-06-13 (×2): 50 ug via INTRAVENOUS
  Administered 2016-06-13: 150 ug via INTRAVENOUS
  Administered 2016-06-13 (×2): 50 ug via INTRAVENOUS

## 2016-06-13 MED ORDER — THROMBIN 20000 UNITS EX SOLR
CUTANEOUS | Status: DC | PRN
  Administered 2016-06-13: 20 mL via TOPICAL

## 2016-06-13 MED ORDER — SUMATRIPTAN SUCCINATE 50 MG PO TABS
50.0000 mg | ORAL_TABLET | ORAL | Status: DC | PRN
  Administered 2016-06-14 – 2016-06-15 (×4): 50 mg via ORAL
  Filled 2016-06-13 (×6): qty 1

## 2016-06-13 MED ORDER — EPINEPHRINE PF 1 MG/ML IJ SOLN
INTRAMUSCULAR | Status: AC
  Filled 2016-06-13: qty 1

## 2016-06-13 MED ORDER — BACITRACIN ZINC 500 UNIT/GM EX OINT
TOPICAL_OINTMENT | CUTANEOUS | Status: DC | PRN
  Administered 2016-06-13: 1 via TOPICAL

## 2016-06-13 MED ORDER — IMIPRAMINE HCL 10 MG PO TABS
10.0000 mg | ORAL_TABLET | Freq: Every day | ORAL | Status: DC
  Administered 2016-06-13 – 2016-06-14 (×2): 10 mg via ORAL
  Filled 2016-06-13 (×2): qty 1

## 2016-06-13 MED ORDER — VITAMIN C 500 MG PO TABS
500.0000 mg | ORAL_TABLET | Freq: Two times a day (BID) | ORAL | Status: DC
  Administered 2016-06-14 – 2016-06-15 (×3): 500 mg via ORAL
  Filled 2016-06-13 (×3): qty 1

## 2016-06-13 MED ORDER — SODIUM CHLORIDE 0.9 % IV SOLN: 250.0000 mL | INTRAVENOUS | Status: DC

## 2016-06-13 MED ORDER — ALUM & MAG HYDROXIDE-SIMETH 200-200-20 MG/5ML PO SUSP: 30.0000 mL | Freq: Four times a day (QID) | ORAL | Status: DC | PRN

## 2016-06-13 MED ORDER — EPHEDRINE 5 MG/ML INJ
INTRAVENOUS | Status: AC
  Filled 2016-06-13: qty 10

## 2016-06-13 MED ORDER — LIDOCAINE HCL (PF) 1 % IJ SOLN
INTRAMUSCULAR | Status: AC
  Filled 2016-06-13: qty 30

## 2016-06-13 MED ORDER — DIAZEPAM 5 MG PO TABS
5.0000 mg | ORAL_TABLET | Freq: Four times a day (QID) | ORAL | Status: DC | PRN
  Administered 2016-06-13 – 2016-06-15 (×6): 5 mg via ORAL
  Filled 2016-06-13 (×5): qty 1

## 2016-06-13 MED ORDER — PANTOPRAZOLE SODIUM 40 MG PO TBEC: 40.0000 mg | DELAYED_RELEASE_TABLET | Freq: Once | ORAL | Status: DC

## 2016-06-13 MED ORDER — EPHEDRINE SULFATE 50 MG/ML IJ SOLN
INTRAMUSCULAR | Status: DC | PRN
  Administered 2016-06-13: 5 mg via INTRAVENOUS
  Administered 2016-06-13: 10 mg via INTRAVENOUS
  Administered 2016-06-13: 5 mg via INTRAVENOUS

## 2016-06-13 MED ORDER — DOCUSATE SODIUM 100 MG PO CAPS
100.0000 mg | ORAL_CAPSULE | Freq: Two times a day (BID) | ORAL | Status: DC
  Administered 2016-06-13 – 2016-06-15 (×4): 100 mg via ORAL
  Filled 2016-06-13 (×4): qty 1

## 2016-06-13 MED ORDER — BUPIVACAINE LIPOSOME 1.3 % IJ SUSP
20.0000 mL | INTRAMUSCULAR | Status: AC
  Administered 2016-06-13: 20 mL
  Filled 2016-06-13: qty 20

## 2016-06-13 MED ORDER — SENNOSIDES-DOCUSATE SODIUM 8.6-50 MG PO TABS: 1.0000 | ORAL_TABLET | Freq: Every evening | ORAL | Status: DC | PRN

## 2016-06-13 MED ORDER — POVIDONE-IODINE 7.5 % EX SOLN
Freq: Once | CUTANEOUS | Status: DC
  Filled 2016-06-13: qty 118

## 2016-06-13 MED ORDER — GLYCOPYRROLATE 0.2 MG/ML IJ SOLN
INTRAMUSCULAR | Status: DC | PRN
  Administered 2016-06-13: 0.4 mg via INTRAVENOUS

## 2016-06-13 MED ORDER — LACTATED RINGERS IV SOLN
INTRAVENOUS | Status: DC
  Administered 2016-06-13: 50 mL/h via INTRAVENOUS
  Administered 2016-06-13: 18:00:00 via INTRAVENOUS

## 2016-06-13 MED ORDER — MIDAZOLAM HCL 2 MG/2ML IJ SOLN
INTRAMUSCULAR | Status: AC
  Filled 2016-06-13: qty 2

## 2016-06-13 MED ORDER — BACITRACIN ZINC 500 UNIT/GM EX OINT
TOPICAL_OINTMENT | CUTANEOUS | Status: AC
  Filled 2016-06-13: qty 28.35

## 2016-06-13 MED ORDER — VANCOMYCIN HCL IN DEXTROSE 1-5 GM/200ML-% IV SOLN
1000.0000 mg | INTRAVENOUS | Status: AC
  Administered 2016-06-13: 1000 mg via INTRAVENOUS
  Filled 2016-06-13: qty 200

## 2016-06-13 MED ORDER — GABAPENTIN (ONCE-DAILY) 600 MG PO TABS: 3000.0000 mg | ORAL_TABLET | Freq: Every day | ORAL | Status: DC

## 2016-06-13 MED ORDER — LACTATED RINGERS IV SOLN
INTRAVENOUS | Status: DC | PRN
  Administered 2016-06-13 (×3): via INTRAVENOUS

## 2016-06-13 MED ORDER — CEFAZOLIN SODIUM-DEXTROSE 2-4 GM/100ML-% IV SOLN: 2.0000 g | INTRAVENOUS | Status: DC

## 2016-06-13 MED ORDER — 0.9 % SODIUM CHLORIDE (POUR BTL) OPTIME
TOPICAL | Status: DC | PRN
  Administered 2016-06-13 (×4): 1000 mL

## 2016-06-13 MED ORDER — ALBUTEROL SULFATE (2.5 MG/3ML) 0.083% IN NEBU
2.5000 mg | INHALATION_SOLUTION | Freq: Four times a day (QID) | RESPIRATORY_TRACT | Status: DC | PRN
Start: 2016-06-13 — End: 2016-06-15

## 2016-06-13 MED ORDER — PROMETHAZINE HCL 25 MG/ML IJ SOLN: 6.2500 mg | INTRAMUSCULAR | Status: DC | PRN

## 2016-06-13 MED ORDER — ARTIFICIAL TEARS OP OINT
TOPICAL_OINTMENT | OPHTHALMIC | Status: DC | PRN
  Administered 2016-06-13: 1 via OPHTHALMIC

## 2016-06-13 MED ORDER — CEFAZOLIN SODIUM-DEXTROSE 2-4 GM/100ML-% IV SOLN
2.0000 g | Freq: Three times a day (TID) | INTRAVENOUS | Status: AC
  Administered 2016-06-13 – 2016-06-14 (×2): 2 g via INTRAVENOUS
  Filled 2016-06-13 (×2): qty 100

## 2016-06-13 MED ORDER — PROPOFOL 10 MG/ML IV BOLUS
INTRAVENOUS | Status: DC | PRN
  Administered 2016-06-13: 160 mg via INTRAVENOUS

## 2016-06-13 MED ORDER — MIDAZOLAM HCL 5 MG/5ML IJ SOLN
INTRAMUSCULAR | Status: DC | PRN
Start: 2016-06-13 — End: 2016-06-13
  Administered 2016-06-13: 2 mg via INTRAVENOUS

## 2016-06-13 MED ORDER — DEXAMETHASONE SODIUM PHOSPHATE 10 MG/ML IJ SOLN
INTRAMUSCULAR | Status: AC
Start: 2016-06-13 — End: 2016-06-13
  Filled 2016-06-13: qty 1

## 2016-06-13 MED ORDER — PANTOPRAZOLE SODIUM 20 MG PO TBEC
20.0000 mg | DELAYED_RELEASE_TABLET | ORAL | Status: DC
  Administered 2016-06-14 – 2016-06-15 (×2): 20 mg via ORAL
  Filled 2016-06-13 (×2): qty 1

## 2016-06-13 MED ORDER — DRONABINOL 5 MG PO CAPS
5.0000 mg | ORAL_CAPSULE | Freq: Three times a day (TID) | ORAL | Status: DC
  Administered 2016-06-14 – 2016-06-15 (×4): 5 mg via ORAL
  Filled 2016-06-13 (×4): qty 2

## 2016-06-13 MED ORDER — PHENYLEPHRINE HCL 10 MG/ML IJ SOLN
INTRAMUSCULAR | Status: DC | PRN
  Administered 2016-06-13 (×2): 40 ug via INTRAVENOUS

## 2016-06-13 MED ORDER — ALBUMIN HUMAN 5 % IV SOLN
INTRAVENOUS | Status: DC | PRN
  Administered 2016-06-13: 14:00:00 via INTRAVENOUS

## 2016-06-13 MED ORDER — THROMBIN 20000 UNITS EX SOLR
CUTANEOUS | Status: AC
  Filled 2016-06-13: qty 20000

## 2016-06-13 MED ORDER — CEFAZOLIN SODIUM-DEXTROSE 2-4 GM/100ML-% IV SOLN
2.0000 g | INTRAVENOUS | Status: AC
  Administered 2016-06-13: 2 g via INTRAVENOUS
  Filled 2016-06-13: qty 100

## 2016-06-13 SURGICAL SUPPLY — 107 items
BENZOIN TINCTURE PRP APPL 2/3 (GAUZE/BANDAGES/DRESSINGS) ×3 IMPLANT
BIT DRILL MOUNTAINEER FIX 14 (BIT) ×2
BIT DRILL MOUNTAINEER FIX 14MM (BIT) ×2 IMPLANT
BIT DRILL MOUNTAINER FXED 12MM (DRILL) ×2 IMPLANT
BLADE 10 SAFETY STRL DISP (BLADE) IMPLANT
BLADE CLIPPER SURG NEURO (BLADE) IMPLANT
BNDG CONFORM 2 STRL LF (GAUZE/BANDAGES/DRESSINGS) IMPLANT
BNDG GAUZE ELAST 4 BULKY (GAUZE/BANDAGES/DRESSINGS) IMPLANT
BONE VIVIGEN FORMABLE 1.3CC (Bone Implant) ×3 IMPLANT
BUR NEURO DRILL SOFT 3.0X3.8M (BURR) ×3 IMPLANT
BUR PRESCISION 1.7 ELITE (BURR) ×3 IMPLANT
CANISTER SUCT 3000ML PPV (MISCELLANEOUS) ×3 IMPLANT
CLEANER TIP ELECTROSURG 2X2 (MISCELLANEOUS) IMPLANT
CONT SPEC 4OZ CLIKSEAL STRL BL (MISCELLANEOUS) IMPLANT
CONT SPEC STER OR (MISCELLANEOUS) IMPLANT
CORDS BIPOLAR (ELECTRODE) ×3 IMPLANT
COVER BACK TABLE 80X110 HD (DRAPES) IMPLANT
COVER SURGICAL LIGHT HANDLE (MISCELLANEOUS) ×3 IMPLANT
DRAIN CHANNEL 10F 3/8 F FF (DRAIN) IMPLANT
DRAIN CHANNEL 15F RND FF W/TCR (WOUND CARE) IMPLANT
DRAIN HEMOVAC 1/8 X 5 (WOUND CARE) IMPLANT
DRAPE C-ARM 42X72 X-RAY (DRAPES) IMPLANT
DRAPE INCISE IOBAN 66X45 STRL (DRAPES) ×3 IMPLANT
DRAPE PED LAPAROTOMY (DRAPES) ×3 IMPLANT
DRAPE POUCH INSTRU U-SHP 10X18 (DRAPES) IMPLANT
DRAPE PROXIMA HALF (DRAPES) IMPLANT
DRAPE SURG 17X23 STRL (DRAPES) ×15 IMPLANT
DRAPE UNIVERSAL PACK (DRAPES) ×3 IMPLANT
DRESSING HYDROCOLLOID 4X4 XTH (GAUZE/BANDAGES/DRESSINGS) ×3 IMPLANT
DRILL BIT MOUNTAINEER FIX 14MM (BIT) ×1
DRILL MOUNTAINEER FIXED 12MM (DRILL) ×3
DRSG EMULSION OIL 3X3 NADH (GAUZE/BANDAGES/DRESSINGS) IMPLANT
DRSG MEPILEX BORDER 4X8 (GAUZE/BANDAGES/DRESSINGS) IMPLANT
DURAPREP 26ML APPLICATOR (WOUND CARE) ×3 IMPLANT
ELECT BLADE 4.0 EZ CLEAN MEGAD (MISCELLANEOUS) ×3
ELECT CAUTERY BLADE 6.4 (BLADE) ×3 IMPLANT
ELECT COATED BLADE 2.86 ST (ELECTRODE) ×3 IMPLANT
ELECT NEEDLE TIP 2.8 STRL (NEEDLE) ×3 IMPLANT
ELECT REM PT RETURN 9FT ADLT (ELECTROSURGICAL) ×3
ELECTRODE BLDE 4.0 EZ CLN MEGD (MISCELLANEOUS) ×2 IMPLANT
ELECTRODE REM PT RTRN 9FT ADLT (ELECTROSURGICAL) ×2 IMPLANT
EVACUATOR SILICONE 100CC (DRAIN) IMPLANT
GAUZE SPONGE 4X4 12PLY STRL (GAUZE/BANDAGES/DRESSINGS) IMPLANT
GAUZE SPONGE 4X4 16PLY XRAY LF (GAUZE/BANDAGES/DRESSINGS) ×3 IMPLANT
GLOVE BIO SURGEON STRL SZ 6.5 (GLOVE) ×6 IMPLANT
GLOVE BIO SURGEON STRL SZ7 (GLOVE) ×3 IMPLANT
GLOVE BIO SURGEON STRL SZ8 (GLOVE) ×3 IMPLANT
GLOVE BIOGEL PI IND STRL 7.5 (GLOVE) ×2 IMPLANT
GLOVE BIOGEL PI IND STRL 8 (GLOVE) ×2 IMPLANT
GLOVE BIOGEL PI INDICATOR 7.5 (GLOVE) ×1
GLOVE BIOGEL PI INDICATOR 8 (GLOVE) ×1
GLOVE ECLIPSE 6.5 STRL STRAW (GLOVE) ×6 IMPLANT
GOWN STRL REUS W/ TWL LRG LVL3 (GOWN DISPOSABLE) ×16 IMPLANT
GOWN STRL REUS W/ TWL XL LVL3 (GOWN DISPOSABLE) ×4 IMPLANT
GOWN STRL REUS W/TWL LRG LVL3 (GOWN DISPOSABLE) ×8
GOWN STRL REUS W/TWL XL LVL3 (GOWN DISPOSABLE) ×2
IV CATH 14GX2 1/4 (CATHETERS) ×3 IMPLANT
KIT BASIN OR (CUSTOM PROCEDURE TRAY) ×3 IMPLANT
KIT ROOM TURNOVER OR (KITS) ×3 IMPLANT
NEEDLE 18GX1X1/2 (RX/OR ONLY) (NEEDLE) ×3 IMPLANT
NEEDLE HYPO 25GX1X1/2 BEV (NEEDLE) ×3 IMPLANT
NEEDLE PRECISIONGLIDE 27X1.5 (NEEDLE) IMPLANT
NS IRRIG 1000ML POUR BTL (IV SOLUTION) ×3 IMPLANT
PACK LAMINECTOMY ORTHO (CUSTOM PROCEDURE TRAY) ×3 IMPLANT
PAD ARMBOARD 7.5X6 YLW CONV (MISCELLANEOUS) ×15 IMPLANT
PATTIES SURGICAL .5 X.5 (GAUZE/BANDAGES/DRESSINGS) ×3 IMPLANT
PATTIES SURGICAL .5 X3 (DISPOSABLE) IMPLANT
PATTIES SURGICAL .5X1.5 (GAUZE/BANDAGES/DRESSINGS) IMPLANT
PENCIL BUTTON HOLSTER BLD 10FT (ELECTRODE) IMPLANT
PIN MAYFIELD SKULL DISP (PIN) ×3 IMPLANT
PUTTY DBX 1CC (Putty) ×3 IMPLANT
PUTTY DBX 1CC DEPUY (Putty) ×2 IMPLANT
ROD MOUTAINEER 3.5X200MM (Rod) ×3 IMPLANT
ROD TEMPLATE MOUNTAINEER 120MM (ROD) ×6 IMPLANT
SCREW F A 3.5X12 (Screw) ×9 IMPLANT
SCREW F A 3.5X14 (Screw) ×12 IMPLANT
SCREW INNER (Screw) ×24 IMPLANT
SCREW MOUNT F/A POL 4.0X14 (Screw) ×6 IMPLANT
SCREW MOUNTAINEER 4.0X20MM (Screw) ×6 IMPLANT
SPONGE INTESTINAL PEANUT (DISPOSABLE) IMPLANT
SPONGE LAP 18X18 X RAY DECT (DISPOSABLE) ×3 IMPLANT
SPONGE SURGIFOAM ABS GEL 100 (HEMOSTASIS) ×3 IMPLANT
STRIP CLOSURE SKIN 1/2X4 (GAUZE/BANDAGES/DRESSINGS) IMPLANT
SURGIFLO W/THROMBIN 8M KIT (HEMOSTASIS) IMPLANT
SUT CHROMIC 4 0 P 3 18 (SUTURE) IMPLANT
SUT ETHILON 4 0 PS 2 18 (SUTURE) IMPLANT
SUT ETHILON 5 0 P 3 18 (SUTURE)
SUT MNCRL AB 3-0 PS2 18 (SUTURE) ×12 IMPLANT
SUT MNCRL AB 4-0 PS2 18 (SUTURE) ×9 IMPLANT
SUT MON AB 5-0 PS2 18 (SUTURE) ×6 IMPLANT
SUT NYLON ETHILON 5-0 P-3 1X18 (SUTURE) IMPLANT
SUT SILK 4 0 (SUTURE) ×1
SUT SILK 4-0 18XBRD TIE 12 (SUTURE) ×2 IMPLANT
SUT VIC AB 0 CT1 18XCR BRD 8 (SUTURE) ×2 IMPLANT
SUT VIC AB 0 CT1 8-18 (SUTURE) ×1
SUT VIC AB 1 CT1 18XCR BRD 8 (SUTURE) ×4 IMPLANT
SUT VIC AB 1 CT1 8-18 (SUTURE) ×2
SUT VIC AB 2-0 CT2 18 VCP726D (SUTURE) ×6 IMPLANT
SYR BULB IRRIGATION 50ML (SYRINGE) ×6 IMPLANT
SYR CONTROL 10ML LL (SYRINGE) ×6 IMPLANT
SYR TB 1ML LUER SLIP (SYRINGE) ×3 IMPLANT
TAPE CLOTH 4X10 WHT NS (GAUZE/BANDAGES/DRESSINGS) ×3 IMPLANT
TOWEL OR 17X24 6PK STRL BLUE (TOWEL DISPOSABLE) ×3 IMPLANT
TOWEL OR 17X26 10 PK STRL BLUE (TOWEL DISPOSABLE) ×3 IMPLANT
TRAY ENT MC OR (CUSTOM PROCEDURE TRAY) IMPLANT
TRAY FOLEY W/METER SILVER 16FR (SET/KITS/TRAYS/PACK) ×3 IMPLANT
YANKAUER SUCT BULB TIP NO VENT (SUCTIONS) ×3 IMPLANT

## 2016-06-13 NOTE — Anesthesia Procedure Notes (Signed)
Procedure Name: Intubation Date/Time: 06/13/2016 10:27 AM Performed by: Virgel GessHOLTZMAN, Kammie Scioli LEFFEW Pre-anesthesia Checklist: Patient identified, Patient being monitored, Timeout performed, Emergency Drugs available and Suction available Patient Re-evaluated:Patient Re-evaluated prior to inductionOxygen Delivery Method: Circle System Utilized Preoxygenation: Pre-oxygenation with 100% oxygen Intubation Type: IV induction Laryngoscope Size: Glidescope and 4 Grade View: Grade I Tube type: Oral Tube size: 7.5 mm Number of attempts: 1 Airway Equipment and Method: Stylet Placement Confirmation: ETT inserted through vocal cords under direct vision,  positive ETCO2 and breath sounds checked- equal and bilateral Secured at: 22 cm Tube secured with: Tape Dental Injury: Teeth and Oropharynx as per pre-operative assessment

## 2016-06-13 NOTE — Transfer of Care (Signed)
Immediate Anesthesia Transfer of Care Note  Patient: Jared Li  Procedure(s) Performed: Procedure(s) with comments: POSTERIOR CERVICAL DECOMPRESSION FUSION, CERVICAL 3-4, CERVICAL 4-5, CERVICAL 5-6, CERVICAL 6-7 WITH INSTRUMENTATION AND ALLOGRAFT (N/A) - POSTERIOR CERVICAL DECOMPRESSION FUSION, CERVICAL 3-4, CERVICAL 4-5, CERVICAL 5-6, CERVICAL 6-7 WITH INSTRUMENTATION AND ALLOGRAFT neck closure AFTER SPINE SURGERY WITH placement of wound vac. (N/A)  Patient Location: PACU  Anesthesia Type:General  Level of Consciousness: awake, oriented, sedated, patient cooperative and responds to stimulation  Airway & Oxygen Therapy: Patient Spontanous Breathing and Patient connected to nasal cannula oxygen  Post-op Assessment: Report given to RN and Post -op Vital signs reviewed and stable  Post vital signs: Reviewed and stable  Last Vitals:  Vitals:   06/13/16 0855  BP: 121/89  Pulse: 86  Resp: 20  Temp: 37.1 C    Last Pain:  Vitals:   06/13/16 0855  TempSrc: Oral  PainSc: 8       Patients Stated Pain Goal: 2 (06/13/16 0855)  Complications: No apparent anesthesia complications

## 2016-06-13 NOTE — Anesthesia Preprocedure Evaluation (Signed)
Anesthesia Evaluation  Patient identified by MRN, date of birth, ID band Patient awake    Reviewed: Allergy & Precautions, NPO status , Patient's Chart, lab work & pertinent test results  Airway Mallampati: I       Dental no notable dental hx.    Pulmonary former smoker,    Pulmonary exam normal        Cardiovascular negative cardio ROS Normal cardiovascular exam     Neuro/Psych    GI/Hepatic Neg liver ROS, GERD  Medicated and Controlled,  Endo/Other  negative endocrine ROS  Renal/GU negative Renal ROS  negative genitourinary   Musculoskeletal   Abdominal Normal abdominal exam  (+)   Peds  Hematology negative hematology ROS (+)   Anesthesia Other Findings   Reproductive/Obstetrics                             Anesthesia Physical Anesthesia Plan  ASA: II  Anesthesia Plan: General   Post-op Pain Management:    Induction: Intravenous  Airway Management Planned: Oral ETT  Additional Equipment:   Intra-op Plan:   Post-operative Plan: Extubation in OR  Informed Consent: I have reviewed the patients History and Physical, chart, labs and discussed the procedure including the risks, benefits and alternatives for the proposed anesthesia with the patient or authorized representative who has indicated his/her understanding and acceptance.     Plan Discussed with: CRNA and Surgeon  Anesthesia Plan Comments:         Anesthesia Quick Evaluation

## 2016-06-13 NOTE — Op Note (Signed)
DATE OF OPERATION: 06/13/2016  LOCATION: [facility]  PREOPERATIVE DIAGNOSIS: Open incision cervical spine  POSTOPERATIVE DIAGNOSIS: Same  PROCEDURE:  1. Trapezius Muscle advancement of cervical spine for closure of 12 cm incision 2. Placement of incisional VAC  SURGEON: Jacarri Gesner Sanger Amr Sturtevant, DO  ASSISTANT: Shawn Rayburn, PA  EBL: 10 cc  CONDITION: Stable  COMPLICATIONS: None  INDICATION: The patient, Jared Li, is a 51 y.o. male born on 11/17/1965, is here for treatment of an open incision of the cervical spine after fixation and placement of hardware.  The patient had several surgeries in the past with complications from infection and wound formation.   PROCEDURE DETAILS:  The patient was seen prior to surgery and marked.  The IV antibiotics were given. The patient was taken to the operating room and given a general anesthetic. A standard time out was performed and all information was confirmed by those in the room. SCDs were placed.   Orthopaedic surgery performed their portion of the case and the patient was surrendered to the plastic surgery service.  The area was examined for bleeding.  Hemostasis was achieved with electrocautery.  The trapezius muscle was identified.  The soft tissue over the muscle was released for 5 cm to improve coverage.  The 3-0 Monocryl was used to advance the muscle over the 12 cm exposed spine and hardware with the fascia.  The soft tissue was then closed with a running 4-0 Monocryl.  The subcutaneous layer was closed with 4-0 Monocryl.  The skin was closed with 5-0 Monocryl.  An incisional VAC was applied.  The patient was allowed to wake up and taken to recovery room in stable condition at the end of the case. The family was notified at the end of the case.

## 2016-06-13 NOTE — H&P (Signed)
Jared Li is an 51 y.o. male.   Chief Complaint: neck surgery HPI: The patient is a 51 yrs old wm here for treatment of neck / spine injury by ortho.  He underwent similar surgery in the past and has seperation of the spine muscles.  There is uncertainty of whether the incision can be closed.  We had a long discussion of possible types of closure including skin graft, Acell or muscle flap and VAC.  Past Medical History:  Diagnosis Date  . Adjustment disorder   . Arthritis   . Asthma   . Cauda equina syndrome ([place])   . Cellulitis of lower limb   . Cervical radiculopathy   . Chronic intractable headache    Mirgraines- lst one 06/02/16  . Chronic pain   . Edema   . GERD (gastroesophageal reflux disease)   . History of kidney stones    passed  . Insomnia   . Loose stools   . Lumbar post-laminectomy syndrome   . MRSA bacteremia   . Open wound of lumbar region with complication   . Physical deconditioning   . Tobacco use disorder     Past Surgical History:  Procedure Laterality Date  . CARPAL TUNNEL RELEASE Bilateral   . CERVICAL FUSION     Numerous  . LUMBAR FUSION     Numerous  . Pain Pump insertion     x 2  . POSTERIOR LAMINECTOMY / DECOMPRESSION LUMBAR SPINE    . removal of pain pump     x 2  . Spinal Card Stimulator Removal      x 2  . SPINAL CORD STIMULATOR INSERTION      x2    No family history on file. Social History:  reports that he quit smoking 11 days ago. His smoking use included Cigarettes. He quit after 35.00 years of use. He has quit using smokeless tobacco. He reports that he does not drink alcohol or use drugs.  Allergies:  Allergies  Allergen Reactions  . Adhesive [Tape] Other (See Comments)    Skin blisters  . Codeine Nausea And Vomiting  . Morphine And Related     Itching     Medications Prior to Admission  Medication Sig Dispense Refill  . albuterol (PROVENTIL HFA;VENTOLIN HFA) 108 (90 Base) MCG/ACT inhaler Inhale 2 puffs into the  lungs every 6 (six) hours as needed for wheezing or shortness of breath.    . diphenhydrAMINE (BENADRYL) 25 MG tablet Take 25 mg by mouth every 6 (six) hours as needed for itching.     . dronabinol (MARINOL) 5 MG capsule Take one capsule by mouth twice daily before meals (Patient taking differently: Take 5 mg by mouth 3 (three) times daily before meals. ) 60 capsule 0  . FLUoxetine (PROZAC) 10 MG tablet Take 100 mg by mouth at bedtime.     . GRALISE 600 MG TABS Take 3,000 mg by mouth daily with supper. Takes 5 tablets at dinner  5  . HYDROmorphone HCl 16 MG T24A Take 16 mg by mouth 2 (two) times daily.  0  . imipramine (TOFRANIL) 10 MG tablet Take 10 mg by mouth at bedtime.    . Multiple Vitamin (MULTIVITAMIN WITH MINERALS) TABS tablet Take 1 tablet by mouth daily.    . Nutritional Supplements (ENSURE HIGH PROTEIN PO) Take 1 Can by mouth 4 (four) times daily.    Marland Kitchen omeprazole (PRILOSEC OTC) 20 MG tablet Take 20 mg by mouth daily as needed.    Marland Kitchen  ondansetron (ZOFRAN) 4 MG tablet Take 4 mg by mouth every 8 (eight) hours as needed for nausea or vomiting.    . Oxycodone HCl 10 MG TABS Take two tablets by mouth every 4 hours as needed for pain (Patient taking differently: 15 mg every 6 (six) hours as needed. Take two tablets by mouth every 4 hours as needed for pain) 360 tablet 0  . oxyCODONE-acetaminophen (PERCOCET) 10-325 MG tablet Take 1 tablet by mouth every 6 (six) hours as needed for pain. for pain  0  . rizatriptan (MAXALT) 10 MG tablet Take 10 mg by mouth every 2 (two) hours as needed for migraine. May repeat in 2 hours if needed     . sertraline (ZOLOFT) 100 MG tablet Take 100 mg by mouth at bedtime. Takes 2 tablets    . Simethicone (GAS-X PO) Take 2 tablets by mouth 2 (two) times daily as needed (gas).    . vitamin C (ASCORBIC ACID) 500 MG tablet Take 500 mg by mouth 2 (two) times daily.    Marland Kitchen zinc sulfate 220 (50 Zn) MG capsule Take 220 mg by mouth daily.    . [DISCONTINUED] acetaminophen  (TYLENOL) 500 MG tablet Take 1,000 mg by mouth every 6 (six) hours as needed for mild pain.     . [DISCONTINUED] tiZANidine (ZANAFLEX) 4 MG tablet Take 4 mg by mouth every 8 (eight) hours as needed for muscle spasms.      No results found for this or any previous visit (from the past 48 hour(s)). No results found.  Review of Systems  Constitutional: Negative.   HENT: Negative.   Eyes: Negative.   Respiratory: Negative.   Cardiovascular: Negative.   Gastrointestinal: Negative.   Genitourinary: Negative.   Musculoskeletal: Negative.   Skin: Negative.   Neurological: Negative.   Psychiatric/Behavioral: Negative.     Blood pressure 121/89, pulse 86, temperature 98.8 F (37.1 C), temperature source Oral, resp. rate 20, weight 90.7 kg (200 lb), SpO2 92 %. Physical Exam  Constitutional: He is oriented to person, place, and time. He appears well-developed and well-nourished.  HENT:  Head: Normocephalic and atraumatic.  Eyes: EOM are normal. Pupils are equal, round, and reactive to light.  Cardiovascular: Normal rate.   Respiratory: Effort normal.  GI: Soft.  Neurological: He is alert and oriented to person, place, and time.  Skin: Skin is warm.  Psychiatric: He has a normal mood and affect. His behavior is normal. Judgment and thought content normal.     Assessment/Plan Closure of neck incision with possible flap, graft and Acell / VAC.  Wallace Going, DO 06/13/2016, 2:50 PM

## 2016-06-13 NOTE — Progress Notes (Signed)
Patient admitted to room 5C12 at this time. Alert and in stable condition.

## 2016-06-14 MED ORDER — OXYCODONE HCL 5 MG PO TABS
5.0000 mg | ORAL_TABLET | ORAL | Status: DC | PRN
  Administered 2016-06-14 – 2016-06-15 (×4): 5 mg via ORAL
  Filled 2016-06-14 (×4): qty 1

## 2016-06-14 MED ORDER — GABAPENTIN 600 MG PO TABS
1500.0000 mg | ORAL_TABLET | Freq: Two times a day (BID) | ORAL | Status: DC
  Administered 2016-06-14 – 2016-06-15 (×3): 1500 mg via ORAL
  Filled 2016-06-14 (×3): qty 3

## 2016-06-14 MED ORDER — OXYCODONE HCL 5 MG PO TABS: 5.0000 mg | ORAL_TABLET | ORAL | 0 refills | Status: DC | PRN

## 2016-06-14 MED FILL — Heparin Sodium (Porcine) Inj 1000 Unit/ML: INTRAMUSCULAR | Qty: 30 | Status: AC

## 2016-06-14 MED FILL — Sodium Chloride IV Soln 0.9%: INTRAVENOUS | Qty: 2000 | Status: AC

## 2016-06-14 NOTE — Anesthesia Postprocedure Evaluation (Addendum)
Anesthesia Post Note  Patient: Jared Li  Procedure(s) Performed: Procedure(s) (LRB): POSTERIOR CERVICAL DECOMPRESSION FUSION, CERVICAL 3-4, CERVICAL 4-5, CERVICAL 5-6, CERVICAL 6-7 WITH INSTRUMENTATION AND ALLOGRAFT (N/A) neck closure AFTER SPINE SURGERY WITH placement of wound vac. (N/A)  Patient location during evaluation: PACU Anesthesia Type: General Level of consciousness: sedated Pain management: pain level controlled Vital Signs Assessment: post-procedure vital signs reviewed and stable Respiratory status: spontaneous breathing and respiratory function stable Cardiovascular status: stable Anesthetic complications: no                   Ashwini Jago DANIEL

## 2016-06-14 NOTE — Consult Note (Signed)
[facility] CM Primary Care Navigator  06/14/2016  Damico Partin 1965-11-03 [id number]   Met with patient at the bedside to identify possible discharge needs. Patient reports having persistent neck, shoulders and back pain that had led to this admission/ surgery.  Patient endorses Dr. Allyn Kenner with [facility] MD office in [place] as his primary care provider.   Patient shared using [facility] in [place] to obtain medications without any problem.   Patient's brother (Denzel) is managing his medications at home using "pill box" system weekly.   His brother provides transportation to his doctors' appointments per patient.  Patient lives with his brother who is the primary caregiver at home as stated.   Anticipated discharge plan is home according to patient.  He mentioned that [employer] home health RN comes to their house to assist with his care needs.  Patient voiced understanding to call primary care provider's office when he gets back home for a post discharge follow-up appointment within a week or sooner if needs arise. Patient letter (with PCP's contact number) was provided as his reminder.  Patient denies any further health management needs or concerns at this time.   For additional questions please contact:  Edwena Felty A. Brayden Betters, BSN, RN-BC [facility] PRIMARY CARE Navigator Cell: [phone]

## 2016-06-14 NOTE — Progress Notes (Signed)
    Patient doing well Denies arm pain Moderate neck discomfort   Physical Exam: Vitals:   06/14/16 0111 06/14/16 0513  BP: 129/72 (!) 143/88  Pulse: 94 92  Resp: 20 20  Temp: 98.3 F (36.8 C) 98.5 F (36.9 C)    Dressing in place NVI  POD #1 s/p C2-7 PCF doing well with expected postop neck pain  - encourage ambulation - Oxycondone, dilaudid for pain (per pervious pain MD's recs, and will add on  oxycodone to take at home), Valium for muscle spasms - likely d/c home tomorrow - woundvac per plastics

## 2016-06-14 NOTE — Evaluation (Signed)
Occupational Therapy Evaluation Patient Details Name: Jared Li MRN: [id number] DOB: 1965-03-21 Today's Date: 06/14/2016    History of Present Illness PATIENT UNDERWENT TRAPEZIUS MUSCLE ADVANCEMENTOF CERVALCLOSURE. PNT HAS INCISIONAL VACK. PNT. PMH: ADJUSTMENT D/O, OA,CAUDA EQUINA SYNDROME, cervical radiculopathy, choonic pain,lumbar lami, carpeltunnel B, cervial fusion, laumbnar fusion, pain pump,spinalcord stimlator and has been removed.   Clinical Impression   Pt. Was is in severe pain and was unable to sit eob.  Pt. States he is not allowed to weight bear on b ue or to pull. pnt. Is having difficlty with adjusting position in bed. Pt. Wants to d/c home with brother to assit. This therapist asked nurse to please recommend PT to assist with mobilization of patient.     Follow Up Recommendations  Home health OT    Equipment Recommendations  None recommended by OT    Recommendations for Other Services PT consult     Precautions / Restrictions Precautions Precautions:  (neck brace, per pnt. he is not allowed to weight bear throug) Precaution Comments:  (per pnt. he is not allowed to weight bear through b ue. ) Required Braces or Orthoses: Cervical Brace Restrictions Weight Bearing Restrictions: No      Mobility Bed Mobility                  Transfers                 General transfer comment: unable to sit edob    Balance                                           ADL either performed or assessed with clinical judgement   ADL Overall ADL's : Needs assistance/impaired Eating/Feeding: Independent   Grooming: Wash/dry hands;Wash/dry face;Brushing hair;Minimal assistance   Upper Body Bathing: Minimal assistance;Bed level   Lower Body Bathing: Maximal assistance;Bed level   Upper Body Dressing : Moderate assistance;Bed level   Lower Body Dressing: Total assistance;Bed level               Functional mobility during ADLs:   (pt. unable to sit eob secondary to pain.) General ADL Comments: pt. is requiring extensive care secondary to pain and muscle spasm.      Vision Baseline Vision/History: Wears glasses Wears Glasses: At all times Patient Visual Report: No change from baseline       Perception     Praxis      Pertinent Vitals/Pain Pain Assessment: 0-10 Pain Score: 8  Pain Location:  (neck) Pain Descriptors / Indicators: Burning;Sharp;Spasm Pain Intervention(s): Limited activity within patient's tolerance     Hand Dominance Right   Extremity/Trunk Assessment Upper Extremity Assessment Upper Extremity Assessment: Generalized weakness;RUE deficits/detail;LUE deficits/detail RUE Deficits / Details:  (able to lift r shld 10 degrees flex.distal is wlf) LUE Deficits / Details:  (pnt. able to slift shld 10 degrees flex and distal is wfl.)           Communication Communication Communication: No difficulties   Cognition   Behavior During Therapy: WFL for tasks assessed/performed Overall Cognitive Status: Within Functional Limits for tasks assessed                                     General Comments       Exercises  Shoulder Instructions      Home Living Family/patient expects to be discharged to:: Private residence Living Arrangements: Other relatives Available Help at Discharge: Available 24 hours/day Type of Home: House Home Access: Ramped entrance     Home Layout: One level     Bathroom Shower/Tub: Chief Strategy Officer: Handicapped height Bathroom Accessibility: Yes How Accessible: Accessible via walker Home Equipment: Walker - 2 wheels;Bedside commode;Shower seat;Grab bars - tub/shower;Hand held shower head;Wheelchair - manual          Prior Functioning/Environment Level of Independence: Independent with assistive device(s)                 OT Problem List: Decreased range of motion;Decreased activity tolerance;Decreased knowledge  of use of DME or AE;Pain      OT Treatment/Interventions: Self-care/ADL training;DME and/or AE instruction;Therapeutic activities;Patient/family education    OT Goals(Current goals can be found in the care plan section) Acute Rehab OT Goals Patient Stated Goal:  (go home) OT Goal Formulation: With patient Time For Goal Achievement: 06/28/16 Potential to Achieve Goals: Good ADL Goals Pt Will Perform Grooming: with supervision;standing Pt Will Perform Upper Body Bathing: with supervision;with set-up;sitting Pt Will Perform Lower Body Bathing: with min assist;with adaptive equipment;sit to/from stand Pt Will Perform Upper Body Dressing: with min guard assist;sitting Pt Will Perform Lower Body Dressing: with min assist;with adaptive equipment;sit to/from stand Pt Will Transfer to Toilet: with min assist;ambulating Pt Will Perform Toileting - Clothing Manipulation and hygiene: with min assist;sit to/from stand  OT Frequency: Min 2X/week   Barriers to D/C:            Co-evaluation              End of Session    Activity Tolerance: Patient limited by pain Patient left: in bed;with call bell/phone within reach  OT Visit Diagnosis: Pain Pain - part of body:  (neck)                Time: 1610-9604 OT Time Calculation (min): 42 min Charges:  OT General Charges $OT Visit: 1 Procedure OT Evaluation $OT Eval Moderate Complexity: 1 Procedure OT Treatments $Self Care/Home Management : 8-22 mins G-Codes:     6 click score 14  Apolonio Cutting 06/14/2016, 10:27 AM

## 2016-06-14 NOTE — Progress Notes (Signed)
CM spoke to [place] at Dr Gus Height office and they would like a [facility] RN to assist with trouble shooting the wound vac but not to change the dressing. The office with change the dressing at his appointment on the 6th of April. Orders for [facility] RN and requested information sent to [facility] with Workers Comp. Message also left with him informing him of RN not to change wound vac only to assess and trouble shoot.  CM asked Dr Gus Height office about [facility] therapies. They felt if would be better for him to have follow up at the office prior to starting this. CM informed patient to ask about HH therapies at his next appointment. Pt agreeable.

## 2016-06-14 NOTE — Care Management Note (Addendum)
Case Management Note  Patient Details  Name: Jared Li MRN: [id number] Date of Birth: March 31, 1965  Subjective/Objective:   Pt s/p wound closure with vac application. Pt is from home with his brother. Pt is [employer]. CM spoke to Viviann Spare [phone]) the [facility] representative. He is asking for orders for [facility] services. CM called and left message for Dr  Ulice Bold since Dr Yevette Edwards stated she would be the one to determine [facility] RN for wound vac.   Pt already has the the [employer] wound vac for home attached.             Action/Plan: OT recommending HH services. Awaiting PT recs. CM following for d/c needs, physician orders. Plan is for patient to d/c tomorrow per orders.  Expected Discharge Date:  06/15/16               Expected Discharge Plan:  Home w Home Health Services  In-House Referral:     Discharge planning Services  CM Consult  Post Acute Care Choice:    Choice offered to:     DME Arranged:    DME Agency:     HH Arranged:    HH Agency:     Status of Service:  In process, will continue to follow  If discussed at Long Length of Stay Meetings, dates discussed:    Additional Comments:  Kermit Balo, RN 06/14/2016, 1:12 PM

## 2016-06-15 MED ORDER — SODIUM CHLORIDE 0.9% FLUSH: 3.0000 mL | INTRAVENOUS | Status: DC | PRN

## 2016-06-15 MED ORDER — SODIUM CHLORIDE 0.9% FLUSH: 3.0000 mL | Freq: Two times a day (BID) | INTRAVENOUS | Status: DC

## 2016-06-15 NOTE — Progress Notes (Signed)
Subjective: 2 Days Post-Op Procedure(s) (LRB): POSTERIOR CERVICAL DECOMPRESSION FUSION, CERVICAL 3-4, CERVICAL 4-5, CERVICAL 5-6, CERVICAL 6-7 WITH INSTRUMENTATION AND ALLOGRAFT (N/A) neck closure AFTER SPINE SURGERY WITH placement of wound vac. (N/A) Patient reports pain as moderate. Denies arm pain. Some tingling in his fingers.  Objective: Vital signs in last 24 hours: Temp:  [98.1 F (36.7 C)-99.4 F (37.4 C)] 99.4 F (37.4 C) (03/31 0539) Pulse Rate:  [79-94] 87 (03/31 0539) Resp:  [20] 20 (03/31 0539) BP: (114-136)/(68-111) 129/76 (03/31 0539) SpO2:  [93 %-100 %] 93 % (03/31 0539)  Intake/Output from previous day: 03/30 0701 - 03/31 0700 In: 517 [P.O.:240; I.V.:277] Out: 1750 [Urine:1750] Intake/Output this shift: Total I/O In: 7 [I.V.:7] Out: 900 [Urine:900]  No results for input(s): HGB in the last 72 hours. No results for input(s): WBC, RBC, HCT, PLT in the last 72 hours. No results for input(s): NA, K, CL, CO2, BUN, CREATININE, GLUCOSE, CALCIUM in the last 72 hours. No results for input(s): LABPT, INR in the last 72 hours. [place] collar is intact. Upper extremities without motor, sensory, or reflex deficits. Patient appears comfortable. He is alert and oriented.   Assessment/Plan: 2 Days Post-Op Procedure(s) (LRB): POSTERIOR CERVICAL DECOMPRESSION FUSION, CERVICAL 3-4, CERVICAL 4-5, CERVICAL 5-6, CERVICAL 6-7 WITH INSTRUMENTATION AND ALLOGRAFT (N/A) neck closure AFTER SPINE SURGERY WITH placement of wound vac. (N/A) Plan: Discharge home. He will continue on his usual pain medication with the addition of oxycodone IR 5 mg. Also given Rx for Valium 5 mg one every 8 hours as needed for spasm. He will have his VAC managed by Dr. Kittie Plater orders. This has been set up. Follow-up with Dr. Yevette Edwards in 10-12 days  Vickey Ewbank G 06/15/2016, 9:00 AM

## 2016-06-15 NOTE — Progress Notes (Signed)
Discharge instructions reviewed with patient and primary caregiver (patient's brother).  Reviewed were the following:  Medications, prescriptions, f/u MD appointments, activity and diet recommendations, wound vac management, infection control in the home, incision care, when to call the doctor, pain management, use of neck brace, and home health care expectations.  Patient discharged via private wheelchair to private vehicle accompanied by brother. Comprehension of information validated via "teach back" method.

## 2016-06-15 NOTE — Progress Notes (Signed)
Occupational Therapy Treatment Patient Details Name: Haakon Titsworth MRN: [id number] DOB: 1965/09/27 Today's Date: 06/15/2016    History of present illness PATIENT UNDERWENT TRAPEZIUS MUSCLE ADVANCEMENTOF CERVALCLOSURE. PNT HAS INCISIONAL VACK. PNT. PMH: ADJUSTMENT D/O, OA,CAUDA EQUINA SYNDROME, cervical radiculopathy, choonic pain,lumbar lami, carpeltunnel B, cervial fusion, laumbnar fusion, pain pump,spinalcord stimlator and has been removed.   OT comments  Pt eager to discharge home.  He and brother report he is close to his baseline.   All education completed.   Follow Up Recommendations  Home health OT    Equipment Recommendations  None recommended by OT    Recommendations for Other Services PT consult    Precautions / Restrictions Precautions Precautions: Cervical Precaution Comments: pt able to independently state precautions  Required Braces or Orthoses: Cervical Brace       Mobility Bed Mobility               General bed mobility comments: sitting EOB   Transfers Overall transfer level: Needs assistance Equipment used: Rolling walker (2 wheeled) Transfers: Sit to/from Stand;Lateral/Scoot Transfers Sit to Stand: Mod assist;+2 safety/equipment        Lateral/Scoot Transfers: Supervision General transfer comment: Pt requiresd mod A +2 to move to standing and demonstrated signficant LE clonus upon standing.  Pt and brother report this is normal for pt.  Pt was able to perform scoot transfer to w/c with supervision     Balance Overall balance assessment: Needs assistance Sitting-balance support: Feet supported;Single extremity supported Sitting balance-Leahy Scale: Fair     Standing balance support: Bilateral upper extremity supported Standing balance-Leahy Scale: Poor Standing balance comment: Pt requires mod A due to clonus when standing.  Brother assisted also.  Both pt and brother report that pt is close to his baseline with standing and balance.                             ADL either performed or assessed with clinical judgement   ADL                                         General ADL Comments: Pt sitting on EOB dressed with brother in room.  Pt states he dressed self this am.  He is able to verbalize precautions and how to change pads of cervical collar.   He has all DME, and he and brother both state they feel comfortable with current level of functioning      Vision       Perception     Praxis      Cognition Arousal/Alertness: Awake/alert Behavior During Therapy: WFL for tasks assessed/performed Overall Cognitive Status: Within Functional Limits for tasks assessed                                          Exercises     Shoulder Instructions       General Comments      Pertinent Vitals/ Pain       Pain Assessment: 0-10 Pain Score: 8  Pain Location: neck  Pain Descriptors / Indicators: Burning;Sharp;Spasm Pain Intervention(s): RN gave pain meds during session  Home Living  Prior Functioning/Environment              Frequency  Min 2X/week        Progress Toward Goals  OT Goals(current goals can now be found in the care plan section)  Progress towards OT goals: Progressing toward goals     Plan Discharge plan remains appropriate    Co-evaluation                 End of Session Equipment Utilized During Treatment: Gait belt;Rolling walker;Cervical collar  OT Visit Diagnosis: Pain   Activity Tolerance Patient tolerated treatment well   Patient Left in chair;with family/visitor present   Nurse Communication Mobility status        Time: 1610-9604 OT Time Calculation (min): 17 min  Charges: OT General Charges $OT Visit: 1 Procedure OT Treatments $Self Care/Home Management : 8-22 mins  [employer], OTR/L [phone]    Jeani Hawking M 06/15/2016, 2:48 PM

## 2016-06-17 ENCOUNTER — Encounter (HOSPITAL_COMMUNITY): Payer: Self-pay | Admitting: Orthopedic Surgery

## 2016-06-17 NOTE — Op Note (Signed)
NAME:  Jared Li, Jared Li NO.:  MEDICAL RECORD NO.:  [id number]  LOCATION:                                 FACILITY:  PHYSICIAN:  Estill Bamberg, MD           DATE OF BIRTH:  DATE OF PROCEDURE:  06/13/2016                              OPERATIVE REPORT   PREOPERATIVE DIAGNOSES: 1. Severe axial neck pain. 2. Status post multiple cervical procedures, including anterior     cervical fusions, as well as a posterior cervical fusion.  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  POSTOPERATIVE DIAGNOSES: 1. Severe axial neck pain. 2. Status post multiple cervical procedures, including anterior     cervical fusions, as well as a posterior cervical fusion.  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  PROCEDURE: 1. Posterior spinal fusion, C3-4, C4-5, C5-6, C6-7. 2. Removal and reinsertion of posterior spinal fixation, C5,     bilaterally. 3. Removal without reinsertion of fixation, C6 bilaterally. 4. Placement of posterior segmental instrumentation C3, C4, C7     bilaterally. 5. Bilateral C6-7 laminotomy with partial facetectomy. 6. Cranial tong application and removal. 7. Use of local autograft. 8. Use of morselized allograft-ViviGen. 9. Intraoperative use of fluoroscopy.  SURGEON:  Estill Bamberg, MD.  ASSISTANTJason Coop, PA-C.  ANESTHESIA:  General endotracheal anesthesia.  COMPLICATIONS:  None.  DISPOSITION:  Stable.  ESTIMATED BLOOD LOSS:  250 mL.  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  Of note, the patient did have an extensive cervical history, including anterior cervical fusions in  2007 and then again in 2009, followed by a posterior cervical fusion at C5-6 in 2009.  The patient did continue to have ongoing pain in his neck.  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  Given the patient's ongoing pain and dysfunction, we did discuss proceeding with a revision procedure as noted above.  The patient was fully aware of the risks and limitations and did elect to proceed.  Of particular note, the patient was aware that surgery did bring with it approximately a 50% chance of addressing his symptoms.  He is in chronic pain management, and it is my expectation that he will continue to require pain medication even after a successful posterior fusion.  Also of note, the patient did report that he was previously diagnosed with a MRSA infection, which did require surgery and antibiotics.  This certainly does increase his risk for a nonunion as well.  Of note, given the patient's attenuation of his skin posteriorly, as well as the diastasis of his posterior cervical paraspinal  musculature, I did recommend posterior closure of his wound by a plastic surgeon, Dr. Ulice Bold.  She did agree to proceed with closure of his wound.  OPERATIVE DETAILS:  On June 13, 2016, the patient was brought to surgery and general endotracheal anesthesia was administered.  A Mayfield head holder was then placed by me.  The patient was then rolled prone onto a hospital bed.  The patient's head was secured into the appropriate position and the head was locked to the Mayfield head holder attachment.  The patient's arms were secured to his sides and all bony prominences were meticulously padded.  The neck posterior was then prepped and draped in the usual sterile fashion.  The patient was given both Ancef and vancomycin, given his history of a MRSA infection.  After a time-out was performed, a  midline incision was made.  The fascia was incised at the midline.  The paraspinal musculature was bluntly swept laterally on the right and left sides and a self-retaining retractor was placed.  The previously identified hardware at C5 and C6 was identified. The cross connector and caps were then removed.  The C5 and C6 screws were then removed bilaterally.  A ball-tip probe was used to confirm that there was solid bone noted throughout the entirety of the screw hole.  I then suppressed and exposed the facet joints at C3-4, C4-5, C5- 6, as well as C6-7.  Using anatomic landmarks in addition to fluoroscopy, I did use a 1.7 mm bur, followed by a 2.5 mm drill to cannulate the lateral masses of C3 and C4 bilaterally.  A 3 mm tap was then used and was advanced to approximately 14 mm bilaterally.  A ball- tip probe was used to confirm that there was no cortical violation.  I then performed laminotomies and partial facetectomies bilaterally at C6- 7.  This did allow me the ability to palpate the superior and medial border of the C7 pedicles bilaterally.  This did also allow a thorough decompression of the neuro foramen on the right and left sides at C6-7, and did allow adequate decompression of the exiting bilateral C7 nerves. I then used a gearshift probe, followed by a 3.5 mm tap into the C7 pedicles bilaterally.  A ball-tip probe was used to confirm that there was no cortical violation.  At this point, I did decorticate the facet joints of the levels to be fused, at C3-4, C4-5, C5-6, as well as C6-7. A high-speed bur was also used to decorticate the lateral masses.  Where the previous C6 screws were removed, I did place bone wax.  Where the previous C5 screws were removed, I did upsized the screws from 3.5 mm diameter up to 4 mm diameter screws.  The 4 x 14 mm screws were then placed into the C5 lateral masses.  The 3.5 x 12 mm screws were placed into the C3 lateral masses and 3.5 x 14 mm  screws were placed into the C4 lateral masses.  I then packed DBX putty into the facet joints to be fused, as well as ViviGen.  The wound was copiously irrigated with approximately 2 L of normal saline prior to placing the bone graft.  I then placed a rod of the appropriate length into the tulip heads from C3- C7 bilaterally.  Caps were then placed and a final locking procedure was performed bilaterally.  I was very pleased with the final AP and lateral fluoroscopic images.  At this point, Dr. Ulice Bold scrubbed into the  case and did proceed with wound closure.  After the wound was closed, I did remove the patient's Mayfield head holder uneventfully.  All instrument counts were correct.  Of note, Jason Coop was my assistant throughout surgery, and did aid in retraction, suctioning, and closure from start to finish.     Estill Bamberg, MD     MD/MEDQ  D:  06/13/2016  T:  06/14/2016  Job:  [id number]

## 2016-07-03 NOTE — Discharge Summary (Signed)
Patient ID: Jared Li MRN: [id number] DOB/AGE: October 21, 1965 51 y.o.  Admit date: 06/13/2016 Discharge date: 06/15/2016  Admission Diagnoses:  Active Problems:   Radiculopathy   Discharge Diagnoses:  Same  Past Medical History:  Diagnosis Date  . Adjustment disorder   . Arthritis   . Asthma   . Cauda equina syndrome ([place])   . Cellulitis of lower limb   . Cervical radiculopathy   . Chronic intractable headache    Mirgraines- lst one 06/02/16  . Chronic pain   . Edema   . GERD (gastroesophageal reflux disease)   . History of kidney stones    passed  . Insomnia   . Loose stools   . Lumbar post-laminectomy syndrome   . MRSA bacteremia   . Open wound of lumbar region with complication   . Physical deconditioning   . Tobacco use disorder     Surgeries: Procedure(s): POSTERIOR CERVICAL DECOMPRESSION FUSION, CERVICAL 3-4, CERVICAL 4-5, CERVICAL 5-6, CERVICAL 6-7 WITH INSTRUMENTATION AND ALLOGRAFT neck closure AFTER SPINE SURGERY WITH placement of wound vac. on 06/13/2016   Consultants: Dr Marla Roe, Plastics  Discharged Condition: Improved  Hospital Course: Jared Li is an 51 y.o. male who was admitted 06/13/2016 for operative treatment of nonunion, malpositioned hardware. Patient has severe unremitting pain that affects sleep, daily activities, and work/hobbies. After pre-op clearance the patient was taken to the operating room on 06/13/2016 and underwent  Procedure(s): POSTERIOR CERVICAL DECOMPRESSION FUSION, CERVICAL 3-4, CERVICAL 4-5, CERVICAL 5-6, CERVICAL 6-7 WITH INSTRUMENTATION AND ALLOGRAFT neck closure AFTER SPINE SURGERY WITH placement of wound vac.Marland Kitchen    Patient was given perioperative antibiotics:  Anti-infectives    Start     Dose/Rate Route Frequency Ordered Stop   06/13/16 2000  ceFAZolin (ANCEF) IVPB 2g/100 mL premix     2 g 200 mL/hr over 30 Minutes Intravenous Every 8 hours 06/13/16 1906 06/14/16 0437   06/13/16 1115  vancomycin  (VANCOCIN) IVPB 1000 mg/200 mL premix     1,000 mg 200 mL/hr over 60 Minutes Intravenous To Surgery 06/13/16 1104 06/13/16 1207   06/13/16 0848  ceFAZolin (ANCEF) IVPB 2g/100 mL premix  Status:  Discontinued     2 g 200 mL/hr over 30 Minutes Intravenous On call to O.R. 06/13/16 0848 06/13/16 0849   06/13/16 0451  ceFAZolin (ANCEF) IVPB 2g/100 mL premix     2 g 200 mL/hr over 30 Minutes Intravenous On call to O.R. 06/13/16 0451 06/13/16 1044       Patient was given sequential compression devices, early ambulation to prevent DVT.  Patient benefited maximally from hospital stay and there were no complications.    Recent vital signs: BP 129/76 (BP Location: Right Arm)   Pulse 87   Temp 99.4 F (37.4 C) (Oral)   Resp 20   Ht _0  (1.803 m)   Wt 100.4 kg (221 lb 6.4 oz)   SpO2 93%   BMI 30.88 kg/m   Discharge Medications:   Allergies as of 06/15/2016      Reactions   Adhesive [tape] Other (See Comments)   Skin blisters   Codeine Nausea And Vomiting   Morphine And Related    Itching      Medication List    TAKE these medications   albuterol 108 (90 Base) MCG/ACT inhaler Commonly known as:  PROVENTIL HFA;VENTOLIN HFA Inhale 2 puffs into the lungs every 6 (six) hours as needed for wheezing or shortness of breath. Notes to patient:  None given this admission  diphenhydrAMINE 25 MG tablet Commonly known as:  BENADRYL Take 25 mg by mouth every 6 (six) hours as needed for itching. Notes to patient:  None given this admission   dronabinol 5 MG capsule Commonly known as:  MARINOL Take one capsule by mouth twice daily before meals What changed:  how much to take  how to take this  when to take this  additional instructions   ENSURE HIGH PROTEIN PO Take 1 Can by mouth 4 (four) times daily.   GAS-X PO Take 2 tablets by mouth 2 (two) times daily as needed (gas). Notes to patient:  Not given this admission   GRALISE 600 MG Tabs Generic drug:  Gabapentin  (Once-Daily) Take 3,000 mg by mouth daily with supper. Takes 5 tablets at dinner   HYDROmorphone HCl 16 MG T24a Take 16 mg by mouth 2 (two) times daily.   imipramine 10 MG tablet Commonly known as:  TOFRANIL Take 10 mg by mouth at bedtime.   multivitamin with minerals Tabs tablet Take 1 tablet by mouth daily.   omeprazole 20 MG tablet Commonly known as:  PRILOSEC OTC Take 20 mg by mouth daily as needed.   ondansetron 4 MG tablet Commonly known as:  ZOFRAN Take 4 mg by mouth every 8 (eight) hours as needed for nausea or vomiting.   Oxycodone HCl 10 MG Tabs Take two tablets by mouth every 4 hours as needed for pain What changed:  how much to take  when to take this  reasons to take this  additional instructions Notes to patient:  Take with 5 mg. Tablet if needed to equal 15 mg.   oxyCODONE 5 MG immediate release tablet Commonly known as:  Oxy IR/ROXICODONE Take 1 tablet (5 mg total) by mouth every 4 (four) hours as needed for severe pain. What changed:  You were already taking a medication with the same name, and this prescription was added. Make sure you understand how and when to take each.   oxyCODONE-acetaminophen 10-325 MG tablet Commonly known as:  PERCOCET Take 1 tablet by mouth every 6 (six) hours as needed for pain. for pain Notes to patient:  Caution:  Contains 10 mg. Of oxycodone   rizatriptan 10 MG tablet Commonly known as:  MAXALT Take 10 mg by mouth every 2 (two) hours as needed for migraine. May repeat in 2 hours if needed Notes to patient:  Imitrex given at 9:00 am 06/15/2016   sertraline 100 MG tablet Commonly known as:  ZOLOFT Take 100 mg by mouth at bedtime. Takes 2 tablets   vitamin C 500 MG tablet Commonly known as:  ASCORBIC ACID Take 500 mg by mouth 2 (two) times daily.   zinc sulfate 220 (50 Zn) MG capsule Take 220 mg by mouth daily.       Diagnostic Studies: Dg Chest 2 View  Result Date: 06/04/2016 CLINICAL DATA:  Preop for  cervical fusion EXAM: CHEST  2 VIEW COMPARISON:  None. FINDINGS: Cardiomediastinal silhouette is unremarkable. No infiltrate or pleural effusion. No pulmonary edema. Bony thorax is unremarkable. IMPRESSION: No active cardiopulmonary disease. Electronically Signed   By: Lahoma Crocker M.D.   On: 06/04/2016 17:11   Dg Cervical Spine 1 View  Result Date: 06/13/2016 CLINICAL DATA:  Evaluation for intraoperative localization. 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 EXAM: CERVICAL SPINE 1 VIEW COMPARISON:  Prior MRI from 04/05/2016. FINDINGS: Single lateral view of the cervical spine demonstrates multiple surgical instruments at the posterior neck. Surgical trocar overlies the posterior elements at  the C4-5 level. Additional trocar projects over the C7 spinous process. Patient is status post ACDF at C3 through C5, with posterior transpedicular screw placement C5. Alignment normal. Endotracheal and enteric tubes noted. 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 Surgical instrument projects over the posterior elements at the C4-5 level. Electronically Signed   By: Jeannine Boga M.D.   On: 06/13/2016 15:37   Dg Cervical Spine 2-3 Views  Result Date: 06/13/2016 CLINICAL DATA:  Post C3-C7 posterior fusion EXAM: DG C-ARM 61-120 MIN; CERVICAL SPINE - 2-3 VIEW COMPARISON:  06/13/2016 FINDINGS: Three views of the cervical spine submitted. Again noted anterior fusion with metallic plate and screws at C3, C4-C5 level. There is new posterior metallic fusion. Transpedicular screws are noted C3, C4 and C5 level. Transpedicular screws are noted C7 level. Metallic rods noted L8-X2 level. There is anatomic alignment. Fluoroscopy time was 12 seconds.  Please see the operative report. IMPRESSION: Again noted anterior fusion with metallic plate and screws at C3, C4-C5 level. There is new posterior metallic fusion. Transpedicular screws are noted C3, C4 and C5 level. Transpedicular screws are noted C7 level. Metallic rods noted  J1-H4 level. There is anatomic alignment. Fluoroscopy time was 12 seconds.  Please see the operative report. Electronically Signed   By: Lahoma Crocker M.D.   On: 06/13/2016 16:59   Dg C-arm 1-60 Min  Result Date: 06/13/2016 CLINICAL DATA:  Post C3-C7 posterior fusion EXAM: DG C-ARM 61-120 MIN; CERVICAL SPINE - 2-3 VIEW COMPARISON:  06/13/2016 FINDINGS: Three views of the cervical spine submitted. Again noted anterior fusion with metallic plate and screws at C3, C4-C5 level. There is new posterior metallic fusion. Transpedicular screws are noted C3, C4 and C5 level. Transpedicular screws are noted C7 level. Metallic rods noted R7-E0 level. There is anatomic alignment. Fluoroscopy time was 12 seconds.  Please see the operative report. IMPRESSION: Again noted anterior fusion with metallic plate and screws at C3, C4-C5 level. There is new posterior metallic fusion. Transpedicular screws are noted C3, C4 and C5 level. Transpedicular screws are noted C7 level. Metallic rods noted C1-K4 level. There is anatomic alignment. Fluoroscopy time was 12 seconds.  Please see the operative report. Electronically Signed   By: Lahoma Crocker M.D.   On: 06/13/2016 16:59    Disposition: 01-Home or Self Care  Discharge Instructions    Diet - low sodium heart healthy    Complete by:  As directed    Increase activity slowly    Complete by:  As directed      POD #2 s/p C2-7 PCF doing well with expected postop neck pain  - encourage ambulation - Oxycondone, dilaudid for pain (per pervious pain MD's recs, and will add on 73m oxycodone to take at home), Valium for muscle spasms - woundvac per plastics -Written scripts for pain signed and in chart -D/C instructions sheet printed and in chart -D/C today  -F/U in office 2 weeks   Signed: MJustice Britain4/18/2018, 11:44 AM

## 2016-08-17 NOTE — Addendum Note (Signed)
Addendum  created 08/17/16 1103 by Coriana Angello, MD   Sign clinical note    

## 2016-12-06 DIAGNOSIS — I10 Essential (primary) hypertension: Secondary | ICD-10-CM | POA: Diagnosis not present

## 2016-12-06 DIAGNOSIS — E291 Testicular hypofunction: Secondary | ICD-10-CM | POA: Diagnosis not present

## 2016-12-18 DIAGNOSIS — F411 Generalized anxiety disorder: Secondary | ICD-10-CM | POA: Diagnosis not present

## 2016-12-18 DIAGNOSIS — R3915 Urgency of urination: Secondary | ICD-10-CM | POA: Diagnosis not present

## 2016-12-18 DIAGNOSIS — R3911 Hesitancy of micturition: Secondary | ICD-10-CM | POA: Diagnosis not present

## 2016-12-18 DIAGNOSIS — G834 Cauda equina syndrome: Secondary | ICD-10-CM | POA: Diagnosis not present

## 2016-12-18 DIAGNOSIS — M4322 Fusion of spine, cervical region: Secondary | ICD-10-CM | POA: Diagnosis not present

## 2016-12-18 DIAGNOSIS — K30 Functional dyspepsia: Secondary | ICD-10-CM | POA: Diagnosis not present

## 2016-12-18 DIAGNOSIS — R1084 Generalized abdominal pain: Secondary | ICD-10-CM | POA: Diagnosis not present

## 2016-12-18 DIAGNOSIS — F321 Major depressive disorder, single episode, moderate: Secondary | ICD-10-CM | POA: Diagnosis not present

## 2017-04-09 DIAGNOSIS — L02212 Cutaneous abscess of back [any part, except buttock]: Secondary | ICD-10-CM | POA: Diagnosis not present

## 2017-04-09 DIAGNOSIS — B020 Zoster encephalitis: Secondary | ICD-10-CM | POA: Diagnosis not present

## 2017-06-05 ENCOUNTER — Encounter: Payer: Self-pay | Admitting: Gastroenterology

## 2017-06-23 DIAGNOSIS — R112 Nausea with vomiting, unspecified: Secondary | ICD-10-CM | POA: Diagnosis not present

## 2017-06-23 DIAGNOSIS — G834 Cauda equina syndrome: Secondary | ICD-10-CM | POA: Diagnosis not present

## 2017-06-23 DIAGNOSIS — G894 Chronic pain syndrome: Secondary | ICD-10-CM | POA: Diagnosis not present

## 2017-06-23 DIAGNOSIS — G43909 Migraine, unspecified, not intractable, without status migrainosus: Secondary | ICD-10-CM | POA: Diagnosis not present

## 2017-06-23 DIAGNOSIS — E291 Testicular hypofunction: Secondary | ICD-10-CM | POA: Diagnosis not present

## 2017-08-13 ENCOUNTER — Ambulatory Visit: Payer: Self-pay | Admitting: Nurse Practitioner

## 2018-01-18 DIAGNOSIS — Z8639 Personal history of other endocrine, nutritional and metabolic disease: Secondary | ICD-10-CM | POA: Insufficient documentation

## 2018-01-18 DIAGNOSIS — E291 Testicular hypofunction: Secondary | ICD-10-CM | POA: Insufficient documentation

## 2020-01-07 ENCOUNTER — Other Ambulatory Visit: Payer: Self-pay

## 2020-01-07 ENCOUNTER — Ambulatory Visit (INDEPENDENT_AMBULATORY_CARE_PROVIDER_SITE_OTHER): Payer: Self-pay

## 2020-01-07 ENCOUNTER — Ambulatory Visit
Admission: EM | Admit: 2020-01-07 | Discharge: 2020-01-07 | Disposition: A | Discharge status: Home / Self Care | Payer: Medicare Other | Attending: Emergency Medicine | Admitting: Emergency Medicine

## 2020-01-07 DIAGNOSIS — M79671 Pain in right foot: Secondary | ICD-10-CM

## 2020-01-07 DIAGNOSIS — S99921A Unspecified injury of right foot, initial encounter: Secondary | ICD-10-CM

## 2020-01-07 DIAGNOSIS — S92351A Displaced fracture of fifth metatarsal bone, right foot, initial encounter for closed fracture: Secondary | ICD-10-CM

## 2020-01-07 DIAGNOSIS — M7989 Other specified soft tissue disorders: Secondary | ICD-10-CM

## 2020-01-07 MED ORDER — IBUPROFEN 800 MG PO TABS: 800.0000 mg | ORAL_TABLET | Freq: Three times a day (TID) | ORAL | 0 refills | Status: DC

## 2020-01-07 NOTE — ED Triage Notes (Signed)
Pt presents with right foot injury from fall out of wheelchair on Sunday, pt can transfer and ambulate with assistance

## 2020-01-07 NOTE — Discharge Instructions (Addendum)
Take OTC ibuprofen as needed for pain Follow-up with orthopedic Follow RICE instruction that is attached Follow-up PCP Return or go to ED for worsening symptoms

## 2020-01-07 NOTE — ED Provider Notes (Signed)
[facility]   [id number] 01/07/20 Arrival Time: 1457   Chief Complaint  Patient presents with  . Foot Injury     SUBJECTIVE: History from: patient.  Jared Li is a 54 y.o. male w who presented to the urgent care with a complaint of right foot injury that occurred this past Sunday.  States that he fell out of the wheelchair.  He localizes the pain to the right foot he describes the pain as constant and achy.  He has tried OTC medications without relief.  His symptoms are made worse with ROM.  He denies similar symptoms in the past.  Denies chills, fever, nausea, vomiting, diarrhea), confusion, LOC.  ROS: As per HPI.  All other pertinent ROS negative.      Past Medical History:  Diagnosis Date  . Adjustment disorder   . Arthritis   . Asthma   . Cauda equina syndrome (HCC)   . Cellulitis of lower limb   . Cervical radiculopathy   . Chronic intractable headache    Mirgraines- lst one 06/02/16  . Chronic pain   . Edema   . GERD (gastroesophageal reflux disease)   . History of kidney stones    passed  . Insomnia   . Loose stools   . Lumbar post-laminectomy syndrome   . MRSA bacteremia   . Open wound of lumbar region with complication   . Physical deconditioning   . Tobacco use disorder    Past Surgical History:  Procedure Laterality Date  . APPLICATION OF A-CELL OF HEAD/NECK N/A 06/13/2016   Procedure: neck closure AFTER SPINE SURGERY WITH placement of wound vac.;  Surgeon: Alena Bills Dillingham, DO;  Location: [facility] OR;  Service: Plastics;  Laterality: N/A;  . CARPAL TUNNEL RELEASE Bilateral   . CERVICAL FUSION     Numerous  . LUMBAR FUSION     Numerous  . Pain Pump insertion     x 2  . POSTERIOR CERVICAL FUSION/FORAMINOTOMY N/A 06/13/2016   Procedure: POSTERIOR CERVICAL DECOMPRESSION FUSION, CERVICAL 3-4, CERVICAL 4-5, CERVICAL 5-6, CERVICAL 6-7 WITH INSTRUMENTATION AND ALLOGRAFT;  Surgeon: Estill Bamberg, MD;  Location: [facility] OR;  Service: Orthopedics;   Laterality: N/A;  POSTERIOR CERVICAL DECOMPRESSION FUSION, CERVICAL 3-4, CERVICAL 4-5, CERVICAL 5-6, CERVICAL 6-7 WITH INSTRUMENTATION AND ALLOGRAFT  . POSTERIOR LAMINECTOMY / DECOMPRESSION LUMBAR SPINE    . removal of pain pump     x 2  . Spinal Card Stimulator Removal      x 2  . SPINAL CORD STIMULATOR INSERTION      x2   Allergies  Allergen Reactions  . Adhesive [Tape] Other (See Comments)    Skin blisters  . Codeine Nausea And Vomiting  . Morphine And Related     Itching    No current facility-administered medications on file prior to encounter.   Current Outpatient Medications on File Prior to Encounter  Medication Sig Dispense Refill  . albuterol (PROVENTIL HFA;VENTOLIN HFA) 108 (90 Base) MCG/ACT inhaler Inhale 2 puffs into the lungs every 6 (six) hours as needed for wheezing or shortness of breath.    . diphenhydrAMINE (BENADRYL) 25 MG tablet Take 25 mg by mouth every 6 (six) hours as needed for itching.     . dronabinol (MARINOL) 5 MG capsule Take one capsule by mouth twice daily before meals (Patient taking differently: Take 5 mg by mouth 3 (three) times daily before meals. ) 60 capsule 0  . GRALISE 600 MG TABS Take 3,000 mg by mouth daily with  supper. Takes 5 tablets at dinner  5  . HYDROmorphone HCl 16 MG T24A Take 16 mg by mouth 2 (two) times daily.  0  . imipramine (TOFRANIL) 10 MG tablet Take 10 mg by mouth at bedtime.    . Multiple Vitamin (MULTIVITAMIN WITH MINERALS) TABS tablet Take 1 tablet by mouth daily.    . Nutritional Supplements (ENSURE HIGH PROTEIN PO) Take 1 Can by mouth 4 (four) times daily.    Marland Kitchen omeprazole (PRILOSEC OTC) 20 MG tablet Take 20 mg by mouth daily as needed.    . ondansetron (ZOFRAN) 4 MG tablet Take 4 mg by mouth every 8 (eight) hours as needed for nausea or vomiting.    Marland Kitchen oxyCODONE (OXY IR/ROXICODONE) 5 MG immediate release tablet Take 1 tablet (5 mg total) by mouth every 4 (four) hours as needed for severe pain. 40 tablet 0  . Oxycodone HCl  10 MG TABS Take two tablets by mouth every 4 hours as needed for pain (Patient taking differently: 15 mg every 6 (six) hours as needed. Take two tablets by mouth every 4 hours as needed for pain) 360 tablet 0  . oxyCODONE-acetaminophen (PERCOCET) 10-325 MG tablet Take 1 tablet by mouth every 6 (six) hours as needed for pain. for pain  0  . rizatriptan (MAXALT) 10 MG tablet Take 10 mg by mouth every 2 (two) hours as needed for migraine. May repeat in 2 hours if needed     . sertraline (ZOLOFT) 100 MG tablet Take 100 mg by mouth at bedtime. Takes 2 tablets    . Simethicone (GAS-X PO) Take 2 tablets by mouth 2 (two) times daily as needed (gas).    . vitamin C (ASCORBIC ACID) 500 MG tablet Take 500 mg by mouth 2 (two) times daily.    Marland Kitchen zinc sulfate 220 (50 Zn) MG capsule Take 220 mg by mouth daily.     Social History   Socioeconomic History  . Marital status: Divorced    Spouse name: Not on file  . Number of children: Not on file  . Years of education: Not on file  . Highest education level: Not on file  Occupational History  . Not on file  Tobacco Use  . Smoking status: Former Smoker    Years: 35.00    Types: Cigarettes    Quit date: 06/02/2016    Years since quitting: 3.6  . Smokeless tobacco: Former Systems developer  . Tobacco comment: used chewing tabacco as a child  Substance and Sexual Activity  . Alcohol use: No    Alcohol/week: 0.0 standard drinks    Comment: occasional  . Drug use: No  . Sexual activity: Not on file  Other Topics Concern  . Not on file  Social History Narrative  . Not on file   Social Determinants of Health   Financial Resource Strain:   . Difficulty of Paying Living Expenses: Not on file  Food Insecurity:   . Worried About Charity fundraiser in the Last Year: Not on file  . Ran Out of Food in the Last Year: Not on file  Transportation Needs:   . Lack of Transportation (Medical): Not on file  . Lack of Transportation (Non-Medical): Not on file  Physical  Activity:   . Days of Exercise per Week: Not on file  . Minutes of Exercise per Session: Not on file  Stress:   . Feeling of Stress : Not on file  Social Connections:   . Frequency of Communication  with Friends and Family: Not on file  . Frequency of Social Gatherings with Friends and Family: Not on file  . Attends Religious Services: Not on file  . Active Member of Clubs or Organizations: Not on file  . Attends Archivist Meetings: Not on file  . Marital Status: Not on file  Intimate Partner Violence:   . Fear of Current or Ex-Partner: Not on file  . Emotionally Abused: Not on file  . Physically Abused: Not on file  . Sexually Abused: Not on file   Family History  Family history unknown: Yes    OBJECTIVE:  There were no vitals filed for this visit.   Physical Exam Vitals and nursing note reviewed.  Constitutional:      General: He is not in acute distress.    Appearance: Normal appearance. He is normal weight. He is not ill-appearing, toxic-appearing or diaphoretic.  Cardiovascular:     Rate and Rhythm: Normal rate and regular rhythm.     Pulses: Normal pulses.     Heart sounds: Normal heart sounds. No murmur heard.  No friction rub. No gallop.   Pulmonary:     Effort: Pulmonary effort is normal. No respiratory distress.     Breath sounds: Normal breath sounds. No stridor. No wheezing, rhonchi or rales.  Chest:     Chest wall: No tenderness.  Musculoskeletal:     Cervical back: Tenderness present.     Right foot: Swelling and tenderness present.     Left foot: Normal.     Comments: The right foot is with obvious deformity when compared to the left foot.  Swelling is present.  There is no ecchymosis, open wound, lesion, warmth, hematoma present.  Limited range of motion due to pain and swelling.  Neurovascular status intact.  Neurological:     Mental Status: He is alert and oriented to person, place, and time.     LABS:  No results found for this or any  previous visit (from the past 24 hour(s)).   RADIOLOGY:  DG Foot Complete Right  Result Date: 01/07/2020 CLINICAL DATA:  Injury, fell out of wheelchair over the weekend, RIGHT foot pain and swelling laterally EXAM: RIGHT FOOT COMPLETE - 3+ VIEW COMPARISON:  None FINDINGS: Osseous demineralization. Joint spaces preserved. Nondisplaced oblique fracture through distal diaphysis of RIGHT fifth metatarsal. No additional fracture, dislocation, or bone destruction. Soft tissue swelling overlying the dorsum of the foot at the level of the distal metatarsals. Small Achilles insertion calcaneal spur. IMPRESSION: Nondisplaced oblique fracture of distal RIGHT fifth metatarsal diaphysis. Electronically Signed   By: Lavonia Dana M.D.   On: 01/07/2020 15:42   Right foot x-ray is positive for nondisplaced oblique fracture of the distal right fifth metatarsal.  I have reviewed the x-ray myself and the radiologist interpretation.  I am in agreement with the radiologist interpretation.    ASSESSMENT & PLAN:  1. Right foot pain   2. Closed fracture of base of fifth metatarsal bone of right foot, initial encounter     Meds ordered this encounter  Medications  . ibuprofen (ADVIL) 800 MG tablet    Sig: Take 1 tablet (800 mg total) by mouth 3 (three) times daily.    Dispense:  30 tablet    Refill:  0    Discharge instructions  Take OTC ibuprofen as needed for pain Follow-up with orthopedic Follow RICE instruction that is attached Follow-up PCP Return or go to ED for worsening symptoms  Reviewed expectations re:  course of current medical issues. Questions answered. Outlined signs and symptoms indicating need for more acute intervention. Patient verbalized understanding. After Visit Summary given.         Emerson Monte, FNP 01/07/20 1556

## 2020-01-13 ENCOUNTER — Other Ambulatory Visit: Payer: Self-pay

## 2020-01-13 ENCOUNTER — Ambulatory Visit (INDEPENDENT_AMBULATORY_CARE_PROVIDER_SITE_OTHER): Payer: Medicare Other | Admitting: Orthopedic Surgery

## 2020-01-13 VITALS — BP 137/91 | HR 64 | Ht 70.0 in | Wt 165.0 lb

## 2020-01-13 DIAGNOSIS — S92354A Nondisplaced fracture of fifth metatarsal bone, right foot, initial encounter for closed fracture: Secondary | ICD-10-CM

## 2020-01-13 NOTE — Patient Instructions (Signed)
Wear hard sole shoe for 6 weeks

## 2020-01-13 NOTE — Progress Notes (Signed)
Jared Li  01/13/2020  Body mass index is 23.68 kg/m.  MEDICAL DECISION SECTION:  No diagnosis found.  Imaging Outside imaging of the right foot there is a nondisplaced fracture of the fifth metatarsal its spiral linear  Plan:  (Rx., Inj., surg., Frx, MRI/CT, XR:2)  Immobilization protected weightbearing  X-ray and 6 weeks   HISTORY SECTION :  Chief Complaint  Patient presents with  . Foot Injury    right     HPI 53 MALE BIG CUBS FAN The patient presents for evaluation of right foot injury.  Patient injured his right foot on October 22.  Presented to urgent care was diagnosed with 1/5 metatarsal fracture.  Patient fell out of his wheelchair complains of MILD pain over his right fifth metatarsal   Review of Systems  Gastrointestinal: Positive for nausea.  Musculoskeletal: Positive for back pain and neck pain.      has a past medical history of Adjustment disorder, Arthritis, Asthma, Cauda equina syndrome (HCC), Cellulitis of lower limb, Cervical radiculopathy, Chronic intractable headache, Chronic pain, Edema, GERD (gastroesophageal reflux disease), History of kidney stones, Insomnia, Loose stools, Lumbar post-laminectomy syndrome, MRSA bacteremia, Open wound of lumbar region with complication, Physical deconditioning, and Tobacco use disorder.    Past Surgical History:  Procedure Laterality Date  . APPLICATION OF A-CELL OF HEAD/NECK N/A 06/13/2016   Procedure: neck closure AFTER SPINE SURGERY WITH placement of wound vac.;  Surgeon: Alena Bills Dillingham, DO;  Location: [facility] OR;  Service: Plastics;  Laterality: N/A;  . CARPAL TUNNEL RELEASE Bilateral   . CERVICAL FUSION     Numerous  . LUMBAR FUSION     Numerous  . Pain Pump insertion     x 2  . POSTERIOR CERVICAL FUSION/FORAMINOTOMY N/A 06/13/2016   Procedure: POSTERIOR CERVICAL DECOMPRESSION FUSION, CERVICAL 3-4, CERVICAL 4-5, CERVICAL 5-6, CERVICAL 6-7 WITH INSTRUMENTATION AND ALLOGRAFT;  Surgeon: Estill Bamberg, MD;  Location: [facility] OR;  Service: Orthopedics;  Laterality: N/A;  POSTERIOR CERVICAL DECOMPRESSION FUSION, CERVICAL 3-4, CERVICAL 4-5, CERVICAL 5-6, CERVICAL 6-7 WITH INSTRUMENTATION AND ALLOGRAFT  . POSTERIOR LAMINECTOMY / DECOMPRESSION LUMBAR SPINE    . removal of pain pump     x 2  . Spinal Card Stimulator Removal      x 2  . SPINAL CORD STIMULATOR INSERTION      x2     Social History   Tobacco Use  . Smoking status: Former Smoker    Years: 35.00    Types: Cigarettes    Quit date: 06/02/2016    Years since quitting: 3.6  . Smokeless tobacco: Former Neurosurgeon  . Tobacco comment: used chewing tabacco as a child  Substance Use Topics  . Alcohol use: No    Alcohol/week: 0.0 standard drinks    Comment: occasional  . Drug use: No       Allergies  Allergen Reactions  . Adhesive [Tape] Other (See Comments)    Skin blisters  . Codeine Nausea And Vomiting  . Morphine And Related     Itching      Current Outpatient Medications:  .  albuterol (PROVENTIL HFA;VENTOLIN HFA) 108 (90 Base) MCG/ACT inhaler, Inhale 2 puffs into the lungs every 6 (six) hours as needed for wheezing or shortness of breath., Disp: , Rfl:  .  cloNIDine (CATAPRES) 0.1 MG tablet, Take 0.1 mg by mouth 2 (two) times daily., Disp: , Rfl:  .  diphenhydrAMINE (BENADRYL) 25 MG tablet, Take 25 mg by mouth every 6 (six) hours as needed  for itching. , Disp: , Rfl:  .  dronabinol (MARINOL) 5 MG capsule, Take one capsule by mouth twice daily before meals (Patient taking differently: Take 5 mg by mouth 3 (three) times daily before meals. ), Disp: 60 capsule, Rfl: 0 .  GRALISE 600 MG TABS, Take 3,000 mg by mouth daily with supper. Takes 5 tablets at dinner, Disp: , Rfl: 5 .  HYDROmorphone HCl 16 MG T24A, Take 16 mg by mouth 2 (two) times daily., Disp: , Rfl: 0 .  ibuprofen (ADVIL) 800 MG tablet, Take 1 tablet (800 mg total) by mouth 3 (three) times daily., Disp: 30 tablet, Rfl: 0 .  imipramine (TOFRANIL) 10 MG  tablet, Take 10 mg by mouth at bedtime., Disp: , Rfl:  .  LORazepam (ATIVAN) 0.5 MG tablet, Take 0.5 mg by mouth 2 (two) times daily as needed., Disp: , Rfl:  .  Multiple Vitamin (MULTIVITAMIN WITH MINERALS) TABS tablet, Take 1 tablet by mouth daily., Disp: , Rfl:  .  Nutritional Supplements (ENSURE HIGH PROTEIN PO), Take 1 Can by mouth 4 (four) times daily., Disp: , Rfl:  .  omeprazole (PRILOSEC OTC) 20 MG tablet, Take 20 mg by mouth daily as needed., Disp: , Rfl:  .  ondansetron (ZOFRAN) 4 MG tablet, Take 4 mg by mouth every 8 (eight) hours as needed for nausea or vomiting., Disp: , Rfl:  .  oxyCODONE (OXY IR/ROXICODONE) 5 MG immediate release tablet, Take 1 tablet (5 mg total) by mouth every 4 (four) hours as needed for severe pain., Disp: 40 tablet, Rfl: 0 .  Oxycodone HCl 10 MG TABS, Take two tablets by mouth every 4 hours as needed for pain (Patient taking differently: 15 mg every 6 (six) hours as needed. Take two tablets by mouth every 4 hours as needed for pain), Disp: 360 tablet, Rfl: 0 .  oxyCODONE-acetaminophen (PERCOCET) 10-325 MG tablet, Take 1 tablet by mouth every 6 (six) hours as needed for pain. for pain, Disp: , Rfl: 0 .  rizatriptan (MAXALT) 10 MG tablet, Take 10 mg by mouth every 2 (two) hours as needed for migraine. May repeat in 2 hours if needed , Disp: , Rfl:  .  sertraline (ZOLOFT) 100 MG tablet, Take 100 mg by mouth at bedtime. Takes 2 tablets, Disp: , Rfl:  .  Simethicone (GAS-X PO), Take 2 tablets by mouth 2 (two) times daily as needed (gas)., Disp: , Rfl:  .  vitamin C (ASCORBIC ACID) 500 MG tablet, Take 500 mg by mouth 2 (two) times daily., Disp: , Rfl:  .  zinc sulfate 220 (50 Zn) MG capsule, Take 220 mg by mouth daily., Disp: , Rfl:    PHYSICAL EXAM SECTION: BP (!) 137/91   Pulse 64   Ht 5\' 10"  (1.778 m)   Wt 165 lb (74.8 kg)   BMI 23.68 kg/m   Body mass index is 23.68 kg/m.   General appearance: Well-developed well-nourished no gross deformities  Lymph  nodes: No lymphadenopathy   Cardiovascular normal pulse and perfusion normal color without edema  Neurologically  no sensation loss or deficits right foot Psychological: Awake alert and oriented x3 mood and affect normal  Skin no lacerations or ulcerations no nodularity no palpable masses, no erythema or nodularity  Musculoskeletal: Right foot is tender over the fifth metatarsal there is no swelling.  The skin is intact is no atrophy in the foot I can move his toes normally.  11:04 AM

## 2020-01-25 DIAGNOSIS — G834 Cauda equina syndrome: Secondary | ICD-10-CM | POA: Diagnosis not present

## 2020-01-25 DIAGNOSIS — R112 Nausea with vomiting, unspecified: Secondary | ICD-10-CM | POA: Diagnosis not present

## 2020-01-25 DIAGNOSIS — G43909 Migraine, unspecified, not intractable, without status migrainosus: Secondary | ICD-10-CM | POA: Diagnosis not present

## 2020-01-25 DIAGNOSIS — E782 Mixed hyperlipidemia: Secondary | ICD-10-CM | POA: Diagnosis not present

## 2020-01-25 DIAGNOSIS — E559 Vitamin D deficiency, unspecified: Secondary | ICD-10-CM | POA: Diagnosis not present

## 2020-01-25 DIAGNOSIS — G894 Chronic pain syndrome: Secondary | ICD-10-CM | POA: Diagnosis not present

## 2020-01-27 ENCOUNTER — Ambulatory Visit: Payer: Medicare Other

## 2020-01-27 ENCOUNTER — Ambulatory Visit (INDEPENDENT_AMBULATORY_CARE_PROVIDER_SITE_OTHER): Payer: Medicare Other | Admitting: Orthopedic Surgery

## 2020-01-27 ENCOUNTER — Encounter: Payer: Self-pay | Admitting: Orthopedic Surgery

## 2020-01-27 ENCOUNTER — Other Ambulatory Visit: Payer: Self-pay

## 2020-01-27 DIAGNOSIS — S92354D Nondisplaced fracture of fifth metatarsal bone, right foot, subsequent encounter for fracture with routine healing: Secondary | ICD-10-CM | POA: Insufficient documentation

## 2020-01-27 DIAGNOSIS — S92354A Nondisplaced fracture of fifth metatarsal bone, right foot, initial encounter for closed fracture: Secondary | ICD-10-CM

## 2020-01-27 NOTE — Progress Notes (Signed)
Encounter Diagnosis  Name Primary?  . Closed nondisplaced fracture of fifth metatarsal bone of right foot with routine healing, subsequent encounter 01/07/20  Yes    Chief Complaint  Patient presents with  . Foot Injury    01/07/20 right foot    Fifth metatarsal right foot fracture x-ray follow-up  X-rays show no change in position fracture noted shaft metaphyseal region distally  Stable come back for 5 weeks x-ray again

## 2020-02-04 DIAGNOSIS — G894 Chronic pain syndrome: Secondary | ICD-10-CM | POA: Diagnosis not present

## 2020-02-04 DIAGNOSIS — E559 Vitamin D deficiency, unspecified: Secondary | ICD-10-CM | POA: Diagnosis not present

## 2020-02-04 DIAGNOSIS — N1832 Chronic kidney disease, stage 3b: Secondary | ICD-10-CM | POA: Diagnosis not present

## 2020-02-04 DIAGNOSIS — E782 Mixed hyperlipidemia: Secondary | ICD-10-CM | POA: Diagnosis not present

## 2020-02-04 DIAGNOSIS — Z23 Encounter for immunization: Secondary | ICD-10-CM | POA: Diagnosis not present

## 2020-02-07 ENCOUNTER — Other Ambulatory Visit (HOSPITAL_COMMUNITY): Payer: Self-pay | Admitting: Internal Medicine

## 2020-02-07 DIAGNOSIS — E559 Vitamin D deficiency, unspecified: Secondary | ICD-10-CM

## 2020-02-22 ENCOUNTER — Encounter (INDEPENDENT_AMBULATORY_CARE_PROVIDER_SITE_OTHER): Payer: Self-pay | Admitting: *Deleted

## 2020-03-01 DIAGNOSIS — Z0001 Encounter for general adult medical examination with abnormal findings: Secondary | ICD-10-CM | POA: Diagnosis not present

## 2020-03-01 DIAGNOSIS — E559 Vitamin D deficiency, unspecified: Secondary | ICD-10-CM | POA: Diagnosis not present

## 2020-03-02 ENCOUNTER — Ambulatory Visit: Payer: Medicare Other | Admitting: Orthopedic Surgery

## 2020-03-20 ENCOUNTER — Ambulatory Visit: Payer: Medicare Other | Admitting: Orthopedic Surgery

## 2020-04-06 ENCOUNTER — Ambulatory Visit: Payer: Medicare Other | Admitting: Orthopedic Surgery

## 2020-04-06 ENCOUNTER — Encounter: Payer: Self-pay | Admitting: Orthopedic Surgery

## 2020-04-21 DIAGNOSIS — G894 Chronic pain syndrome: Secondary | ICD-10-CM | POA: Diagnosis not present

## 2020-04-21 DIAGNOSIS — K056 Periodontal disease, unspecified: Secondary | ICD-10-CM | POA: Diagnosis not present

## 2020-05-22 DIAGNOSIS — R059 Cough, unspecified: Secondary | ICD-10-CM | POA: Diagnosis not present

## 2020-05-22 DIAGNOSIS — R519 Headache, unspecified: Secondary | ICD-10-CM | POA: Diagnosis not present

## 2020-05-22 DIAGNOSIS — M62838 Other muscle spasm: Secondary | ICD-10-CM | POA: Diagnosis not present

## 2020-05-22 DIAGNOSIS — U071 COVID-19: Secondary | ICD-10-CM | POA: Diagnosis not present

## 2020-05-22 DIAGNOSIS — R0981 Nasal congestion: Secondary | ICD-10-CM | POA: Diagnosis not present

## 2020-09-05 DIAGNOSIS — E782 Mixed hyperlipidemia: Secondary | ICD-10-CM | POA: Diagnosis not present

## 2020-09-05 DIAGNOSIS — E559 Vitamin D deficiency, unspecified: Secondary | ICD-10-CM | POA: Diagnosis not present

## 2020-09-13 DIAGNOSIS — N1832 Chronic kidney disease, stage 3b: Secondary | ICD-10-CM | POA: Diagnosis not present

## 2020-09-13 DIAGNOSIS — E782 Mixed hyperlipidemia: Secondary | ICD-10-CM | POA: Diagnosis not present

## 2020-09-13 DIAGNOSIS — R079 Chest pain, unspecified: Secondary | ICD-10-CM | POA: Diagnosis not present

## 2020-09-13 DIAGNOSIS — G894 Chronic pain syndrome: Secondary | ICD-10-CM | POA: Diagnosis not present

## 2020-09-13 DIAGNOSIS — E559 Vitamin D deficiency, unspecified: Secondary | ICD-10-CM | POA: Diagnosis not present

## 2020-11-03 ENCOUNTER — Encounter: Payer: Self-pay | Admitting: Cardiology

## 2020-11-03 ENCOUNTER — Ambulatory Visit (INDEPENDENT_AMBULATORY_CARE_PROVIDER_SITE_OTHER): Payer: Medicare Other | Admitting: Cardiology

## 2020-11-03 ENCOUNTER — Other Ambulatory Visit: Payer: Self-pay

## 2020-11-03 VITALS — BP 140/78 | HR 56 | Ht 71.0 in | Wt 180.0 lb

## 2020-11-03 DIAGNOSIS — F17200 Nicotine dependence, unspecified, uncomplicated: Secondary | ICD-10-CM

## 2020-11-03 DIAGNOSIS — R072 Precordial pain: Secondary | ICD-10-CM | POA: Diagnosis not present

## 2020-11-03 MED ORDER — METOPROLOL TARTRATE 25 MG PO TABS: 25.0000 mg | ORAL_TABLET | Freq: Once | ORAL | 0 refills | Status: DC

## 2020-11-03 NOTE — Patient Instructions (Signed)
Medication Instructions:  Your physician recommends that you continue on your current medications as directed. Please refer to the Current Medication list given to you today.  *If you need a refill on your cardiac medications before your next appointment, please call your pharmacy*   Lab Work:  BMP to be drawn today.  If you have labs (blood work) drawn today and your tests are completely normal, you will receive your results only by: MyChart Message (if you have MyChart) OR A paper copy in the mail If you have any lab test that is abnormal or we need to change your treatment, we will call you to review the results.   Testing/Procedures:   Your physician has requested that you have an echocardiogram. Echocardiography is a painless test that uses sound waves to create images of your heart. It provides your doctor with information about the size and shape of your heart and how well your heart's chambers and valves are working. This procedure takes approximately one hour. There are no restrictions for this procedure.   2.    Your physician has requested that you have a coronary cardiac CT. Cardiac computed tomography (CT) is a painless test that uses an x-ray machine to take clear, detailed pictures of your heart.     Your cardiac CT will be scheduled at:   Thursday 12/10/20  at 2:45 (arrive at 2:30)   [facility] [address] [place], [place] [address] [phone]  Please arrive 15 mins early for check-in and test prep.   Please follow these instructions carefully (unless otherwise directed):   Hold all erectile dysfunction medications at least 3 days (72 hrs) prior to test.   On the Night Before the Test: Be sure to Drink plenty of water. Do not consume any caffeinated/decaffeinated beverages or chocolate 12 hours prior to your test.    On the Day of the Test: Drink plenty of water until 1 hour prior to the test. Do not  eat any food 4 hours prior to the test. You may take your regular medications prior to the test.  Take metoprolol (Lopressor) two hours prior to test. (This was sent to your pharmacy)        After the Test: Drink plenty of water. After receiving IV contrast, you may experience a mild flushed feeling. This is normal. On occasion, you may experience a mild rash up to 24 hours after the test. This is not dangerous. If this occurs, you can take Benadryl 25 mg and increase your fluid intake. If you experience trouble breathing, this can be serious. If it is severe call 911 IMMEDIATELY. If it is mild, please call our office. If you take any of these medications: Glipizide/Metformin, Avandament, Glucavance, please do not take 48 hours after completing test unless otherwise instructed.  Please allow 2-4 weeks for scheduling of routine cardiac CTs. Some insurance companies require a pre-authorization which may delay scheduling of this test.   For non-scheduling related questions, please contact the cardiac imaging nurse navigator should you have any questions/concerns: Rockwell Alexandria, Cardiac Imaging Nurse Navigator Larey Brick, Cardiac Imaging Nurse Navigator [facility] Direct Office Dial: [phone]   For scheduling needs, including cancellations and rescheduling, please call [place], [phone].     Follow-Up: At [facility], you and your health needs are our priority.  As part of our continuing mission to provide you with exceptional heart care, we have created designated Provider Care Teams.  These Care Teams include your primary Cardiologist (physician) and Advanced Practice Providers (APPs -  Physician Assistants and Nurse Practitioners) who all work together to provide you with the care you need, when you need it.  We recommend signing up for the patient portal called "MyChart".  Sign up information is provided on this After Visit Summary.  MyChart is  used to connect with patients for Virtual Visits (Telemedicine).  Patients are able to view lab/test results, encounter notes, upcoming appointments, etc.  Non-urgent messages can be sent to your provider as well.   To learn more about what you can do with MyChart, go to [email or web address].    Your next appointment:   6-8 weeks   The format for your next appointment:   In Person  Provider:   Debbe Odea, MD   Other Instructions

## 2020-11-03 NOTE — Progress Notes (Signed)
Cardiology Office Note:    Date:  11/03/2020   ID:  Jared Li, DOB October 20, 1965, MRN [id number]  PCP:  Celene Squibb, MD   [facility] Providers Cardiologist:  None     Referring MD: Celene Squibb, MD   Chief Complaint  Patient presents with   New Patient (Initial Visit)    Referred by PCP for chest pain. Meds reviewed verbally with patient.     History of Present Illness:    Jared Li is a 55 y.o. male with a hx of GERD, cauda equina syndrome, chronic pain, current smoker x40+ years who presents due to chest pressure.  Patient was at home with some friends when he noticed left-sided chest pressure, waxing and waning for over a week.  He does not ambulate much because he gets wobbly and occasionally falls due to leg weakness.  Saw primary care provider who wanted him to get cardiac evaluation.  Denies any personal history of heart disease.  States his son passed from RV dysplasia.  Patient received work-up/genetic testing years ago in [place] which did not reveal any abnormalities.  Past Medical History:  Diagnosis Date   Adjustment disorder    Arthritis    Asthma    Cauda equina syndrome (HCC)    Cellulitis of lower limb    Cervical radiculopathy    Chronic intractable headache    Mirgraines- lst one 06/02/16   Chronic pain    Edema    GERD (gastroesophageal reflux disease)    History of kidney stones    passed   Insomnia    Loose stools    Lumbar post-laminectomy syndrome    MRSA bacteremia    Open wound of lumbar region with complication    Physical deconditioning    Tobacco use disorder     Past Surgical History:  Procedure Laterality Date   APPLICATION OF A-CELL OF HEAD/NECK N/A 06/13/2016   Procedure: neck closure AFTER SPINE SURGERY WITH placement of wound vac.;  Surgeon: Loel Lofty Dillingham, DO;  Location: [facility];  Service: Plastics;  Laterality: N/A;   CARPAL TUNNEL RELEASE Bilateral    CERVICAL FUSION     Numerous   LUMBAR FUSION      Numerous   Pain Pump insertion     x 2   POSTERIOR CERVICAL FUSION/FORAMINOTOMY N/A 06/13/2016   Procedure: POSTERIOR CERVICAL DECOMPRESSION FUSION, CERVICAL 3-4, CERVICAL 4-5, CERVICAL 5-6, CERVICAL 6-7 WITH INSTRUMENTATION AND ALLOGRAFT;  Surgeon: Phylliss Bob, MD;  Location: [facility];  Service: Orthopedics;  Laterality: N/A;  POSTERIOR CERVICAL DECOMPRESSION FUSION, CERVICAL 3-4, CERVICAL 4-5, CERVICAL 5-6, CERVICAL 6-7 WITH INSTRUMENTATION AND ALLOGRAFT   POSTERIOR LAMINECTOMY / DECOMPRESSION LUMBAR SPINE     removal of pain pump     x 2   Spinal Card Stimulator Removal      x 2   SPINAL CORD STIMULATOR INSERTION      x2    Current Medications: Current Meds  Medication Sig   albuterol (PROVENTIL HFA;VENTOLIN HFA) 108 (90 Base) MCG/ACT inhaler Inhale 2 puffs into the lungs every 6 (six) hours as needed for wheezing or shortness of breath.   cloNIDine (CATAPRES) 0.1 MG tablet Take 0.1 mg by mouth 2 (two) times daily.   diphenhydrAMINE (BENADRYL) 25 MG tablet Take 25 mg by mouth every 6 (six) hours as needed for itching.    dronabinol (MARINOL) 5 MG capsule Take one capsule by mouth twice daily before meals   GRALISE 600 MG TABS Take 3,000 mg  by mouth daily with supper. Takes 5 tablets at dinner   HYDROmorphone HCl 16 MG T24A Take 16 mg by mouth 2 (two) times daily.   ibuprofen (ADVIL) 800 MG tablet Take 1 tablet (800 mg total) by mouth 3 (three) times daily.   imipramine (TOFRANIL) 10 MG tablet Take 10 mg by mouth at bedtime.   LORazepam (ATIVAN) 0.5 MG tablet Take 0.5 mg by mouth 2 (two) times daily as needed.   metoprolol tartrate (LOPRESSOR) 25 MG tablet Take 1 tablet (25 mg total) by mouth once for 1 dose. Take 2 hours prior to your CT scan.   Multiple Vitamin (MULTIVITAMIN WITH MINERALS) TABS tablet Take 1 tablet by mouth daily.   Nutritional Supplements (ENSURE HIGH PROTEIN PO) Take 1 Can by mouth 4 (four) times daily.   omeprazole (PRILOSEC OTC) 20 MG tablet Take 20 mg by mouth  daily as needed.    ondansetron (ZOFRAN) 4 MG tablet Take 4 mg by mouth every 8 (eight) hours as needed for nausea or vomiting.   oxyCODONE (ROXICODONE) 15 MG immediate release tablet Take 15 mg by mouth every 4 (four) hours as needed for pain.   rizatriptan (MAXALT) 10 MG tablet Take 10 mg by mouth every 2 (two) hours as needed for migraine. May repeat in 2 hours if needed    sertraline (ZOLOFT) 100 MG tablet Take 100 mg by mouth at bedtime. Takes 2 tablets   Simethicone (GAS-X PO) Take 2 tablets by mouth 2 (two) times daily as needed (gas).   vitamin C (ASCORBIC ACID) 500 MG tablet Take 500 mg by mouth 2 (two) times daily.   zinc sulfate 220 (50 Zn) MG capsule Take 220 mg by mouth daily.     Allergies:   Adhesive [tape], Codeine, and Morphine and related   Social History   Socioeconomic History   Marital status: Divorced    Spouse name: Not on file   Number of children: Not on file   Years of education: Not on file   Highest education level: Not on file  Occupational History   Not on file  Tobacco Use   Smoking status: Former    Years: 35.00    Types: Cigarettes    Quit date: 06/02/2016    Years since quitting: 4.4   Smokeless tobacco: Former   Tobacco comments:    used chewing tabacco as a child  Substance and Sexual Activity   Alcohol use: No    Alcohol/week: 0.0 standard drinks    Comment: occasional   Drug use: No   Sexual activity: Not on file  Other Topics Concern   Not on file  Social History Narrative   Not on file   Social Determinants of Health   Financial Resource Strain: Not on file  Food Insecurity: Not on file  Transportation Needs: Not on file  Physical Activity: Not on file  Stress: Not on file  Social Connections: Not on file     Family History: The patient's Family history is unknown by patient.  ROS:   Please see the history of present illness.     All other systems reviewed and are negative.  EKGs/Labs/Other Studies Reviewed:    The  following studies were reviewed today:   EKG:  EKG is  ordered today.  The ekg ordered today demonstrates sinus bradycardia, low voltage   Recent Labs: No results found for requested labs within last 8760 hours.  Recent Lipid Panel No results found for: CHOL, TRIG, HDL, CHOLHDL,  VLDL, LDLCALC, LDLDIRECT   Risk Assessment/Calculations:          Physical Exam:    VS:  BP 140/78 (BP Location: Left Arm, Patient Position: Sitting, Cuff Size: Normal)   Pulse (!) 56   Ht '5\' 11"'  (1.803 m)   Wt 180 lb (81.6 kg)   SpO2 96%   BMI 25.10 kg/m     Wt Readings from Last 3 Encounters:  11/03/20 180 lb (81.6 kg)  01/13/20 165 lb (74.8 kg)  06/13/16 221 lb 6.4 oz (100.4 kg)     GEN:  Well nourished, well developed in no acute distress HEENT: Normal NECK: No JVD; No carotid bruits LYMPHATICS: No lymphadenopathy CARDIAC: RRR, no murmurs, rubs, gallops RESPIRATORY:  Clear to auscultation without rales, wheezing or rhonchi  ABDOMEN: Soft, non-tender, non-distended MUSCULOSKELETAL:  No edema; No deformity  SKIN: Warm and dry NEUROLOGIC:  Alert and oriented x 3 PSYCHIATRIC:  Normal affect   ASSESSMENT:    1. Precordial pain   2. Smoking    PLAN:    In order of problems listed above:  Chest pain, risk factors current smoker.  Get echo to evaluate systolic and diastolic function, get coronary CTA to evaluate presence of CAD. Current smoker, cessation advised, over 5 minutes spent counseling patient.  Follow-up after testing   Medication Adjustments/Labs and Tests Ordered: Current medicines are reviewed at length with the patient today.  Concerns regarding medicines are outlined above.  Orders Placed This Encounter  Procedures   CT CORONARY MORPH W/CTA COR W/SCORE W/CA W/CM &/OR WO/CM   Basic metabolic panel   EKG [id number]   ECHOCARDIOGRAM COMPLETE    Meds ordered this encounter  Medications   metoprolol tartrate (LOPRESSOR) 25 MG tablet    Sig: Take 1 tablet (25 mg total)  by mouth once for 1 dose. Take 2 hours prior to your CT scan.    Dispense:  1 tablet    Refill:  0     Patient Instructions  Medication Instructions:  Your physician recommends that you continue on your current medications as directed. Please refer to the Current Medication list given to you today.  *If you need a refill on your cardiac medications before your next appointment, please call your pharmacy*   Lab Work:  BMP to be drawn today.  If you have labs (blood work) drawn today and your tests are completely normal, you will receive your results only by: [place] (if you have MyChart) OR A paper copy in the mail If you have any lab test that is abnormal or we need to change your treatment, we will call you to review the results.   Testing/Procedures:   Your physician has requested that you have an echocardiogram. Echocardiography is a painless test that uses sound waves to create images of your heart. It provides your doctor with information about the size and shape of your heart and how well your heart's chambers and valves are working. This procedure takes approximately one hour. There are no restrictions for this procedure.   2.    Your physician has requested that you have a coronary cardiac CT. Cardiac computed tomography (CT) is a painless test that uses an x-ray machine to take clear, detailed pictures of your heart.     Your cardiac CT will be scheduled at:   Thursday 12/10/20  at 2:45 (arrive at 2:30)   [facility] [address] [place], [place] [address] [phone]  Please  arrive 15 mins early for check-in and test prep.   Please follow these instructions carefully (unless otherwise directed):   Hold all erectile dysfunction medications at least 3 days (72 hrs) prior to test.   On the Night Before the Test: Be sure to Drink plenty of water. Do not consume any caffeinated/decaffeinated beverages or  chocolate 12 hours prior to your test.    On the Day of the Test: Drink plenty of water until 1 hour prior to the test. Do not eat any food 4 hours prior to the test. You may take your regular medications prior to the test.  Take metoprolol (Lopressor) two hours prior to test. (This was sent to your pharmacy)        After the Test: Drink plenty of water. After receiving IV contrast, you may experience a mild flushed feeling. This is normal. On occasion, you may experience a mild rash up to 24 hours after the test. This is not dangerous. If this occurs, you can take Benadryl 25 mg and increase your fluid intake. If you experience trouble breathing, this can be serious. If it is severe call 911 IMMEDIATELY. If it is mild, please call our office. If you take any of these medications: Glipizide/Metformin, Avandament, Glucavance, please do not take 48 hours after completing test unless otherwise instructed.  Please allow 2-4 weeks for scheduling of routine cardiac CTs. Some insurance companies require a pre-authorization which may delay scheduling of this test.   For non-scheduling related questions, please contact the cardiac imaging nurse navigator should you have any questions/concerns: Marchia Bond, Cardiac Imaging Nurse Navigator Gordy Clement, Cardiac Imaging Nurse Navigator [facility] Direct Office Dial: [phone]   For scheduling needs, including cancellations and rescheduling, please call [place], [phone].     Follow-Up: At [facility], you and your health needs are our priority.  As part of our continuing mission to provide you with exceptional heart care, we have created designated Provider Care Teams.  These Care Teams include your primary Cardiologist (physician) and Advanced Practice Providers (APPs -  Physician Assistants and Nurse Practitioners) who all work together to provide you with the care you need, when you need it.  We  recommend signing up for the patient portal called "MyChart".  Sign up information is provided on this After Visit Summary.  MyChart is used to connect with patients for Virtual Visits (Telemedicine).  Patients are able to view lab/test results, encounter notes, upcoming appointments, etc.  Non-urgent messages can be sent to your provider as well.   To learn more about what you can do with MyChart, go to [email or web address].    Your next appointment:   6-8 weeks   The format for your next appointment:   In Person  Provider:   Kate Sable, MD   Other Instructions    Signed, Kate Sable, MD  11/03/2020 12:35 PM    [place]

## 2020-11-04 LAB — BASIC METABOLIC PANEL
BUN/Creatinine Ratio: 7 — ABNORMAL LOW (ref 9–20)
BUN: 8 mg/dL (ref 6–24)
CO2: 28 mmol/L (ref 20–29)
Calcium: 9.2 mg/dL (ref 8.7–10.2)
Chloride: 96 mmol/L (ref 96–106)
Creatinine, Ser: 1.12 mg/dL (ref 0.76–1.27)
Glucose: 75 mg/dL (ref 65–99)
Potassium: 4.7 mmol/L (ref 3.5–5.2)
Sodium: 138 mmol/L (ref 134–144)
eGFR: 78 mL/min/{1.73_m2} (ref >59)

## 2020-11-07 ENCOUNTER — Telehealth (HOSPITAL_COMMUNITY): Payer: Self-pay | Admitting: *Deleted

## 2020-11-07 NOTE — Telephone Encounter (Signed)
Attempted to call patient regarding upcoming cardiac CT appointment. Voicemail box full and unable to leave a message.  Bona Hubbard RN Navigator Cardiac Imaging [facility] [phone] Office [phone] Cell  

## 2020-11-08 ENCOUNTER — Telehealth (HOSPITAL_COMMUNITY): Payer: Self-pay | Admitting: *Deleted

## 2020-11-08 ENCOUNTER — Other Ambulatory Visit (HOSPITAL_COMMUNITY): Payer: Self-pay | Admitting: *Deleted

## 2020-11-08 MED ORDER — METOPROLOL TARTRATE 25 MG PO TABS: 25.0000 mg | ORAL_TABLET | Freq: Once | ORAL | 0 refills | Status: DC

## 2020-11-08 NOTE — Telephone Encounter (Signed)
Patient returning call regarding upcoming cardiac imaging study; pt verbalizes understanding of appt date/time, parking situation and where to check in, pre-test NPO status and medications ordered, and verified current allergies; name and call back number provided for further questions should they arise  Larey Brick RN Navigator Cardiac Imaging [facility] [phone] office [phone] cell  Patient reports he does not drive and will be taking his daily pain and anxiety medications for test.  Patient to take 25mg  metoprolol tartrate two hours prior to cardiac CT.

## 2020-11-09 ENCOUNTER — Other Ambulatory Visit: Payer: Self-pay

## 2020-11-09 ENCOUNTER — Ambulatory Visit
Admission: RE | Admit: 2020-11-09 | Discharge: 2020-11-09 | Disposition: A | Discharge status: Home / Self Care | Payer: Medicare Other | Source: Ambulatory Visit | Attending: Cardiology | Admitting: Cardiology

## 2020-11-09 DIAGNOSIS — R072 Precordial pain: Secondary | ICD-10-CM | POA: Diagnosis not present

## 2020-11-09 MED ORDER — METOPROLOL TARTRATE 5 MG/5ML IV SOLN
5.0000 mg | Freq: Once | INTRAVENOUS | Status: AC
  Administered 2020-11-09: 5 mg via INTRAVENOUS

## 2020-11-09 MED ORDER — NITROGLYCERIN 0.4 MG SL SUBL
0.8000 mg | SUBLINGUAL_TABLET | Freq: Once | SUBLINGUAL | Status: AC
  Administered 2020-11-09: 0.8 mg via SUBLINGUAL

## 2020-11-09 MED ORDER — IOHEXOL 350 MG/ML SOLN
75.0000 mL | Freq: Once | INTRAVENOUS | Status: AC | PRN
  Administered 2020-11-09: 75 mL via INTRAVENOUS

## 2020-11-09 NOTE — Progress Notes (Signed)
Patient tolerated CT well. Drank water after. Vital signs stable encourage to drink water throughout day.Reasons explained and verbalized understanding.  

## 2020-11-30 ENCOUNTER — Other Ambulatory Visit: Payer: Medicare Other

## 2020-12-05 ENCOUNTER — Emergency Department (HOSPITAL_COMMUNITY): Payer: Medicare Other

## 2020-12-05 ENCOUNTER — Encounter (HOSPITAL_COMMUNITY): Payer: Self-pay

## 2020-12-05 ENCOUNTER — Inpatient Hospital Stay (HOSPITAL_COMMUNITY)
Admission: EM | Admit: 2020-12-05 | Discharge: 2020-12-12 | DRG: 871 | Disposition: A | Discharge status: Home Health | Payer: Medicare Other | Attending: Internal Medicine | Admitting: Internal Medicine

## 2020-12-05 ENCOUNTER — Other Ambulatory Visit: Payer: Self-pay

## 2020-12-05 DIAGNOSIS — Z791 Long term (current) use of non-steroidal anti-inflammatories (NSAID): Secondary | ICD-10-CM

## 2020-12-05 DIAGNOSIS — R0689 Other abnormalities of breathing: Secondary | ICD-10-CM | POA: Diagnosis not present

## 2020-12-05 DIAGNOSIS — M1990 Unspecified osteoarthritis, unspecified site: Secondary | ICD-10-CM | POA: Diagnosis not present

## 2020-12-05 DIAGNOSIS — N10 Acute tubulo-interstitial nephritis: Secondary | ICD-10-CM | POA: Diagnosis not present

## 2020-12-05 DIAGNOSIS — G9340 Encephalopathy, unspecified: Secondary | ICD-10-CM | POA: Diagnosis not present

## 2020-12-05 DIAGNOSIS — Z8614 Personal history of Methicillin resistant Staphylococcus aureus infection: Secondary | ICD-10-CM | POA: Diagnosis not present

## 2020-12-05 DIAGNOSIS — Z20822 Contact with and (suspected) exposure to covid-19: Secondary | ICD-10-CM | POA: Diagnosis present

## 2020-12-05 DIAGNOSIS — R4182 Altered mental status, unspecified: Secondary | ICD-10-CM | POA: Diagnosis present

## 2020-12-05 DIAGNOSIS — F1123 Opioid dependence with withdrawal: Secondary | ICD-10-CM | POA: Diagnosis present

## 2020-12-05 DIAGNOSIS — W19XXXA Unspecified fall, initial encounter: Secondary | ICD-10-CM | POA: Diagnosis not present

## 2020-12-05 DIAGNOSIS — K219 Gastro-esophageal reflux disease without esophagitis: Secondary | ICD-10-CM | POA: Diagnosis present

## 2020-12-05 DIAGNOSIS — A419 Sepsis, unspecified organism: Principal | ICD-10-CM | POA: Diagnosis present

## 2020-12-05 DIAGNOSIS — J45909 Unspecified asthma, uncomplicated: Secondary | ICD-10-CM | POA: Diagnosis present

## 2020-12-05 DIAGNOSIS — G9389 Other specified disorders of brain: Secondary | ICD-10-CM | POA: Diagnosis not present

## 2020-12-05 DIAGNOSIS — M961 Postlaminectomy syndrome, not elsewhere classified: Secondary | ICD-10-CM | POA: Diagnosis not present

## 2020-12-05 DIAGNOSIS — M6282 Rhabdomyolysis: Secondary | ICD-10-CM | POA: Diagnosis present

## 2020-12-05 DIAGNOSIS — F4320 Adjustment disorder, unspecified: Secondary | ICD-10-CM | POA: Diagnosis present

## 2020-12-05 DIAGNOSIS — R6520 Severe sepsis without septic shock: Secondary | ICD-10-CM | POA: Diagnosis not present

## 2020-12-05 DIAGNOSIS — E872 Acidosis: Secondary | ICD-10-CM | POA: Diagnosis present

## 2020-12-05 DIAGNOSIS — Z87891 Personal history of nicotine dependence: Secondary | ICD-10-CM | POA: Diagnosis not present

## 2020-12-05 DIAGNOSIS — Z79899 Other long term (current) drug therapy: Secondary | ICD-10-CM

## 2020-12-05 DIAGNOSIS — G9341 Metabolic encephalopathy: Secondary | ICD-10-CM | POA: Diagnosis present

## 2020-12-05 DIAGNOSIS — R748 Abnormal levels of other serum enzymes: Secondary | ICD-10-CM | POA: Diagnosis not present

## 2020-12-05 DIAGNOSIS — Z9109 Other allergy status, other than to drugs and biological substances: Secondary | ICD-10-CM

## 2020-12-05 DIAGNOSIS — Y92230 Patient room in hospital as the place of occurrence of the external cause: Secondary | ICD-10-CM | POA: Diagnosis not present

## 2020-12-05 DIAGNOSIS — Z885 Allergy status to narcotic agent status: Secondary | ICD-10-CM | POA: Diagnosis not present

## 2020-12-05 DIAGNOSIS — R000 Tachycardia, unspecified: Secondary | ICD-10-CM | POA: Diagnosis not present

## 2020-12-05 DIAGNOSIS — R52 Pain, unspecified: Secondary | ICD-10-CM | POA: Diagnosis not present

## 2020-12-05 DIAGNOSIS — R410 Disorientation, unspecified: Secondary | ICD-10-CM | POA: Diagnosis not present

## 2020-12-05 DIAGNOSIS — I10 Essential (primary) hypertension: Secondary | ICD-10-CM | POA: Diagnosis not present

## 2020-12-05 DIAGNOSIS — E876 Hypokalemia: Secondary | ICD-10-CM | POA: Diagnosis not present

## 2020-12-05 DIAGNOSIS — Z87442 Personal history of urinary calculi: Secondary | ICD-10-CM | POA: Diagnosis not present

## 2020-12-05 DIAGNOSIS — R404 Transient alteration of awareness: Secondary | ICD-10-CM | POA: Diagnosis not present

## 2020-12-05 DIAGNOSIS — G4700 Insomnia, unspecified: Secondary | ICD-10-CM | POA: Diagnosis not present

## 2020-12-05 DIAGNOSIS — R109 Unspecified abdominal pain: Secondary | ICD-10-CM | POA: Diagnosis not present

## 2020-12-05 DIAGNOSIS — G8929 Other chronic pain: Secondary | ICD-10-CM | POA: Diagnosis present

## 2020-12-05 DIAGNOSIS — F11221 Opioid dependence with intoxication delirium: Secondary | ICD-10-CM | POA: Diagnosis not present

## 2020-12-05 DIAGNOSIS — R4020 Unspecified coma: Secondary | ICD-10-CM | POA: Diagnosis not present

## 2020-12-05 DIAGNOSIS — R509 Fever, unspecified: Secondary | ICD-10-CM

## 2020-12-05 DIAGNOSIS — F11288 Opioid dependence with other opioid-induced disorder: Secondary | ICD-10-CM | POA: Diagnosis not present

## 2020-12-05 DIAGNOSIS — F1120 Opioid dependence, uncomplicated: Secondary | ICD-10-CM | POA: Diagnosis present

## 2020-12-05 LAB — COMPREHENSIVE METABOLIC PANEL
ALT: 17 U/L (ref 0–44)
AST: 32 U/L (ref 15–41)
Albumin: 4.9 g/dL (ref 3.5–5.0)
Alkaline Phosphatase: 178 U/L — ABNORMAL HIGH (ref 38–126)
Anion gap: 13 (ref 5–15)
BUN: 10 mg/dL (ref 6–20)
CO2: 22 mmol/L (ref 22–32)
Calcium: 9.3 mg/dL (ref 8.9–10.3)
Chloride: 101 mmol/L (ref 98–111)
Creatinine, Ser: 1.21 mg/dL (ref 0.61–1.24)
GFR, Estimated: >60 mL/min (ref >60)
Glucose, Bld: 128 mg/dL — ABNORMAL HIGH (ref 70–99)
Potassium: 3.2 mmol/L — ABNORMAL LOW (ref 3.5–5.1)
Sodium: 136 mmol/L (ref 135–145)
Total Bilirubin: 0.8 mg/dL (ref 0.3–1.2)
Total Protein: 9.1 g/dL — ABNORMAL HIGH (ref 6.5–8.1)

## 2020-12-05 LAB — RAPID URINE DRUG SCREEN, HOSP PERFORMED
Amphetamines: NOT DETECTED
Barbiturates: NOT DETECTED
Benzodiazepines: NOT DETECTED
Cocaine: NOT DETECTED
Opiates: POSITIVE — AB
Tetrahydrocannabinol: POSITIVE — AB

## 2020-12-05 LAB — URINALYSIS, ROUTINE W REFLEX MICROSCOPIC
Bilirubin Urine: NEGATIVE
Glucose, UA: 100 mg/dL — AB
Ketones, ur: NEGATIVE mg/dL
Leukocytes,Ua: NEGATIVE
Nitrite: NEGATIVE
Protein, ur: 100 mg/dL — AB
Specific Gravity, Urine: >1.03 — ABNORMAL HIGH (ref 1.005–1.030)
pH: 6 (ref 5.0–8.0)

## 2020-12-05 LAB — CBC WITH DIFFERENTIAL/PLATELET
Abs Immature Granulocytes: 0.19 10*3/uL — ABNORMAL HIGH (ref 0.00–0.07)
Basophils Absolute: 0.1 10*3/uL (ref 0.0–0.1)
Basophils Relative: 1 %
Eosinophils Absolute: 0 10*3/uL (ref 0.0–0.5)
Eosinophils Relative: 0 %
HCT: 55 % — ABNORMAL HIGH (ref 39.0–52.0)
Hemoglobin: 19.1 g/dL — ABNORMAL HIGH (ref 13.0–17.0)
Immature Granulocytes: 1 %
Lymphocytes Relative: 7 %
Lymphs Abs: 1.8 10*3/uL (ref 0.7–4.0)
MCH: 33.5 pg (ref 26.0–34.0)
MCHC: 34.7 g/dL (ref 30.0–36.0)
MCV: 96.3 fL (ref 80.0–100.0)
Monocytes Absolute: 2.6 10*3/uL — ABNORMAL HIGH (ref 0.1–1.0)
Monocytes Relative: 10 %
Neutro Abs: 20.8 10*3/uL — ABNORMAL HIGH (ref 1.7–7.7)
Neutrophils Relative %: 81 %
Platelets: 256 10*3/uL (ref 150–400)
RBC: 5.71 MIL/uL (ref 4.22–5.81)
RDW: 13.6 % (ref 11.5–15.5)
WBC: 25.5 10*3/uL — ABNORMAL HIGH (ref 4.0–10.5)
nRBC: 0 % (ref 0.0–0.2)

## 2020-12-05 LAB — URINALYSIS, MICROSCOPIC (REFLEX)

## 2020-12-05 LAB — CK: Total CK: 625 U/L — ABNORMAL HIGH (ref 49–397)

## 2020-12-05 LAB — ETHANOL: Alcohol, Ethyl (B): <10 mg/dL (ref <10)

## 2020-12-05 LAB — RESP PANEL BY RT-PCR (FLU A&B, COVID) ARPGX2
Influenza A by PCR: NEGATIVE
Influenza B by PCR: NEGATIVE
SARS Coronavirus 2 by RT PCR: NEGATIVE

## 2020-12-05 LAB — LACTIC ACID, PLASMA
Lactic Acid, Venous: 2.3 mmol/L (ref 0.5–1.9)
Lactic Acid, Venous: 2 mmol/L (ref 0.5–1.9)

## 2020-12-05 MED ORDER — LACTATED RINGERS IV SOLN: INTRAVENOUS | Status: DC

## 2020-12-05 MED ORDER — VANCOMYCIN HCL 2000 MG/400ML IV SOLN
2000.0000 mg | Freq: Once | INTRAVENOUS | Status: AC
  Administered 2020-12-05: 2000 mg via INTRAVENOUS
  Filled 2020-12-05: qty 400

## 2020-12-05 MED ORDER — LORAZEPAM 2 MG/ML IJ SOLN
1.0000 mg | Freq: Once | INTRAMUSCULAR | Status: AC
  Administered 2020-12-05: 1 mg via INTRAVENOUS
  Filled 2020-12-05: qty 1

## 2020-12-05 MED ORDER — LACTATED RINGERS IV BOLUS (SEPSIS)
1000.0000 mL | Freq: Once | INTRAVENOUS | Status: AC
  Administered 2020-12-05: 1000 mL via INTRAVENOUS

## 2020-12-05 MED ORDER — SODIUM CHLORIDE 0.9 % IV SOLN
2.0000 g | Freq: Three times a day (TID) | INTRAVENOUS | Status: DC
  Administered 2020-12-06 – 2020-12-07 (×4): 2 g via INTRAVENOUS
  Filled 2020-12-05 (×4): qty 2

## 2020-12-05 MED ORDER — VANCOMYCIN HCL 10 G IV SOLR
2250.0000 mg | INTRAVENOUS | Status: DC
  Filled 2020-12-05: qty 22.5

## 2020-12-05 MED ORDER — VANCOMYCIN HCL IN DEXTROSE 1-5 GM/200ML-% IV SOLN
1000.0000 mg | Freq: Once | INTRAVENOUS | Status: DC
  Filled 2020-12-05: qty 200

## 2020-12-05 MED ORDER — SODIUM CHLORIDE 0.9 % IV SOLN
2.0000 g | Freq: Once | INTRAVENOUS | Status: AC
  Administered 2020-12-05: 2 g via INTRAVENOUS
  Filled 2020-12-05: qty 2

## 2020-12-05 MED ORDER — IOHEXOL 350 MG/ML SOLN
80.0000 mL | Freq: Once | INTRAVENOUS | Status: AC | PRN
  Administered 2020-12-05: 80 mL via INTRAVENOUS

## 2020-12-05 MED ORDER — METRONIDAZOLE 500 MG/100ML IV SOLN
500.0000 mg | Freq: Once | INTRAVENOUS | Status: AC
  Administered 2020-12-05: 500 mg via INTRAVENOUS
  Filled 2020-12-05: qty 100

## 2020-12-05 NOTE — ED Notes (Signed)
Patient transported to CT 
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
## 2020-12-05 NOTE — ED Notes (Signed)
Pt was agitated attempting to exit stretcher. Several attempts to redirect by staff and family at bedside were unsuccessful. Pt given 1 mg ativan
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
## 2020-12-05 NOTE — Progress Notes (Signed)
Pharmacy Antibiotic Note  Jared Li is a 55 y.o. male admitted on 12/05/2020 with .  Pharmacy has been consulted for cefepime and vancomycin dosing for infection of unknown source.  WBC 25.5, 101 F (rectal); Scr 1.21, CrCl ~80 ml/min  Plan: Cefepime 2 gm IV Q 8 hrs Vancomycin 2 gm IV X 1, followed by vancomycin 2250 mg IV Q 24 hrs (estimated vancomycin AUC is 516, goal AUC is 400-550) Monitor WBC, temp, clinical course, cultures, renal function, vancomycin levels as indicated  Height: 5\' 10"  (177.8 cm) Weight: 95.3 kg (210 lb) IBW/kg (Calculated) : 73  Temp (24hrs), Avg:99.6 F (37.6 C), Min:98.1 F (36.7 C), Max:101 F (38.3 C)  Recent Labs  Lab 12/05/20 1748  WBC 25.5*  CREATININE 1.21    Estimated Creatinine Clearance: 79.9 mL/min (by C-G formula based on SCr of 1.21 mg/dL).    Allergies  Allergen Reactions   Adhesive [Tape] Other (See Comments)    Skin blisters   Codeine Nausea And Vomiting   Morphine And Related     Itching     Antimicrobials this admission: Cefepime 9/20 >> Vancomycin 9/20 >> Metronidazole X 1 9/20 >>  Microbiology results: 9/20 Respiratory panel:  pending  9/20 Bld cx X 2:  pending  Thank you for allowing pharmacy to be a part of this patient's care.  10/20, PharmD, BCPS, [facility] Clinical Pharmacist 12/05/2020 8:28 PM

## 2020-12-06 ENCOUNTER — Inpatient Hospital Stay (HOSPITAL_COMMUNITY)
Admit: 2020-12-06 | Discharge: 2020-12-06 | Disposition: A | Discharge status: Home / Self Care | Payer: Medicare Other | Attending: Internal Medicine | Admitting: Internal Medicine

## 2020-12-06 DIAGNOSIS — G4700 Insomnia, unspecified: Secondary | ICD-10-CM | POA: Diagnosis present

## 2020-12-06 DIAGNOSIS — G8929 Other chronic pain: Secondary | ICD-10-CM | POA: Diagnosis present

## 2020-12-06 DIAGNOSIS — M1990 Unspecified osteoarthritis, unspecified site: Secondary | ICD-10-CM | POA: Diagnosis present

## 2020-12-06 DIAGNOSIS — E876 Hypokalemia: Secondary | ICD-10-CM | POA: Diagnosis not present

## 2020-12-06 DIAGNOSIS — F11288 Opioid dependence with other opioid-induced disorder: Secondary | ICD-10-CM | POA: Diagnosis not present

## 2020-12-06 DIAGNOSIS — A419 Sepsis, unspecified organism: Secondary | ICD-10-CM | POA: Diagnosis present

## 2020-12-06 DIAGNOSIS — G9340 Encephalopathy, unspecified: Secondary | ICD-10-CM | POA: Diagnosis not present

## 2020-12-06 DIAGNOSIS — R6520 Severe sepsis without septic shock: Secondary | ICD-10-CM

## 2020-12-06 DIAGNOSIS — Y92230 Patient room in hospital as the place of occurrence of the external cause: Secondary | ICD-10-CM | POA: Diagnosis not present

## 2020-12-06 DIAGNOSIS — M6282 Rhabdomyolysis: Secondary | ICD-10-CM | POA: Diagnosis not present

## 2020-12-06 DIAGNOSIS — Z885 Allergy status to narcotic agent status: Secondary | ICD-10-CM | POA: Diagnosis not present

## 2020-12-06 DIAGNOSIS — F11221 Opioid dependence with intoxication delirium: Secondary | ICD-10-CM | POA: Diagnosis not present

## 2020-12-06 DIAGNOSIS — Z791 Long term (current) use of non-steroidal anti-inflammatories (NSAID): Secondary | ICD-10-CM | POA: Diagnosis not present

## 2020-12-06 DIAGNOSIS — Z87891 Personal history of nicotine dependence: Secondary | ICD-10-CM | POA: Diagnosis not present

## 2020-12-06 DIAGNOSIS — Z87442 Personal history of urinary calculi: Secondary | ICD-10-CM | POA: Diagnosis not present

## 2020-12-06 DIAGNOSIS — I10 Essential (primary) hypertension: Secondary | ICD-10-CM | POA: Diagnosis present

## 2020-12-06 DIAGNOSIS — R748 Abnormal levels of other serum enzymes: Secondary | ICD-10-CM | POA: Diagnosis present

## 2020-12-06 DIAGNOSIS — F4320 Adjustment disorder, unspecified: Secondary | ICD-10-CM | POA: Diagnosis present

## 2020-12-06 DIAGNOSIS — R4182 Altered mental status, unspecified: Secondary | ICD-10-CM | POA: Diagnosis present

## 2020-12-06 DIAGNOSIS — Z20822 Contact with and (suspected) exposure to covid-19: Secondary | ICD-10-CM | POA: Diagnosis present

## 2020-12-06 DIAGNOSIS — N10 Acute tubulo-interstitial nephritis: Secondary | ICD-10-CM | POA: Diagnosis present

## 2020-12-06 DIAGNOSIS — F1120 Opioid dependence, uncomplicated: Secondary | ICD-10-CM | POA: Diagnosis present

## 2020-12-06 DIAGNOSIS — G9341 Metabolic encephalopathy: Secondary | ICD-10-CM

## 2020-12-06 DIAGNOSIS — E872 Acidosis: Secondary | ICD-10-CM | POA: Diagnosis present

## 2020-12-06 DIAGNOSIS — W19XXXA Unspecified fall, initial encounter: Secondary | ICD-10-CM | POA: Diagnosis not present

## 2020-12-06 DIAGNOSIS — F1123 Opioid dependence with withdrawal: Secondary | ICD-10-CM | POA: Diagnosis present

## 2020-12-06 DIAGNOSIS — J45909 Unspecified asthma, uncomplicated: Secondary | ICD-10-CM | POA: Diagnosis present

## 2020-12-06 DIAGNOSIS — G9389 Other specified disorders of brain: Secondary | ICD-10-CM | POA: Diagnosis present

## 2020-12-06 DIAGNOSIS — M961 Postlaminectomy syndrome, not elsewhere classified: Secondary | ICD-10-CM | POA: Diagnosis present

## 2020-12-06 DIAGNOSIS — K219 Gastro-esophageal reflux disease without esophagitis: Secondary | ICD-10-CM | POA: Diagnosis present

## 2020-12-06 DIAGNOSIS — Z8614 Personal history of Methicillin resistant Staphylococcus aureus infection: Secondary | ICD-10-CM | POA: Diagnosis not present

## 2020-12-06 DIAGNOSIS — Z79899 Other long term (current) drug therapy: Secondary | ICD-10-CM | POA: Diagnosis not present

## 2020-12-06 LAB — COMPREHENSIVE METABOLIC PANEL
ALT: 17 U/L (ref 0–44)
AST: 34 U/L (ref 15–41)
Albumin: 3.9 g/dL (ref 3.5–5.0)
Alkaline Phosphatase: 140 U/L — ABNORMAL HIGH (ref 38–126)
Anion gap: 11 (ref 5–15)
BUN: 9 mg/dL (ref 6–20)
CO2: 23 mmol/L (ref 22–32)
Calcium: 8.6 mg/dL — ABNORMAL LOW (ref 8.9–10.3)
Chloride: 103 mmol/L (ref 98–111)
Creatinine, Ser: 0.91 mg/dL (ref 0.61–1.24)
GFR, Estimated: >60 mL/min (ref >60)
Glucose, Bld: 116 mg/dL — ABNORMAL HIGH (ref 70–99)
Potassium: 3.2 mmol/L — ABNORMAL LOW (ref 3.5–5.1)
Sodium: 137 mmol/L (ref 135–145)
Total Bilirubin: 0.8 mg/dL (ref 0.3–1.2)
Total Protein: 7.5 g/dL (ref 6.5–8.1)

## 2020-12-06 LAB — CBC WITH DIFFERENTIAL/PLATELET
Abs Immature Granulocytes: 0.08 10*3/uL — ABNORMAL HIGH (ref 0.00–0.07)
Basophils Absolute: 0.1 10*3/uL (ref 0.0–0.1)
Basophils Relative: 1 %
Eosinophils Absolute: 0 10*3/uL (ref 0.0–0.5)
Eosinophils Relative: 0 %
HCT: 50.6 % (ref 39.0–52.0)
Hemoglobin: 17.3 g/dL — ABNORMAL HIGH (ref 13.0–17.0)
Immature Granulocytes: 0 %
Lymphocytes Relative: 11 %
Lymphs Abs: 2.1 10*3/uL (ref 0.7–4.0)
MCH: 33.3 pg (ref 26.0–34.0)
MCHC: 34.2 g/dL (ref 30.0–36.0)
MCV: 97.3 fL (ref 80.0–100.0)
Monocytes Absolute: 2.4 10*3/uL — ABNORMAL HIGH (ref 0.1–1.0)
Monocytes Relative: 12 %
Neutro Abs: 15.1 10*3/uL — ABNORMAL HIGH (ref 1.7–7.7)
Neutrophils Relative %: 76 %
Platelets: 198 10*3/uL (ref 150–400)
RBC: 5.2 MIL/uL (ref 4.22–5.81)
RDW: 13.7 % (ref 11.5–15.5)
WBC: 19.7 10*3/uL — ABNORMAL HIGH (ref 4.0–10.5)
nRBC: 0 % (ref 0.0–0.2)

## 2020-12-06 LAB — BLOOD GAS, VENOUS
Acid-Base Excess: 0.1 mmol/L (ref 0.0–2.0)
Bicarbonate: 25.2 mmol/L (ref 20.0–28.0)
FIO2: 21
O2 Saturation: 87.5 %
Patient temperature: 38.4
pCO2, Ven: 33.5 mmHg — ABNORMAL LOW (ref 44.0–60.0)
pH, Ven: 7.462 — ABNORMAL HIGH (ref 7.250–7.430)
pO2, Ven: 56.6 mmHg — ABNORMAL HIGH (ref 32.0–45.0)

## 2020-12-06 LAB — LACTIC ACID, PLASMA
Lactic Acid, Venous: 2.2 mmol/L (ref 0.5–1.9)
Lactic Acid, Venous: 1.6 mmol/L (ref 0.5–1.9)

## 2020-12-06 LAB — PROTIME-INR
INR: 1.3 — ABNORMAL HIGH (ref 0.8–1.2)
Prothrombin Time: 15.7 seconds — ABNORMAL HIGH (ref 11.4–15.2)

## 2020-12-06 LAB — MRSA NEXT GEN BY PCR, NASAL: MRSA by PCR Next Gen: NOT DETECTED

## 2020-12-06 LAB — CORTISOL-AM, BLOOD: Cortisol - AM: 22.4 ug/dL (ref 6.7–22.6)

## 2020-12-06 LAB — HIV ANTIBODY (ROUTINE TESTING W REFLEX): HIV Screen 4th Generation wRfx: NONREACTIVE

## 2020-12-06 LAB — VITAMIN B12: Vitamin B-12: 264 pg/mL (ref 180–914)

## 2020-12-06 LAB — PROCALCITONIN: Procalcitonin: 0.89 ng/mL

## 2020-12-06 LAB — TSH: TSH: 2.142 u[IU]/mL (ref 0.350–4.500)

## 2020-12-06 LAB — MAGNESIUM: Magnesium: 2.1 mg/dL (ref 1.7–2.4)

## 2020-12-06 LAB — CK: Total CK: 970 U/L — ABNORMAL HIGH (ref 49–397)

## 2020-12-06 MED ORDER — LORAZEPAM 2 MG/ML IJ SOLN
1.0000 mg | Freq: Once | INTRAMUSCULAR | Status: AC
  Administered 2020-12-06: 1 mg via INTRAVENOUS

## 2020-12-06 MED ORDER — LORAZEPAM 2 MG/ML IJ SOLN
1.0000 mg | INTRAMUSCULAR | Status: DC | PRN
  Administered 2020-12-06 (×2): 4 mg via INTRAVENOUS
  Administered 2020-12-06 (×2): 2 mg via INTRAVENOUS
  Administered 2020-12-06: 4 mg via INTRAVENOUS
  Filled 2020-12-06: qty 1
  Filled 2020-12-06 (×2): qty 2
  Filled 2020-12-06: qty 1

## 2020-12-06 MED ORDER — ACETAMINOPHEN 650 MG RE SUPP: 650.0000 mg | Freq: Four times a day (QID) | RECTAL | Status: DC | PRN

## 2020-12-06 MED ORDER — ASCORBIC ACID 500 MG PO TABS
500.0000 mg | ORAL_TABLET | Freq: Two times a day (BID) | ORAL | Status: DC
  Administered 2020-12-07 – 2020-12-12 (×11): 500 mg via ORAL
  Filled 2020-12-06 (×11): qty 1

## 2020-12-06 MED ORDER — CHLORHEXIDINE GLUCONATE CLOTH 2 % EX PADS
6.0000 | MEDICATED_PAD | Freq: Every day | CUTANEOUS | Status: DC
  Administered 2020-12-07 – 2020-12-09 (×3): 6 via TOPICAL

## 2020-12-06 MED ORDER — THIAMINE HCL 100 MG/ML IJ SOLN
100.0000 mg | Freq: Every day | INTRAMUSCULAR | Status: DC
  Administered 2020-12-06: 100 mg via INTRAVENOUS
  Filled 2020-12-06 (×2): qty 2

## 2020-12-06 MED ORDER — METOPROLOL TARTRATE 5 MG/5ML IV SOLN
5.0000 mg | Freq: Three times a day (TID) | INTRAVENOUS | Status: DC | PRN
  Administered 2020-12-06: 5 mg via INTRAVENOUS
  Filled 2020-12-06: qty 5

## 2020-12-06 MED ORDER — HYDRALAZINE HCL 20 MG/ML IJ SOLN
10.0000 mg | INTRAMUSCULAR | Status: DC | PRN
Start: 2020-12-06 — End: 2020-12-12

## 2020-12-06 MED ORDER — FOLIC ACID 1 MG PO TABS
1.0000 mg | ORAL_TABLET | Freq: Every day | ORAL | Status: DC
  Administered 2020-12-07 – 2020-12-12 (×6): 1 mg via ORAL
  Filled 2020-12-06 (×7): qty 1

## 2020-12-06 MED ORDER — CLONIDINE HCL 0.1 MG PO TABS
0.1000 mg | ORAL_TABLET | Freq: Two times a day (BID) | ORAL | Status: DC
  Administered 2020-12-07: 0.1 mg via ORAL
  Filled 2020-12-06 (×3): qty 1

## 2020-12-06 MED ORDER — LORAZEPAM 2 MG/ML IJ SOLN
INTRAMUSCULAR | Status: AC
  Filled 2020-12-06: qty 2

## 2020-12-06 MED ORDER — ZINC SULFATE 220 (50 ZN) MG PO CAPS
220.0000 mg | ORAL_CAPSULE | Freq: Every day | ORAL | Status: DC
  Administered 2020-12-07 – 2020-12-12 (×6): 220 mg via ORAL
  Filled 2020-12-06 (×6): qty 1

## 2020-12-06 MED ORDER — HALOPERIDOL 2 MG PO TABS
2.0000 mg | ORAL_TABLET | Freq: Four times a day (QID) | ORAL | Status: DC | PRN
  Administered 2020-12-08 – 2020-12-09 (×2): 2 mg via ORAL
  Filled 2020-12-06 (×2): qty 1

## 2020-12-06 MED ORDER — IMIPRAMINE HCL 10 MG PO TABS
10.0000 mg | ORAL_TABLET | Freq: Every day | ORAL | Status: DC
  Administered 2020-12-07 – 2020-12-11 (×5): 10 mg via ORAL
  Filled 2020-12-06 (×7): qty 1

## 2020-12-06 MED ORDER — DRONABINOL 5 MG PO CAPS
5.0000 mg | ORAL_CAPSULE | Freq: Two times a day (BID) | ORAL | Status: DC
  Administered 2020-12-07 – 2020-12-12 (×11): 5 mg via ORAL
  Filled 2020-12-06 (×11): qty 1

## 2020-12-06 MED ORDER — DIPHENHYDRAMINE HCL 25 MG PO CAPS
25.0000 mg | ORAL_CAPSULE | Freq: Four times a day (QID) | ORAL | Status: DC | PRN
  Administered 2020-12-09 – 2020-12-11 (×3): 25 mg via ORAL
  Filled 2020-12-06 (×3): qty 1

## 2020-12-06 MED ORDER — POTASSIUM CHLORIDE 10 MEQ/100ML IV SOLN
10.0000 meq | INTRAVENOUS | Status: AC
Start: 2020-12-06 — End: 2020-12-06
  Administered 2020-12-06 (×5): 10 meq via INTRAVENOUS
  Filled 2020-12-06 (×5): qty 100

## 2020-12-06 MED ORDER — DEXTROSE-NACL 5-0.45 % IV SOLN: INTRAVENOUS | Status: DC

## 2020-12-06 MED ORDER — HEPARIN SODIUM (PORCINE) 5000 UNIT/ML IJ SOLN
5000.0000 [IU] | Freq: Three times a day (TID) | INTRAMUSCULAR | Status: DC
  Administered 2020-12-06 – 2020-12-12 (×17): 5000 [IU] via SUBCUTANEOUS
  Filled 2020-12-06 (×18): qty 1

## 2020-12-06 MED ORDER — THIAMINE HCL 100 MG PO TABS
100.0000 mg | ORAL_TABLET | Freq: Every day | ORAL | Status: DC
  Administered 2020-12-07 – 2020-12-12 (×6): 100 mg via ORAL
  Filled 2020-12-06 (×6): qty 1

## 2020-12-06 MED ORDER — LORAZEPAM 2 MG/ML IJ SOLN
INTRAMUSCULAR | Status: AC
  Administered 2020-12-06: 1 mg via INTRAVENOUS
  Filled 2020-12-06: qty 1

## 2020-12-06 MED ORDER — ENSURE ENLIVE PO LIQD
Freq: Four times a day (QID) | ORAL | Status: DC
  Administered 2020-12-08 – 2020-12-11 (×2): 1 via ORAL

## 2020-12-06 MED ORDER — LORAZEPAM 1 MG PO TABS
1.0000 mg | ORAL_TABLET | ORAL | Status: DC | PRN
  Administered 2020-12-07: 2 mg via ORAL
  Filled 2020-12-06: qty 2

## 2020-12-06 MED ORDER — MORPHINE SULFATE (PF) 4 MG/ML IV SOLN
4.0000 mg | INTRAVENOUS | Status: DC | PRN
  Administered 2020-12-06 (×2): 4 mg via INTRAVENOUS
  Filled 2020-12-06 (×2): qty 1

## 2020-12-06 MED ORDER — OXYCODONE HCL 5 MG PO TABS
15.0000 mg | ORAL_TABLET | ORAL | Status: DC | PRN
  Administered 2020-12-07 – 2020-12-10 (×12): 15 mg via ORAL
  Administered 2020-12-10: 10 mg via ORAL
  Administered 2020-12-11 – 2020-12-12 (×6): 15 mg via ORAL
  Filled 2020-12-06 (×19): qty 3

## 2020-12-06 MED ORDER — DEXMEDETOMIDINE HCL IN NACL 400 MCG/100ML IV SOLN
0.4000 ug/kg/h | INTRAVENOUS | Status: DC
  Administered 2020-12-06 (×2): 0.4 ug/kg/h via INTRAVENOUS
  Administered 2020-12-06: 1.2 ug/kg/h via INTRAVENOUS
  Filled 2020-12-06 (×3): qty 100

## 2020-12-06 MED ORDER — LORAZEPAM 2 MG/ML IJ SOLN
0.0000 mg | Freq: Two times a day (BID) | INTRAMUSCULAR | Status: AC
  Administered 2020-12-08: 2 mg via INTRAVENOUS
  Administered 2020-12-08: 1 mg via INTRAVENOUS
  Filled 2020-12-06 (×3): qty 1

## 2020-12-06 MED ORDER — ADULT MULTIVITAMIN W/MINERALS CH
1.0000 | ORAL_TABLET | Freq: Every day | ORAL | Status: DC
  Administered 2020-12-07 – 2020-12-12 (×6): 1 via ORAL
  Filled 2020-12-06 (×6): qty 1

## 2020-12-06 MED ORDER — METOPROLOL TARTRATE 5 MG/5ML IV SOLN: 5.0000 mg | INTRAVENOUS | Status: DC | PRN

## 2020-12-06 MED ORDER — SENNOSIDES-DOCUSATE SODIUM 8.6-50 MG PO TABS: 1.0000 | ORAL_TABLET | Freq: Every evening | ORAL | Status: DC | PRN

## 2020-12-06 MED ORDER — HALOPERIDOL LACTATE 5 MG/ML IJ SOLN
2.0000 mg | Freq: Four times a day (QID) | INTRAMUSCULAR | Status: DC | PRN
Start: 2020-12-06 — End: 2020-12-12
  Administered 2020-12-06: 2 mg via INTRAMUSCULAR
  Filled 2020-12-06: qty 1

## 2020-12-06 MED ORDER — ACETAMINOPHEN 325 MG PO TABS
650.0000 mg | ORAL_TABLET | Freq: Four times a day (QID) | ORAL | Status: DC | PRN
  Administered 2020-12-09 – 2020-12-12 (×6): 650 mg via ORAL
  Filled 2020-12-06 (×6): qty 2

## 2020-12-06 MED ORDER — LORAZEPAM 2 MG/ML IJ SOLN
0.0000 mg | Freq: Four times a day (QID) | INTRAMUSCULAR | Status: AC
  Administered 2020-12-06 – 2020-12-07 (×2): 1 mg via INTRAVENOUS
  Filled 2020-12-06 (×2): qty 1

## 2020-12-06 MED ORDER — POTASSIUM CHLORIDE 10 MEQ/100ML IV SOLN
10.0000 meq | INTRAVENOUS | Status: AC
Start: 2020-12-06 — End: 2020-12-06
  Administered 2020-12-06 (×2): 10 meq via INTRAVENOUS
  Filled 2020-12-06 (×2): qty 100

## 2020-12-06 MED ORDER — ONDANSETRON HCL 4 MG/2ML IJ SOLN
4.0000 mg | Freq: Four times a day (QID) | INTRAMUSCULAR | Status: DC | PRN
  Administered 2020-12-10 – 2020-12-11 (×2): 4 mg via INTRAVENOUS
  Filled 2020-12-06 (×2): qty 2

## 2020-12-06 MED ORDER — TRAZODONE HCL 50 MG PO TABS
50.0000 mg | ORAL_TABLET | Freq: Every evening | ORAL | Status: DC | PRN
  Administered 2020-12-09 – 2020-12-11 (×4): 50 mg via ORAL
  Filled 2020-12-06 (×4): qty 1

## 2020-12-06 MED ORDER — VANCOMYCIN HCL IN DEXTROSE 1-5 GM/200ML-% IV SOLN
1000.0000 mg | Freq: Two times a day (BID) | INTRAVENOUS | Status: DC
  Administered 2020-12-06 (×2): 1000 mg via INTRAVENOUS
  Filled 2020-12-06 (×2): qty 200

## 2020-12-06 MED ORDER — IPRATROPIUM-ALBUTEROL 0.5-2.5 (3) MG/3ML IN SOLN
3.0000 mL | RESPIRATORY_TRACT | Status: DC | PRN
  Administered 2020-12-06 – 2020-12-08 (×2): 3 mL via RESPIRATORY_TRACT
  Filled 2020-12-06 (×2): qty 3

## 2020-12-06 MED ORDER — ONDANSETRON HCL 4 MG PO TABS
4.0000 mg | ORAL_TABLET | Freq: Four times a day (QID) | ORAL | Status: DC | PRN
  Administered 2020-12-08 – 2020-12-12 (×2): 4 mg via ORAL
  Filled 2020-12-06 (×2): qty 1

## 2020-12-06 NOTE — Progress Notes (Signed)
PROGRESS NOTE    Jared Li  GEX:[id number] DOB: 12-Apr-1965 DOA: 12/05/2020 PCP: Celene Squibb, MD   Brief Narrative:  55 year old with history of adjustment disorder, cardiopulmonary syndrome, cervical radiculopathy, chronic intractable pain, GERD, cauda equina syndrome lumbar postlaminectomy, tobacco use presents to the ED with altered mental status.  Initially patient was very agitated, typically uses large amounts of Dilaudid and oxycodone for chronic back pain.  Upon admission she was febrile, tachycardic and hypertensive.  COVID-19 negative.  Chest x-ray unremarkable, UDS positive for marijuana and opioids.  CT head did not show any acute pathology but showed multifocal dental periapical lucencies.  CT abdomen pelvis was questionable for bilateral pyelonephritis.  Patient was admitted for agitation, sepsis.   Assessment & Plan:   Active Problems:   Sepsis ([place])   Acute pyelonephritis   Hypokalemia   Opiate dependence ([place])   Acute metabolic encephalopathy - Multifactorial.  Withdrawal from opioids, underlying infection -CT head negative, shows periapical dental lucencies On Precedex drip, plan is to slowly wean off today.  As needed Haldol ordered.  Sepsis secondary to acute pyelonephritis - Sepsis physiology is improving.  Lactic acidosis has resolved.  Follow-up culture data - Antibiotics- Vanc & Cefepime - Procalcitonin 0.89  Positive urine dipstick; CK elevated- Rhadomyolysis - Continue IV fluids  Hypokalemia - Replete as necessary  Chronic pain, opiate dependence - At home he is on hydromorphone 60 mg twice daily, Roxicodone 15 mg every 4 hours as needed. - Currently on Precedex drip due to agitation  GERD - PPI at home.  History of adjustment disorder - On Zoloft at home  Tobacco use - As needed bronchodilators    DVT prophylaxis: SQ heparin Code Status: Full Family Communication:  Met with brother at bedside  Status is: Inpatient  Remains  inpatient appropriate because:Inpatient level of care appropriate due to severity of illness.  Patient is still on Precedex drip, slowly being weaned off.  Dispo: The patient is from: Home              Anticipated d/c is to: Home              Patient currently is not medically stable to d/c.   Difficult to place patient No        Subjective: Patient was sedated this morning on Precedex drip.  He appeared comfortable.  Brother was present at bedside during my visit again.   Examination: Constitutional: Not in acute distress.  Pinpoint bilateral pupils Respiratory: Clear to auscultation bilaterally Cardiovascular: Normal sinus rhythm, no rubs Abdomen: Nontender nondistended good bowel sounds Musculoskeletal: No edema noted Skin: No rashes seen Neurologic: Unable to assess Psychiatric: Unable to assess    Objective: Vitals:   12/06/20 0500 12/06/20 0600 12/06/20 0700 12/06/20 0726  BP: (!) 152/120 (!) 161/118 (!) 151/110   Pulse: (!) 101 (!) 103 99   Resp: (!) 26 (!) 24 (!) 24   Temp:    99 F (37.2 C)  TempSrc:    Axillary  SpO2: 96% 95% 96%   Weight:      Height:        Intake/Output Summary (Last 24 hours) at 12/06/2020 0825 Last data filed at 12/06/2020 0522 Gross per 24 hour  Intake 1158.77 ml  Output --  Net 1158.77 ml   Filed Weights   12/05/20 1659 12/06/20 0315  Weight: 95.3 kg 76.4 kg     Data Reviewed:   CBC: Recent Labs  Lab 12/05/20 1748 12/06/20 0422  WBC  25.5* 19.7*  NEUTROABS 20.8* 15.1*  HGB 19.1* 17.3*  HCT 55.0* 50.6  MCV 96.3 97.3  PLT 256 427   Basic Metabolic Panel: Recent Labs  Lab 12/05/20 1748 12/06/20 0422  NA 136 137  K 3.2* 3.2*  CL 101 103  CO2 22 23  GLUCOSE 128* 116*  BUN 10 9  CREATININE 1.21 0.91  CALCIUM 9.3 8.6*  MG  --  2.1   GFR: Estimated Creatinine Clearance: 94.7 mL/min (by C-G formula based on SCr of 0.91 mg/dL). Liver Function Tests: Recent Labs  Lab 12/05/20 1748 12/06/20 0422  AST 32 34   ALT 17 17  ALKPHOS 178* 140*  BILITOT 0.8 0.8  PROT 9.1* 7.5  ALBUMIN 4.9 3.9   No results for input(s): LIPASE, AMYLASE in the last 168 hours. No results for input(s): AMMONIA in the last 168 hours. Coagulation Profile: Recent Labs  Lab 12/06/20 0422  INR 1.3*   Cardiac Enzymes: Recent Labs  Lab 12/05/20 1748  CKTOTAL 625*   BNP (last 3 results) No results for input(s): PROBNP in the last 8760 hours. HbA1C: No results for input(s): HGBA1C in the last 72 hours. CBG: No results for input(s): GLUCAP in the last 168 hours. Lipid Profile: No results for input(s): CHOL, HDL, LDLCALC, TRIG, CHOLHDL, LDLDIRECT in the last 72 hours. Thyroid Function Tests: No results for input(s): TSH, T4TOTAL, FREET4, T3FREE, THYROIDAB in the last 72 hours. Anemia Panel: No results for input(s): VITAMINB12, FOLATE, FERRITIN, TIBC, IRON, RETICCTPCT in the last 72 hours. Sepsis Labs: Recent Labs  Lab 12/05/20 2006 12/05/20 2214 12/06/20 0101 12/06/20 0422 12/06/20 0704  PROCALCITON  --   --   --  0.89  --   LATICACIDVEN 2.3* 2.0* 2.2*  --  1.6    Recent Results (from the past 240 hour(s))  Culture, blood (routine x 2)     Status: None (Preliminary result)   Collection Time: 12/05/20  8:06 PM   Specimen: BLOOD RIGHT HAND  Result Value Ref Range Status   Specimen Description BLOOD RIGHT HAND  Final   Special Requests   Final    Blood Culture adequate volume BOTTLES DRAWN AEROBIC AND ANAEROBIC Performed at [facility], [address] [place], [place] [address]    Culture PENDING  Incomplete   Report Status PENDING  Incomplete  Culture, blood (routine x 2)     Status: None (Preliminary result)   Collection Time: 12/05/20  8:06 PM   Specimen: BLOOD LEFT FOREARM  Result Value Ref Range Status   Specimen Description BLOOD LEFT FOREARM  Final   Special Requests   Final    Blood Culture results may not be optimal due to an excessive volume of blood received in culture bottles BOTTLES  DRAWN AEROBIC AND ANAEROBIC Performed at [facility], [address], [place], [place] [address]    Culture PENDING  Incomplete   Report Status PENDING  Incomplete  Resp Panel by RT-PCR (Flu A&B, Covid) Nasopharyngeal Swab     Status: None   Collection Time: 12/05/20  8:13 PM   Specimen: Nasopharyngeal Swab; Nasopharyngeal(NP) swabs in vial transport medium  Result Value Ref Range Status   SARS Coronavirus 2 by RT PCR NEGATIVE NEGATIVE Final    Comment: (NOTE) SARS-CoV-2 target nucleic acids are NOT DETECTED.  The SARS-CoV-2 RNA is generally detectable in upper respiratory specimens during the acute phase of infection. The lowest concentration of SARS-CoV-2 viral copies this assay can detect is 138 copies/mL. A negative result does  not preclude SARS-Cov-2 infection and should not be used as the sole basis for treatment or other patient management decisions. A negative result may occur with  improper specimen collection/handling, submission of specimen other than nasopharyngeal swab, presence of viral mutation(s) within the areas targeted by this assay, and inadequate number of viral copies(<138 copies/mL). A negative result must be combined with clinical observations, patient history, and epidemiological information. The expected result is Negative.  Fact Sheet for Patients:  EntrepreneurPulse.com.au  Fact Sheet for Healthcare Providers:  IncredibleEmployment.be  This test is no t yet approved or cleared by the [place] FDA and  has been authorized for detection and/or diagnosis of SARS-CoV-2 by FDA under an Emergency Use Authorization (EUA). This EUA will remain  in effect (meaning this test can be used) for the duration of the COVID-19 declaration under Section 564(b)(1) of the Act, 21 U.S.C.section 360bbb-3(b)(1), unless the authorization is terminated  or revoked sooner.       Influenza A by PCR NEGATIVE NEGATIVE Final   Influenza  B by PCR NEGATIVE NEGATIVE Final    Comment: (NOTE) The Xpert Xpress SARS-CoV-2/FLU/RSV plus assay is intended as an aid in the diagnosis of influenza from Nasopharyngeal swab specimens and should not be used as a sole basis for treatment. Nasal washings and aspirates are unacceptable for Xpert Xpress SARS-CoV-2/FLU/RSV testing.  Fact Sheet for Patients: EntrepreneurPulse.com.au  Fact Sheet for Healthcare Providers: IncredibleEmployment.be  This test is not yet approved or cleared by the [place] FDA and has been authorized for detection and/or diagnosis of SARS-CoV-2 by FDA under an Emergency Use Authorization (EUA). This EUA will remain in effect (meaning this test can be used) for the duration of the COVID-19 declaration under Section 564(b)(1) of the Act, 21 U.S.C. section 360bbb-3(b)(1), unless the authorization is terminated or revoked.  Performed at [facility], [address] [place], [place] [address]   MRSA Next Gen by PCR, Nasal     Status: None   Collection Time: 12/06/20  2:46 AM   Specimen: Nasal Mucosa; Nasal Swab  Result Value Ref Range Status   MRSA by PCR Next Gen NOT DETECTED NOT DETECTED Final    Comment: (NOTE) The GeneXpert MRSA Assay (FDA approved for NASAL specimens only), is one component of a comprehensive MRSA colonization surveillance program. It is not intended to diagnose MRSA infection nor to guide or monitor treatment for MRSA infections. Test performance is not FDA approved in patients less than 42 years old. Performed at [facility], [address], [place], [place] [address]          Radiology Studies: CT HEAD WO CONTRAST (5MM)  Result Date: 12/05/2020 CLINICAL DATA:  Delirium EXAM: CT HEAD WITHOUT CONTRAST CT MAXILLOFACIAL WITHOUT CONTRAST TECHNIQUE: Multidetector CT imaging of the head and maxillofacial structures were performed using the standard protocol without intravenous contrast.  Multiplanar CT image reconstructions of the maxillofacial structures were also generated. COMPARISON:  None. FINDINGS: Motion degraded examination. Technologist reported patient combative during the exam. CT HEAD FINDINGS Brain: No evidence of acute infarction, hemorrhage, hydrocephalus, extra-axial collection or mass lesion/mass effect. Vascular: No hyperdense vessel or unexpected calcification. Skull: Normal. Negative for fracture or focal lesion. Other: None. CT MAXILLOFACIAL FINDINGS Osseous: No acute maxillofacial bone fracture. Bony orbital walls are intact. Mandible intact. Temporomandibular joints are aligned without dislocation. Multifocal dental periapical lucencies. Orbits: Negative. No traumatic or inflammatory finding. Sinuses: Clear. Soft tissues: No soft tissue swelling or hematoma. IMPRESSION: 1. This limitation there is motion degraded  examination. Within no evidence of an acute intracranial process. 2. No acute maxillofacial bone fracture. 3. Multifocal dental periapical lucencies. Electronically Signed   By: Davina Poke D.O.   On: 12/05/2020 18:11   CT ABDOMEN PELVIS W CONTRAST  Result Date: 12/05/2020 CLINICAL DATA:  Abdominal pain with fever. EXAM: CT ABDOMEN AND PELVIS WITH CONTRAST TECHNIQUE: Multidetector CT imaging of the abdomen and pelvis was performed using the standard protocol following bolus administration of intravenous contrast. CONTRAST:  63m OMNIPAQUE IOHEXOL 350 MG/ML SOLN COMPARISON:  None. FINDINGS: Lower chest: No acute abnormality. Hepatobiliary: No focal liver abnormality is seen. No gallstones, gallbladder wall thickening, or biliary dilatation. Pancreas: Unremarkable. No pancreatic ductal dilatation or surrounding inflammatory changes. Spleen: Normal in size without focal abnormality. Adrenals/Urinary Tract: There is limited evaluation secondary to motion artifact. There are questionable areas of subtle patchy hypodensity throughout the bilateral renal parenchyma,  although this may be related to streak artifact. Kidneys are normal in size and contour. There is no hydronephrosis or perinephric fluid. The bladder is within normal limits. Stomach/Bowel: Stomach is within normal limits. Appendix appears normal. No evidence of bowel wall thickening, distention, or inflammatory changes. Vascular/Lymphatic: Aortic atherosclerosis. No enlarged abdominal or pelvic lymph nodes. Reproductive: Not well evaluated secondary to motion artifact. Other: No abdominal wall hernia or abnormality. No abdominopelvic ascites. Musculoskeletal: L4-S1 posterior fusion hardware is present and appears uncomplicated. No acute fractures are seen. IMPRESSION: 1. Questionable patchy areas of hypodensity throughout the bilateral renal parenchyma which would be concerning for pyelonephritis. Note is made that there is artifact in this region. Please correlate clinically. No hydronephrosis or perinephric fluid collection. 2.  Aortic Atherosclerosis (ICD10-I70.0). Electronically Signed   By: ARonney AstersM.D.   On: 12/05/2020 23:40   DG Chest Portable 1 View  Result Date: 12/05/2020 CLINICAL DATA:  Altered mental status. EXAM: PORTABLE CHEST 1 VIEW COMPARISON:  06/04/2016 FINDINGS: The cardiac silhouette, mediastinal and hilar contours are within normal limits. The lungs are clear of an acute process. No infiltrates, edema or effusions. No pulmonary lesions. The bony thorax is intact. IMPRESSION: No acute cardiopulmonary findings. Electronically Signed   By: PMarijo SanesM.D.   On: 12/05/2020 20:03   CT Maxillofacial Wo Contrast  Result Date: 12/05/2020 CLINICAL DATA:  [place] EXAM: CT HEAD WITHOUT CONTRAST CT MAXILLOFACIAL WITHOUT CONTRAST TECHNIQUE: Multidetector CT imaging of the head and maxillofacial structures were performed using the standard protocol without intravenous contrast. Multiplanar CT image reconstructions of the maxillofacial structures were also generated. COMPARISON:  None.  FINDINGS: Motion degraded examination. Technologist reported patient combative during the exam. CT HEAD FINDINGS Brain: No evidence of acute infarction, hemorrhage, hydrocephalus, extra-axial collection or mass lesion/mass effect. Vascular: No hyperdense vessel or unexpected calcification. Skull: Normal. Negative for fracture or focal lesion. Other: None. CT MAXILLOFACIAL FINDINGS Osseous: No acute maxillofacial bone fracture. Bony orbital walls are intact. Mandible intact. Temporomandibular joints are aligned without dislocation. Multifocal dental periapical lucencies. Orbits: Negative. No traumatic or inflammatory finding. Sinuses: Clear. Soft tissues: No soft tissue swelling or hematoma. IMPRESSION: 1. This limitation there is motion degraded examination. Within no evidence of an acute intracranial process. 2. No acute maxillofacial bone fracture. 3. Multifocal dental periapical lucencies. Electronically Signed   By: NDavina PokeD.O.   On: 12/05/2020 18:11        Scheduled Meds:  vitamin C  500 mg Oral BID   [START ON 12/07/2020] Chlorhexidine Gluconate Cloth  6 each Topical Daily   cloNIDine  0.1 mg Oral  BID   dronabinol  5 mg Oral BID AC   feeding supplement   Oral QID   folic acid  1 mg Oral Daily   heparin  5,000 Units Subcutaneous Q8H   imipramine  10 mg Oral QHS   LORazepam  0-4 mg Intravenous Q6H   Followed by   Derrill Memo ON 12/08/2020] LORazepam  0-4 mg Intravenous Q12H   multivitamin with minerals  1 tablet Oral Daily   thiamine  100 mg Oral Daily   Or   thiamine  100 mg Intravenous Daily   zinc sulfate  220 mg Oral Daily   Continuous Infusions:  ceFEPime (MAXIPIME) IV 2 g (12/06/20 0346)   dexmedetomidine (PRECEDEX) IV infusion 1.2 mcg/kg/hr (12/06/20 0734)   lactated ringers 150 mL/hr at 12/06/20 0306   potassium chloride 10 mEq (12/06/20 0736)   vancomycin       LOS: 0 days   Time spent= 35 mins    Mirabella Hilario Arsenio Loader, MD [facility]  If 7PM-7AM, please  contact night-coverage  12/06/2020, 8:25 AM

## 2020-12-06 NOTE — ED Notes (Signed)
Date and time results received: 12/06/20 1:43 AM  Test: Lactic Acid Critical Value: 2.2  Name of Provider Notified: Dr. Herma Carson  Orders Received? Or Actions Taken?:

## 2020-12-06 NOTE — Progress Notes (Signed)
Pharmacy Antibiotic Note  Jared Li is a 55 y.o. male admitted on 12/05/2020 with .  Pharmacy has been consulted for cefepime and vancomycin dosing for infection of unknown source.  WBC 25.5, 101 F (rectal); Scr 1.21, CrCl ~80 ml/min  Plan: Cefepime 2 gm IV Q 8 hrs Change Vancomycin 1000mg  IV q12h for AUC 464 (goal AUC is 400-550) Monitor WBC, temp, clinical course, cultures, renal function, vancomycin levels as indicated  Height: 5\' 10"  (177.8 cm) Weight: 76.4 kg (168 lb 6.9 oz) IBW/kg (Calculated) : 73  Temp (24hrs), Avg:99.7 F (37.6 C), Min:98.1 F (36.7 C), Max:101.2 F (38.4 C)  Recent Labs  Lab 12/05/20 1748 12/05/20 2006 12/05/20 2214 12/06/20 0101 12/06/20 0422 12/06/20 0704  WBC 25.5*  --   --   --  19.7*  --   CREATININE 1.21  --   --   --  0.91  --   LATICACIDVEN  --  2.3* 2.0* 2.2*  --  1.6     Estimated Creatinine Clearance: 94.7 mL/min (by C-G formula based on SCr of 0.91 mg/dL).    Allergies  Allergen Reactions   Adhesive [Tape] Other (See Comments)    Skin blisters   Codeine Nausea And Vomiting   Morphine And Related     Itching     Antimicrobials this admission: Cefepime 9/20 >> Vancomycin 9/20 >> Metronidazole X 1 9/20 >>  Microbiology results: 9/20 Respiratory panel:  pending  9/20 Bld cx X 2:  ngtd 9/20 UCX: pending 9/21 MRSA PCR is negative  Thank you for allowing pharmacy to be a part of this patient's care.  10/20, BS 10/21, Elder Cyphers Clinical Pharmacist Pager [phone] 12/06/2020 10:32 AM

## 2020-12-06 NOTE — Progress Notes (Signed)
Sepsis tracking by eLINK 

## 2020-12-06 NOTE — Progress Notes (Signed)
EEG completed, results pending. 

## 2020-12-06 NOTE — Progress Notes (Signed)
0600:  Pt wallet taken by security to put in safe.  Pt home medications were taken to pharmacy by the [facility].  Narcotics counted and verified by two RN's.  Will inform on-coming nurse.

## 2020-12-06 NOTE — Procedures (Signed)
Routine EEG Report  Jared Li is a 55 y.o. male with a history of altered mental status who is undergoing an EEG to evaluate for seizures.  Report: This EEG was acquired with electrodes placed according to the International 10-20 electrode system (including Fp1, Fp2, F3, F4, C3, C4, P3, P4, O1, O2, T3, T4, T5, T6, A1, A2, Fz, Cz, Pz). The following electrodes were missing or displaced: none.  There was no waking rhythm observed. The best background was 6-7 Hz with intermittent superimposed more pronounced diffuse slowing. This activity is reactive to stimulation. Sleep was manifested as K complexes and sleep spindles. There was no focal slowing. There were no interictal epileptiform discharges. There were no electrographic seizures identified. Photic stimulation and hyperventilation were not performed.   Impression and clinical correlation: This EEG was obtained while sedated with ativan and asleep and is abnormal due to moderate diffuse slowing, indicative of global cerebral dysfunction.  Bing Neighbors, MD [facility] [phone]  If 7pm- 7am, please page neurology on call as listed in AMION.

## 2020-12-06 NOTE — H&P (Signed)
TRH H&P    Patient Demographics:    Jared Li, is a 55 y.o. male  MRN: [id number]  DOB - Nov 01, 1965  Admit Date - 12/05/2020  Referring MD/NP/PA: Alvino Chapel  Outpatient Primary MD for the patient is Celene Squibb, MD  Patient coming from: Home  Chief complaint-altered mental status   HPI:    Jared Li  is a 55 y.o. male, with history of adjustment disorder, cardiopulmonary syndrome, cervical radiculopathy, chronic intractable headache, GERD, lumbar postlaminectomy syndrome, tobacco use disorder, and more presents ED with a chief complaint of altered mental status.  Apparently patient brother found him and called 911.  Patient received intranasal Narcan, and then became very agitated.  Patient is on chronic large amounts of Dilaudid and oxycodone at home for chronic back pain.  Patient would not provide any history to the ED provider, but was moving all of his extremities due to pain.  07 (he was too agitated to lay still for it so he received a couple doses of Ativan.  The time I saw him patient is very somnolent most likely secondary to Ativan.  Patient would not provide me with any history either.  In the ED Temp 101, heart rate 10, respiratory rate 27, blood pressure 168/108, satting 94% COVID-negative Lactic acid greater than 2x2 continue to trend Blood culture pending Chest x-ray shows no cardiopulmonary findings UDS is positive for marijuana and opiates Alcohol is less than 10 Red blood cell count 25.5 BUN is 3.2, creatinine 1.21 Alk phos 178, CPK 625 CT head is motion degraded and shows no intracranial process that is acute.  No acute maxillofacial bone fracture.  Multifocal dental periapical lucencies EKG shows a heart rate of 109, sinus tach, QTC 407 with PVC Patient started on vancomycin and cefepime CT abdomen shows questionable areas of hypodensity throughout the bilateral renal  parenchyma which could be concerning for pyelonephritis. Admission requested for further work-up and treatment of sepsis    Review of systems:     patient is unable to provide review of systems secondary to altered mental status    Past History of the following :    Past Medical History:  Diagnosis Date   Adjustment disorder    Arthritis    Asthma    Cauda equina syndrome (HCC)    Cellulitis of lower limb    Cervical radiculopathy    Chronic intractable headache    Mirgraines- lst one 06/02/16   Chronic pain    Edema    GERD (gastroesophageal reflux disease)    History of kidney stones    passed   Insomnia    Loose stools    Lumbar post-laminectomy syndrome    MRSA bacteremia    Open wound of lumbar region with complication    Physical deconditioning    Tobacco use disorder       Past Surgical History:  Procedure Laterality Date   APPLICATION OF A-CELL OF HEAD/NECK N/A 06/13/2016   Procedure: neck closure AFTER SPINE SURGERY WITH placement of wound vac.;  Surgeon: Loel Lofty  Dillingham, DO;  Location: [facility];  Service: Plastics;  Laterality: N/A;   CARPAL TUNNEL RELEASE Bilateral    CERVICAL FUSION     Numerous   LUMBAR FUSION     Numerous   Pain Pump insertion     x 2   POSTERIOR CERVICAL FUSION/FORAMINOTOMY N/A 06/13/2016   Procedure: POSTERIOR CERVICAL DECOMPRESSION FUSION, CERVICAL 3-4, CERVICAL 4-5, CERVICAL 5-6, CERVICAL 6-7 WITH INSTRUMENTATION AND ALLOGRAFT;  Surgeon: Phylliss Bob, MD;  Location: [facility];  Service: Orthopedics;  Laterality: N/A;  POSTERIOR CERVICAL DECOMPRESSION FUSION, CERVICAL 3-4, CERVICAL 4-5, CERVICAL 5-6, CERVICAL 6-7 WITH INSTRUMENTATION AND ALLOGRAFT   POSTERIOR LAMINECTOMY / DECOMPRESSION LUMBAR SPINE     removal of pain pump     x 2   Spinal Card Stimulator Removal      x 2   SPINAL CORD STIMULATOR INSERTION      x2      Social History:      Social History   Tobacco Use   Smoking status: Former    Years: 35.00    Types:  Cigarettes    Quit date: 06/02/2016    Years since quitting: 4.5   Smokeless tobacco: Former   Tobacco comments:    used chewing tabacco as a child  Substance Use Topics   Alcohol use: No    Alcohol/week: 0.0 standard drinks    Comment: occasional       Family History :     Family History  Family history unknown: Yes      Home Medications:   Prior to Admission medications   Medication Sig Start Date End Date Taking? Authorizing Provider  albuterol (PROVENTIL HFA;VENTOLIN HFA) 108 (90 Base) MCG/ACT inhaler Inhale 2 puffs into the lungs every 6 (six) hours as needed for wheezing or shortness of breath.    [provider]  cloNIDine (CATAPRES) 0.1 MG tablet Take 0.1 mg by mouth 2 (two) times daily. 11/24/19   [provider]  diphenhydrAMINE (BENADRYL) 25 MG tablet Take 25 mg by mouth every 6 (six) hours as needed for itching.     [provider]  dronabinol (MARINOL) 5 MG capsule Take one capsule by mouth twice daily before meals 04/14/15   Eulas Post, Monica, DO  GRALISE 600 MG TABS Take 3,000 mg by mouth daily with supper. Takes 5 tablets at dinner 05/04/16   [provider]  HYDROmorphone HCl 16 MG T24A Take 16 mg by mouth 2 (two) times daily. 05/12/16   [provider]  ibuprofen (ADVIL) 800 MG tablet Take 1 tablet (800 mg total) by mouth 3 (three) times daily. 01/07/20   Avegno, Darrelyn Hillock, FNP  imipramine (TOFRANIL) 10 MG tablet Take 10 mg by mouth at bedtime.    [provider]  LORazepam (ATIVAN) 0.5 MG tablet Take 0.5 mg by mouth 2 (two) times daily as needed. 01/04/20   [provider]  metoprolol tartrate (LOPRESSOR) 25 MG tablet Take 1 tablet (25 mg total) by mouth once for 1 dose. Take 2 hours prior to your CT scan. 11/08/20 11/08/20  Kate Sable, MD  Multiple Vitamin (MULTIVITAMIN WITH MINERALS) TABS tablet Take 1 tablet by mouth daily.    [provider]  Nutritional Supplements (ENSURE HIGH PROTEIN  PO) Take 1 Can by mouth 4 (four) times daily.    [provider]  omeprazole (PRILOSEC OTC) 20 MG tablet Take 20 mg by mouth daily as needed.     [provider]  ondansetron (ZOFRAN) 4 MG  tablet Take 4 mg by mouth every 8 (eight) hours as needed for nausea or vomiting.    [provider]  oxyCODONE (ROXICODONE) 15 MG immediate release tablet Take 15 mg by mouth every 4 (four) hours as needed for pain.    [provider]  rizatriptan (MAXALT) 10 MG tablet Take 10 mg by mouth every 2 (two) hours as needed for migraine. May repeat in 2 hours if needed     [provider]  sertraline (ZOLOFT) 100 MG tablet Take 100 mg by mouth at bedtime. Takes 2 tablets    [provider]  Simethicone (GAS-X PO) Take 2 tablets by mouth 2 (two) times daily as needed (gas).    [provider]  vitamin C (ASCORBIC ACID) 500 MG tablet Take 500 mg by mouth 2 (two) times daily.    [provider]  zinc sulfate 220 (50 Zn) MG capsule Take 220 mg by mouth daily.    [provider]     Allergies:     Allergies  Allergen Reactions   Adhesive [Tape] Other (See Comments)    Skin blisters   Codeine Nausea And Vomiting   Morphine And Related     Itching      Physical Exam:   Vitals  Blood pressure (!) 152/120, pulse (!) 101, temperature 99.3 F (37.4 C), temperature source Oral, resp. rate (!) 26, height _0  (1.778 m), weight 76.4 kg, SpO2 96 %.  1.  General: Patient lying supine in bed,  no acute distress   2. Psychiatric: Somnolent, nonverbal, psychiatric assessment cannot be obtained   3. Neurologic:  somnolent, moving all 4 extremities voluntarily, no cervical, thoracic, or lumbar tenderness on palpation on my exam,   4. HEENMT:  Head is atraumatic, normocephalic, pupils reactive to light, neck is supple, trachea is midline, mucous membranes are moist   5. Respiratory : Lungs are clear to auscultation bilaterally  without wheezing, rhonchi, rales, no cyanosis, no increase in work of breathing or accessory muscle use   6. Cardiovascular : Heart rate normal, rhythm is regular, no murmurs, rubs or gallops, no peripheral edema, peripheral pulses palpated   7. Gastrointestinal:  Abdomen is soft, nondistended, nontender to palpation bowel sounds active, no masses or organomegaly palpated   8. Skin:  Skin is warm, dry and intact without rashes, acute lesions, or ulcers on limited exam   9.Musculoskeletal:  No acute deformities or trauma, no asymmetry in tone, no peripheral edema, peripheral pulses palpated, no tenderness to palpation in the extremities     Data Review:    CBC Recent Labs  Lab 12/05/20 1748 12/06/20 0422  WBC 25.5* 19.7*  HGB 19.1* 17.3*  HCT 55.0* 50.6  PLT 256 198  MCV 96.3 97.3  MCH 33.5 33.3  MCHC 34.7 34.2  RDW 13.6 13.7  LYMPHSABS 1.8 2.1  MONOABS 2.6* 2.4*  EOSABS 0.0 0.0  BASOSABS 0.1 0.1   ------------------------------------------------------------------------------------------------------------------  Results for orders placed or performed during the hospital encounter of 12/05/20 (from the past 48 hour(s))  Comprehensive metabolic panel     Status: Abnormal   Collection Time: 12/05/20  5:48 PM  Result Value Ref Range   Sodium 136 135 - 145 mmol/L   Potassium 3.2 (L) 3.5 - 5.1 mmol/L   Chloride 101 98 - 111 mmol/L   CO2 22 22 - 32 mmol/L   Glucose, Bld 128 (H) 70 - 99 mg/dL    Comment: Glucose reference range applies only to samples  taken after fasting for at least 8 hours.   BUN 10 6 - 20 mg/dL   Creatinine, Ser 1.21 0.61 - 1.24 mg/dL   Calcium 9.3 8.9 - 10.3 mg/dL   Total Protein 9.1 (H) 6.5 - 8.1 g/dL   Albumin 4.9 3.5 - 5.0 g/dL   AST 32 15 - 41 U/L   ALT 17 0 - 44 U/L   Alkaline Phosphatase 178 (H) 38 - 126 U/L   Total Bilirubin 0.8 0.3 - 1.2 mg/dL   GFR, Estimated >60 >60 mL/min    Comment: (NOTE) Calculated using the CKD-EPI Creatinine  Equation (2021)    Anion gap 13 5 - 15    Comment: Performed at [facility], [address], [place], [place] [address]  Ethanol     Status: None   Collection Time: 12/05/20  5:48 PM  Result Value Ref Range   Alcohol, Ethyl (B) <10 <10 mg/dL    Comment: (NOTE) Lowest detectable limit for serum alcohol is 10 mg/dL.  For medical purposes only. Performed at [facility], [address], [place], [place] [address]   CBC with Differential     Status: Abnormal   Collection Time: 12/05/20  5:48 PM  Result Value Ref Range   WBC 25.5 (H) 4.0 - 10.5 K/uL    Comment: REPEATED TO VERIFY WHITE COUNT CONFIRMED ON SMEAR    RBC 5.71 4.22 - 5.81 MIL/uL   Hemoglobin 19.1 (H) 13.0 - 17.0 g/dL   HCT 55.0 (H) 39.0 - 52.0 %   MCV 96.3 80.0 - 100.0 fL   MCH 33.5 26.0 - 34.0 pg   MCHC 34.7 30.0 - 36.0 g/dL   RDW 13.6 11.5 - 15.5 %   Platelets 256 150 - 400 K/uL   nRBC 0.0 0.0 - 0.2 %   Neutrophils Relative % 81 %   Neutro Abs 20.8 (H) 1.7 - 7.7 K/uL   Lymphocytes Relative 7 %   Lymphs Abs 1.8 0.7 - 4.0 K/uL   Monocytes Relative 10 %   Monocytes Absolute 2.6 (H) 0.1 - 1.0 K/uL   Eosinophils Relative 0 %   Eosinophils Absolute 0.0 0.0 - 0.5 K/uL   Basophils Relative 1 %   Basophils Absolute 0.1 0.0 - 0.1 K/uL   Immature Granulocytes 1 %   Abs Immature Granulocytes 0.19 (H) 0.00 - 0.07 K/uL   Polychromasia PRESENT    Ovalocytes PRESENT     Comment: Performed at [facility], [address] [place], [place] [address]  CK     Status: Abnormal   Collection Time: 12/05/20  5:48 PM  Result Value Ref Range   Total CK 625 (H) 49 - 397 U/L    Comment: Performed at [facility], [address] [place], [place] [address]  Urinalysis, Routine w reflex microscopic Urine, In & Out Cath     Status: Abnormal   Collection Time: 12/05/20  7:25 PM  Result Value Ref Range   Color, Urine YELLOW YELLOW   APPearance CLEAR CLEAR   Specific Gravity, Urine >1.030 (H) 1.005 - 1.030   pH 6.0 5.0 - 8.0   Glucose,  UA 100 (A) NEGATIVE mg/dL   Hgb urine dipstick LARGE (A) NEGATIVE   Bilirubin Urine NEGATIVE NEGATIVE   Ketones, ur NEGATIVE NEGATIVE mg/dL   Protein, ur 100 (A) NEGATIVE mg/dL   Nitrite NEGATIVE NEGATIVE   Leukocytes,Ua NEGATIVE NEGATIVE    Comment: Performed at [facility], [address], [place], [place] [address]  Urine rapid drug  screen (hosp performed)     Status: Abnormal   Collection Time: 12/05/20  7:25 PM  Result Value Ref Range   Opiates POSITIVE (A) NONE DETECTED   Cocaine NONE DETECTED NONE DETECTED   Benzodiazepines NONE DETECTED NONE DETECTED   Amphetamines NONE DETECTED NONE DETECTED   Tetrahydrocannabinol POSITIVE (A) NONE DETECTED   Barbiturates NONE DETECTED NONE DETECTED    Comment: (NOTE) DRUG SCREEN FOR MEDICAL PURPOSES ONLY.  IF CONFIRMATION IS NEEDED FOR ANY PURPOSE, NOTIFY LAB WITHIN 5 DAYS.  LOWEST DETECTABLE LIMITS FOR URINE DRUG SCREEN Drug Class                     Cutoff (ng/mL) Amphetamine and metabolites    1000 Barbiturate and metabolites    200 Benzodiazepine                 017 Tricyclics and metabolites     300 Opiates and metabolites        300 Cocaine and metabolites        300 THC                            50 Performed at [facility], [address], [place], [place] [address]   Urinalysis, Microscopic (reflex)     Status: Abnormal   Collection Time: 12/05/20  7:25 PM  Result Value Ref Range   RBC / HPF 6-10 0 - 5 RBC/hpf   WBC, UA 6-10 0 - 5 WBC/hpf   Bacteria, UA FEW (A) NONE SEEN   Squamous Epithelial / LPF 0-5 0 - 5   Amorphous Crystal PRESENT     Comment: Performed at [facility], [address] [place], [place] [address]  Culture, blood (routine x 2)     Status: None (Preliminary result)   Collection Time: 12/05/20  8:06 PM   Specimen: BLOOD RIGHT HAND  Result Value Ref Range   Specimen Description BLOOD RIGHT HAND    Special Requests      Blood Culture adequate volume BOTTLES DRAWN AEROBIC AND ANAEROBIC Performed at  [facility], [address], [place], [place] [address]    Culture PENDING    Report Status PENDING   Culture, blood (routine x 2)     Status: None (Preliminary result)   Collection Time: 12/05/20  8:06 PM   Specimen: BLOOD LEFT FOREARM  Result Value Ref Range   Specimen Description BLOOD LEFT FOREARM    Special Requests      Blood Culture results may not be optimal due to an excessive volume of blood received in culture bottles BOTTLES DRAWN AEROBIC AND ANAEROBIC Performed at [facility], [address], [place], [place] [address]    Culture PENDING    Report Status PENDING   Lactic acid, plasma     Status: Abnormal   Collection Time: 12/05/20  8:06 PM  Result Value Ref Range   Lactic Acid, Venous 2.3 (HH) 0.5 - 1.9 mmol/L    Comment: CRITICAL RESULT CALLED TO, READ BACK BY AND VERIFIED WITH: BELTON,C ON 12/05/20 AT 2050 BY LOY,C Performed at [facility], [address] [place], [place] [address]   Resp Panel by RT-PCR (Flu A&B, Covid) Nasopharyngeal Swab     Status: None   Collection Time: 12/05/20  8:13 PM   Specimen: Nasopharyngeal Swab; Nasopharyngeal(NP) swabs in vial transport medium  Result Value Ref Range   SARS Coronavirus 2 by RT PCR NEGATIVE NEGATIVE  Comment: (NOTE) SARS-CoV-2 target nucleic acids are NOT DETECTED.  The SARS-CoV-2 RNA is generally detectable in upper respiratory specimens during the acute phase of infection. The lowest concentration of SARS-CoV-2 viral copies this assay can detect is 138 copies/mL. A negative result does not preclude SARS-Cov-2 infection and should not be used as the sole basis for treatment or other patient management decisions. A negative result may occur with  improper specimen collection/handling, submission of specimen other than nasopharyngeal swab, presence of viral mutation(s) within the areas targeted by this assay, and inadequate number of viral copies(<138 copies/mL). A negative result must be combined with clinical  observations, patient history, and epidemiological information. The expected result is Negative.  Fact Sheet for Patients:  EntrepreneurPulse.com.au  Fact Sheet for Healthcare Providers:  IncredibleEmployment.be  This test is no t yet approved or cleared by the [place] FDA and  has been authorized for detection and/or diagnosis of SARS-CoV-2 by FDA under an Emergency Use Authorization (EUA). This EUA will remain  in effect (meaning this test can be used) for the duration of the COVID-19 declaration under Section 564(b)(1) of the Act, 21 U.S.C.section 360bbb-3(b)(1), unless the authorization is terminated  or revoked sooner.       Influenza A by PCR NEGATIVE NEGATIVE   Influenza B by PCR NEGATIVE NEGATIVE    Comment: (NOTE) The Xpert Xpress SARS-CoV-2/FLU/RSV plus assay is intended as an aid in the diagnosis of influenza from Nasopharyngeal swab specimens and should not be used as a sole basis for treatment. Nasal washings and aspirates are unacceptable for Xpert Xpress SARS-CoV-2/FLU/RSV testing.  Fact Sheet for Patients: EntrepreneurPulse.com.au  Fact Sheet for Healthcare Providers: IncredibleEmployment.be  This test is not yet approved or cleared by the [place] FDA and has been authorized for detection and/or diagnosis of SARS-CoV-2 by FDA under an Emergency Use Authorization (EUA). This EUA will remain in effect (meaning this test can be used) for the duration of the COVID-19 declaration under Section 564(b)(1) of the Act, 21 U.S.C. section 360bbb-3(b)(1), unless the authorization is terminated or revoked.  Performed at [facility] [address], [place], [place] [address]   Lactic acid, plasma     Status: Abnormal   Collection Time: 12/05/20 10:14 PM  Result Value Ref Range   Lactic Acid, Venous 2.0 (HH) 0.5 - 1.9 mmol/L    Comment: CRITICAL VALUE NOTED.  VALUE IS CONSISTENT WITH  PREVIOUSLY REPORTED AND CALLED VALUE. Performed at [facility], [address], [place], [place] [address]   Lactic acid, plasma     Status: Abnormal   Collection Time: 12/06/20  1:01 AM  Result Value Ref Range   Lactic Acid, Venous 2.2 (HH) 0.5 - 1.9 mmol/L    Comment: CRITICAL RESULT CALLED TO, READ BACK BY AND VERIFIED WITH: BRAME,M_0  BY MATTHEWS, B 9.21.22 Performed at [address] [place], [place] [address]   Blood gas, venous     Status: Abnormal   Collection Time: 12/06/20  1:01 AM  Result Value Ref Range   FIO2 21.00    pH, Ven 7.462 (H) 7.250 - 7.430   pCO2, Ven 33.5 (L) 44.0 - 60.0 mmHg   pO2, Ven 56.6 (H) 32.0 - 45.0 mmHg   Bicarbonate 25.2 20.0 - 28.0 mmol/L   Acid-Base Excess 0.1 0.0 - 2.0 mmol/L   O2 Saturation 87.5 %   Patient temperature 38.4     Comment: Performed at [facility], [address] [place], [place] [address]  Protime-INR  Status: Abnormal   Collection Time: 12/06/20  4:22 AM  Result Value Ref Range   Prothrombin Time 15.7 (H) 11.4 - 15.2 seconds   INR 1.3 (H) 0.8 - 1.2    Comment: (NOTE) INR goal varies based on device and disease states. Performed at [facility], [address] [place], [place] [address]   Procalcitonin     Status: None   Collection Time: 12/06/20  4:22 AM  Result Value Ref Range   Procalcitonin 0.89 ng/mL    Comment:        Interpretation: PCT > 0.5 ng/mL and <= 2 ng/mL: Systemic infection (sepsis) is possible, but other conditions are known to elevate PCT as well. (NOTE)       Sepsis PCT Algorithm           Lower Respiratory Tract                                      Infection PCT Algorithm    ----------------------------     ----------------------------         PCT < 0.25 ng/mL                PCT < 0.10 ng/mL          Strongly encourage             Strongly discourage   discontinuation of antibiotics    initiation of antibiotics    ----------------------------      -----------------------------       PCT 0.25 - 0.50 ng/mL            PCT 0.10 - 0.25 ng/mL               OR       >80% decrease in PCT            Discourage initiation of                                            antibiotics      Encourage discontinuation           of antibiotics    ----------------------------     -----------------------------         PCT >= 0.50 ng/mL              PCT 0.26 - 0.50 ng/mL                AND       <80% decrease in PCT             Encourage initiation of                                             antibiotics       Encourage continuation           of antibiotics    ----------------------------     -----------------------------        PCT >= 0.50 ng/mL                  PCT > 0.50 ng/mL               AND  increase in PCT                  Strongly encourage                                      initiation of antibiotics    Strongly encourage escalation           of antibiotics                                     -----------------------------                                           PCT <= 0.25 ng/mL                                                 OR                                        > 80% decrease in PCT                                      Discontinue / Do not initiate                                             antibiotics  Performed at [facility], [address], [place], [place] [address]   Comprehensive metabolic panel     Status: Abnormal   Collection Time: 12/06/20  4:22 AM  Result Value Ref Range   Sodium 137 135 - 145 mmol/L   Potassium 3.2 (L) 3.5 - 5.1 mmol/L   Chloride 103 98 - 111 mmol/L   CO2 23 22 - 32 mmol/L   Glucose, Bld 116 (H) 70 - 99 mg/dL    Comment: Glucose reference range applies only to samples taken after fasting for at least 8 hours.   BUN 9 6 - 20 mg/dL   Creatinine, Ser 0.91 0.61 - 1.24 mg/dL   Calcium 8.6 (L) 8.9 - 10.3 mg/dL   Total Protein 7.5 6.5 - 8.1 g/dL   Albumin 3.9 3.5 - 5.0 g/dL   AST 34 15  - 41 U/L   ALT 17 0 - 44 U/L   Alkaline Phosphatase 140 (H) 38 - 126 U/L   Total Bilirubin 0.8 0.3 - 1.2 mg/dL   GFR, Estimated >60 >60 mL/min    Comment: (NOTE) Calculated using the CKD-EPI Creatinine Equation (2021)    Anion gap 11 5 - 15    Comment: Performed at [facility], [address] [place], [place] [address]  Magnesium     Status: None   Collection Time: 12/06/20  4:22 AM  Result Value Ref Range   Magnesium 2.1 1.7 - 2.4 mg/dL    Comment: Performed at [facility], [address], [place], [place] [address]  CBC WITH DIFFERENTIAL     Status: Abnormal   Collection Time: 12/06/20  4:22 AM  Result Value Ref Range   WBC 19.7 (H) 4.0 - 10.5 K/uL   RBC 5.20 4.22 - 5.81 MIL/uL   Hemoglobin 17.3 (H) 13.0 - 17.0 g/dL   HCT 50.6 39.0 - 52.0 %   MCV 97.3 80.0 - 100.0 fL   MCH 33.3 26.0 - 34.0 pg   MCHC 34.2 30.0 - 36.0 g/dL   RDW 13.7 11.5 - 15.5 %   Platelets 198 150 - 400 K/uL   nRBC 0.0 0.0 - 0.2 %   Neutrophils Relative % 76 %   Neutro Abs 15.1 (H) 1.7 - 7.7 K/uL   Lymphocytes Relative 11 %   Lymphs Abs 2.1 0.7 - 4.0 K/uL   Monocytes Relative 12 %   Monocytes Absolute 2.4 (H) 0.1 - 1.0 K/uL   Eosinophils Relative 0 %   Eosinophils Absolute 0.0 0.0 - 0.5 K/uL   Basophils Relative 1 %   Basophils Absolute 0.1 0.0 - 0.1 K/uL   Immature Granulocytes 0 %   Abs Immature Granulocytes 0.08 (H) 0.00 - 0.07 K/uL    Comment: Performed at [facility], [address] [place], [place] [address]    Chemistries  Recent Labs  Lab 12/05/20 1748 12/06/20 0422  NA 136 137  K 3.2* 3.2*  CL 101 103  CO2 22 23  GLUCOSE 128* 116*  BUN 10 9  CREATININE 1.21 0.91  CALCIUM 9.3 8.6*  MG  --  2.1  AST 32 34  ALT 17 17  ALKPHOS 178* 140*  BILITOT 0.8 0.8    ------------------------------------------------------------------------------------------------------------------  ------------------------------------------------------------------------------------------------------------------ GFR: Estimated Creatinine Clearance: 94.7 mL/min (by C-G formula based on SCr of 0.91 mg/dL). Liver Function Tests: Recent Labs  Lab 12/05/20 1748 12/06/20 0422  AST 32 34  ALT 17 17  ALKPHOS 178* 140*  BILITOT 0.8 0.8  PROT 9.1* 7.5  ALBUMIN 4.9 3.9   No results for input(s): LIPASE, AMYLASE in the last 168 hours. No results for input(s): AMMONIA in the last 168 hours. Coagulation Profile: Recent Labs  Lab 12/06/20 0422  INR 1.3*   Cardiac Enzymes: Recent Labs  Lab 12/05/20 1748  CKTOTAL 625*   BNP (last 3 results) No results for input(s): PROBNP in the last 8760 hours. HbA1C: No results for input(s): HGBA1C in the last 72 hours. CBG: No results for input(s): GLUCAP in the last 168 hours. Lipid Profile: No results for input(s): CHOL, HDL, LDLCALC, TRIG, CHOLHDL, LDLDIRECT in the last 72 hours. Thyroid Function Tests: No results for input(s): TSH, T4TOTAL, FREET4, T3FREE, THYROIDAB in the last 72 hours. Anemia Panel: No results for input(s): VITAMINB12, FOLATE, FERRITIN, TIBC, IRON, RETICCTPCT in the last 72 hours.  --------------------------------------------------------------------------------------------------------------- Urine analysis:    Component Value Date/Time   COLORURINE YELLOW 12/05/2020 1925   APPEARANCEUR CLEAR 12/05/2020 1925   LABSPEC >1.030 (H) 12/05/2020 1925   PHURINE 6.0 12/05/2020 1925   GLUCOSEU 100 (A) 12/05/2020 1925   HGBUR LARGE (A) 12/05/2020 1925   BILIRUBINUR NEGATIVE 12/05/2020 1925   KETONESUR NEGATIVE 12/05/2020 1925   PROTEINUR 100 (A) 12/05/2020 1925   NITRITE NEGATIVE 12/05/2020 1925   LEUKOCYTESUR NEGATIVE 12/05/2020 1925      Imaging Results:    CT HEAD WO CONTRAST (5MM)  Result  Date: 12/05/2020 CLINICAL DATA:  [place] EXAM: CT HEAD WITHOUT CONTRAST CT MAXILLOFACIAL WITHOUT CONTRAST TECHNIQUE: Multidetector CT imaging of the  head and maxillofacial structures were performed using the standard protocol without intravenous contrast. Multiplanar CT image reconstructions of the maxillofacial structures were also generated. COMPARISON:  None. FINDINGS: Motion degraded examination. Technologist reported patient combative during the exam. CT HEAD FINDINGS Brain: No evidence of acute infarction, hemorrhage, hydrocephalus, extra-axial collection or mass lesion/mass effect. Vascular: No hyperdense vessel or unexpected calcification. Skull: Normal. Negative for fracture or focal lesion. Other: None. CT MAXILLOFACIAL FINDINGS Osseous: No acute maxillofacial bone fracture. Bony orbital walls are intact. Mandible intact. Temporomandibular joints are aligned without dislocation. Multifocal dental periapical lucencies. Orbits: Negative. No traumatic or inflammatory finding. Sinuses: Clear. Soft tissues: No soft tissue swelling or hematoma. IMPRESSION: 1. This limitation there is motion degraded examination. Within no evidence of an acute intracranial process. 2. No acute maxillofacial bone fracture. 3. Multifocal dental periapical lucencies. Electronically Signed   By: Davina Poke D.O.   On: 12/05/2020 18:11   CT ABDOMEN PELVIS W CONTRAST  Result Date: 12/05/2020 CLINICAL DATA:  Abdominal pain with fever. EXAM: CT ABDOMEN AND PELVIS WITH CONTRAST TECHNIQUE: Multidetector CT imaging of the abdomen and pelvis was performed using the standard protocol following bolus administration of intravenous contrast. CONTRAST:  89m OMNIPAQUE IOHEXOL 350 MG/ML SOLN COMPARISON:  None. FINDINGS: Lower chest: No acute abnormality. Hepatobiliary: No focal liver abnormality is seen. No gallstones, gallbladder wall thickening, or biliary dilatation. Pancreas: Unremarkable. No pancreatic ductal dilatation or  surrounding inflammatory changes. Spleen: Normal in size without focal abnormality. Adrenals/Urinary Tract: There is limited evaluation secondary to motion artifact. There are questionable areas of subtle patchy hypodensity throughout the bilateral renal parenchyma, although this may be related to streak artifact. Kidneys are normal in size and contour. There is no hydronephrosis or perinephric fluid. The bladder is within normal limits. Stomach/Bowel: Stomach is within normal limits. Appendix appears normal. No evidence of bowel wall thickening, distention, or inflammatory changes. Vascular/Lymphatic: Aortic atherosclerosis. No enlarged abdominal or pelvic lymph nodes. Reproductive: Not well evaluated secondary to motion artifact. Other: No abdominal wall hernia or abnormality. No abdominopelvic ascites. Musculoskeletal: L4-S1 posterior fusion hardware is present and appears uncomplicated. No acute fractures are seen. IMPRESSION: 1. Questionable patchy areas of hypodensity throughout the bilateral renal parenchyma which would be concerning for pyelonephritis. Note is made that there is artifact in this region. Please correlate clinically. No hydronephrosis or perinephric fluid collection. 2.  Aortic Atherosclerosis (ICD10-I70.0). Electronically Signed   By: ARonney AstersM.D.   On: 12/05/2020 23:40   DG Chest Portable 1 View  Result Date: 12/05/2020 CLINICAL DATA:  Altered mental status. EXAM: PORTABLE CHEST 1 VIEW COMPARISON:  06/04/2016 FINDINGS: The cardiac silhouette, mediastinal and hilar contours are within normal limits. The lungs are clear of an acute process. No infiltrates, edema or effusions. No pulmonary lesions. The bony thorax is intact. IMPRESSION: No acute cardiopulmonary findings. Electronically Signed   By: PMarijo SanesM.D.   On: 12/05/2020 20:03   CT Maxillofacial Wo Contrast  Result Date: 12/05/2020 CLINICAL DATA:  Delirium EXAM: CT HEAD WITHOUT CONTRAST CT MAXILLOFACIAL WITHOUT  CONTRAST TECHNIQUE: Multidetector CT imaging of the head and maxillofacial structures were performed using the standard protocol without intravenous contrast. Multiplanar CT image reconstructions of the maxillofacial structures were also generated. COMPARISON:  None. FINDINGS: Motion degraded examination. Technologist reported patient combative during the exam. CT HEAD FINDINGS Brain: No evidence of acute infarction, hemorrhage, hydrocephalus, extra-axial collection or mass lesion/mass effect. Vascular: No hyperdense vessel or unexpected calcification. Skull: Normal. Negative for fracture or focal lesion. Other:  None. CT MAXILLOFACIAL FINDINGS Osseous: No acute maxillofacial bone fracture. Bony orbital walls are intact. Mandible intact. Temporomandibular joints are aligned without dislocation. Multifocal dental periapical lucencies. Orbits: Negative. No traumatic or inflammatory finding. Sinuses: Clear. Soft tissues: No soft tissue swelling or hematoma. IMPRESSION: 1. This limitation there is motion degraded examination. Within no evidence of an acute intracranial process. 2. No acute maxillofacial bone fracture. 3. Multifocal dental periapical lucencies. Electronically Signed   By: Davina Poke D.O.   On: 12/05/2020 18:11       Assessment & Plan:    Active Problems:   Sepsis ([place])   Acute pyelonephritis   Hypokalemia   Opiate dependence ([place])   Acute metabolic encephalopathy Multifactorial with opiates, opiate withdrawal, Ativan, infection all contributing CT head is mostly greater but shows no acute intracranial process  continue treatment plans as below Sepsis secondary to acute pyelonephritis Temp 101, heart rate 106, respiratory rate 27, leukocytosis 25.5, lactic acid greater than 2, CT scan is suspicious for pyelonephritis Continue broad-spectrum antibiotics as there is still some concern that the pyelonephritis on CT scan is not entirely convincing Respiratory panel negative Blood  culture pending Chest x-ray shows no acute cardiopulmonary findings Continue to monitor Hypokalemia Replace and recheck Opiate dependence Patient takes large amounts of Dilaudid and oxycodone at home Responded to Narcan in the field CIWA protocol, Precedex drip for agitation Small doses of morphine for pain Narcan has worn off Continue to monitor    DVT Prophylaxis-   Heparin- SCDs   AM Labs Ordered, also please review Full Orders  Family Communication: No family at bedside Code Status: Full  Admission status:Inpatient :The appropriate admission status for this patient is INPATIENT. Inpatient status is judged to be reasonable and necessary in order to provide the required intensity of service to ensure the patient's safety. The patient's presenting symptoms, physical exam findings, and initial radiographic and laboratory data in the context of their chronic comorbidities is felt to place them at high risk for further clinical deterioration. Furthermore, it is not anticipated that the patient will be medically stable for discharge from the hospital within 2 midnights of admission. The following factors support the admission status of inpatient.     The patient's presenting symptoms include altered mental status. The worrisome physical exam findings include agitation after Narcan.,  Fever The initial radiographic and laboratory data are worrisome because of pyelonephritis. The chronic co-morbidities include chronic pain, opiate dependence, GERD, insomnia.       * I certify that at the point of admission it is my clinical judgment that the patient will require inpatient hospital care spanning beyond 2 midnights from the point of admission due to high intensity of service, high risk for further deterioration and high frequency of surveillance required.*  Time spent in minutes : [place]

## 2020-12-06 NOTE — Progress Notes (Addendum)
Initial Nutrition Assessment  DOCUMENTATION CODES:   Not applicable  INTERVENTION:  Ensure Enlive po BID, each supplement provides 350 kcal and 20 grams of protein   If patient does not become alert for oral intake within 24-48 hr recommend NGT placement and initiate enteral feeding.   NUTRITION DIAGNOSIS:   Inadequate oral intake related to acute illness (unresponsive) as evidenced by  (patient last seen well on 9/17).   GOAL:  Provide needs based on ASPEN/SCCM guidelines   MONITOR:  Labs, I & O's, PO intake, Weight trends  REASON FOR ASSESSMENT:   Malnutrition Screening Tool    ASSESSMENT: Patient is a 55 yo male who was found unresponsive at home. History includes- chronic back pain, Open wound lumbar region, GERD, arthritis. Combative and required soft restraints.   Patient not alert enough for oral intake. His lunch is here at bedside and pt is requiring sedation. Last seen well on 9/17 per nursing. Unclear when he last ate.    Weight history reviewed.   Medications: Marinol, Folvite, MVI and zinc. Precedex infusion.  NUTRITION - FOCUSED PHYSICAL EXAM:  Flowsheet Row Most Recent Value  Orbital Region No depletion  Upper Arm Region No depletion  Thoracic and Lumbar Region No depletion  Buccal Region No depletion  Temple Region Mild depletion  Clavicle Bone Region Mild depletion  Scapular Bone Region Unable to assess  Dorsal Hand No depletion  Patellar Region No depletion  Anterior Thigh Region No depletion  Posterior Calf Region No depletion  Edema (RD Assessment) None  Hair Reviewed  Eyes Unable to assess  Mouth Unable to assess  Skin Reviewed  Nails Reviewed      Diet Order:   Diet Order             Diet Heart Room service appropriate? Yes; Fluid consistency: Thin  Diet effective now                   EDUCATION NEEDS:  Not appropriate for education at this time  Skin:  Skin Assessment: Reviewed RN Assessment  (open wound to lumbar )  per PMH  Last BM:  unknown  Height:   Ht Readings from Last 1 Encounters:  12/05/20 5\' 10"  (1.778 m)    Weight:   Wt Readings from Last 1 Encounters:  12/06/20 76.4 kg    Ideal Body Weight:   75 kg  BMI:  Body mass index is 24.17 kg/m.  Estimated Nutritional Needs:   Kcal:  2100-2300  Protein:  106-114 gr  Fluid:  >2.1 liters daily   12/08/20 MS,RD,CSG,LDN Contact: Royann Shivers

## 2020-12-07 DIAGNOSIS — G9341 Metabolic encephalopathy: Secondary | ICD-10-CM

## 2020-12-07 LAB — BASIC METABOLIC PANEL
Anion gap: 6 (ref 5–15)
BUN: 10 mg/dL (ref 6–20)
CO2: 24 mmol/L (ref 22–32)
Calcium: 7.9 mg/dL — ABNORMAL LOW (ref 8.9–10.3)
Chloride: 105 mmol/L (ref 98–111)
Creatinine, Ser: 0.91 mg/dL (ref 0.61–1.24)
GFR, Estimated: >60 mL/min (ref >60)
Glucose, Bld: 91 mg/dL (ref 70–99)
Potassium: 3.4 mmol/L — ABNORMAL LOW (ref 3.5–5.1)
Sodium: 135 mmol/L (ref 135–145)

## 2020-12-07 LAB — CBC
HCT: 42.7 % (ref 39.0–52.0)
Hemoglobin: 14 g/dL (ref 13.0–17.0)
MCH: 32.6 pg (ref 26.0–34.0)
MCHC: 32.8 g/dL (ref 30.0–36.0)
MCV: 99.5 fL (ref 80.0–100.0)
Platelets: 149 10*3/uL — ABNORMAL LOW (ref 150–400)
RBC: 4.29 MIL/uL (ref 4.22–5.81)
RDW: 13.9 % (ref 11.5–15.5)
WBC: 11.3 10*3/uL — ABNORMAL HIGH (ref 4.0–10.5)
nRBC: 0 % (ref 0.0–0.2)

## 2020-12-07 LAB — BRAIN NATRIURETIC PEPTIDE: B Natriuretic Peptide: 265 pg/mL — ABNORMAL HIGH (ref 0.0–100.0)

## 2020-12-07 LAB — MAGNESIUM: Magnesium: 1.9 mg/dL (ref 1.7–2.4)

## 2020-12-07 LAB — URINE CULTURE: Culture: NO GROWTH

## 2020-12-07 LAB — CK: Total CK: 1534 U/L — ABNORMAL HIGH (ref 49–397)

## 2020-12-07 MED ORDER — PANTOPRAZOLE SODIUM 40 MG PO TBEC
40.0000 mg | DELAYED_RELEASE_TABLET | Freq: Every day | ORAL | Status: DC
  Administered 2020-12-07 – 2020-12-12 (×6): 40 mg via ORAL
  Filled 2020-12-07 (×6): qty 1

## 2020-12-07 MED ORDER — CLONIDINE HCL 0.2 MG PO TABS
0.2000 mg | ORAL_TABLET | Freq: Two times a day (BID) | ORAL | Status: DC
  Administered 2020-12-07 – 2020-12-12 (×11): 0.2 mg via ORAL
  Filled 2020-12-07 (×10): qty 1

## 2020-12-07 MED ORDER — SIMETHICONE 80 MG PO CHEW: 80.0000 mg | CHEWABLE_TABLET | Freq: Four times a day (QID) | ORAL | Status: DC | PRN

## 2020-12-07 MED ORDER — DEXTROSE-NACL 5-0.45 % IV SOLN: INTRAVENOUS | Status: DC

## 2020-12-07 MED ORDER — LORAZEPAM 0.5 MG PO TABS
0.5000 mg | ORAL_TABLET | Freq: Two times a day (BID) | ORAL | Status: DC | PRN
  Administered 2020-12-08: 0.5 mg via ORAL
  Filled 2020-12-07: qty 1

## 2020-12-07 MED ORDER — SERTRALINE HCL 50 MG PO TABS
100.0000 mg | ORAL_TABLET | Freq: Every day | ORAL | Status: DC
  Administered 2020-12-07 – 2020-12-11 (×5): 100 mg via ORAL
  Filled 2020-12-07 (×5): qty 2

## 2020-12-07 MED ORDER — OMEPRAZOLE MAGNESIUM 20 MG PO TBEC: 20.0000 mg | DELAYED_RELEASE_TABLET | ORAL | Status: DC

## 2020-12-07 MED ORDER — AMOXICILLIN-POT CLAVULANATE 875-125 MG PO TABS
1.0000 | ORAL_TABLET | Freq: Two times a day (BID) | ORAL | Status: DC
  Administered 2020-12-07 – 2020-12-12 (×11): 1 via ORAL
  Filled 2020-12-07 (×11): qty 1

## 2020-12-07 NOTE — Evaluation (Signed)
Physical Therapy Evaluation Patient Details Name: Jared Li MRN: [id number] DOB: [birth year] Today's Date: 12/07/2020  History of Present Illness  Jared Li  is a 55 y.o. male, with history of adjustment disorder, cardiopulmonary syndrome, cervical radiculopathy, chronic intractable headache, GERD, lumbar postlaminectomy syndrome, tobacco use disorder, and more presents ED with a chief complaint of altered mental status.  Apparently patient brother found him and called 911.  Patient received intranasal Narcan, and then became very agitated.  Patient is on chronic large amounts of Dilaudid and oxycodone at home for chronic back pain.  Patient would not provide any history to the ED provider, but was moving all of his extremities due to pain.  07 (he was too agitated to lay still for it so he received a couple doses of Ativan.  The time I saw him patient is very somnolent most likely secondary to Ativan.  Patient would not provide me with any history either.   Clinical Impression  Patient demonstrates good return for bed mobility, other than occasional leaning forward requiring verbal/tactile cueing for safety, unable to maintain standing balance attempting to use a SPC, had to use RW for safety, limited to a few steps in room with frequent standing rest beaks and easily distracted requiring redirection to tasks.  Patient tolerated staying up in chair after therapy with his family members present in room.  Patient will benefit from continued physical therapy in hospital and recommended venue below to increase strength, balance, endurance for safe ADLs and gait. Patient will benefit from continued physical therapy in hospital and recommended venue below to increase strength, balance, endurance for safe ADLs and gait.       Recommendations for follow up therapy are one component of a multi-disciplinary discharge planning process, led by the attending physician.  Recommendations may be updated  based on patient status, additional functional criteria and insurance authorization.  Follow Up Recommendations Home health PT;Supervision for mobility/OOB;Supervision - Intermittent    Equipment Recommendations  None recommended by PT    Recommendations for Other Services       Precautions / Restrictions Precautions Precautions: Fall Restrictions Weight Bearing Restrictions: No      Mobility  Bed Mobility Overal bed mobility: Needs Assistance Bed Mobility: Supine to Sit;Sit to Supine     Supine to sit: Supervision Sit to supine: Supervision   General bed mobility comments: increased time, labored movement    Transfers Overall transfer level: Needs assistance Equipment used: Rolling walker (2 wheeled) Transfers: Sit to/from [employer] Sit to Stand: Min guard;Min assist Stand pivot transfers: Min assist       General transfer comment: unsteady on feet with hand slipping off SPC, required use of RW for safety  Ambulation/Gait Ambulation/Gait assistance: Min guard;Min assist Gait Distance (Feet): 10 Feet Assistive device: Rolling walker (2 wheeled) Gait Pattern/deviations: Decreased step length - right;Decreased step length - left;Decreased stride length;Trunk flexed Gait velocity: decreased   General Gait Details: demonstrates slow labored steps with frequent standing rest breaks, has to lean on RW for support due to BLE weakness, limited mostly due to c/o fatigue  Stairs            Wheelchair Mobility    Modified Rankin (Stroke Patients Only)       Balance Overall balance assessment: Needs assistance Sitting-balance support: Feet supported;No upper extremity supported Sitting balance-Leahy Scale: Fair Sitting balance - Comments: seated at EOB with frequent leaning forward   Standing balance support: During functional activity;Single extremity supported Standing balance-Leahy  Scale: Poor Standing balance comment: fair/poor using  RW                             Pertinent Vitals/Pain Pain Assessment: Faces Faces Pain Scale: Hurts even more Pain Location: low back with movement Pain Descriptors / Indicators: Sore;Aching;Grimacing Pain Intervention(s): Limited activity within patient's tolerance;Monitored during session;Repositioned    Home Living Family/patient expects to be discharged to:: Private residence Living Arrangements: Alone Available Help at Discharge: Family;Available 24 hours/day Type of Home: Mobile home Home Access: Stairs to enter Entrance Stairs-Rails: Right;Left;Can reach both Entrance Stairs-Number of Steps: 2 Home Layout: One level Home Equipment: Walker - 2 wheels;Crutches;Other (comment) Additional Comments: forearm crutches    Prior Function Level of Independence: Independent with assistive device(s);Needs assistance   Gait / Transfers Assistance Needed: household ambulator using loft strand crutches most of time, has SPC, RW, does not drive  ADL's / Homemaking Assistance Needed: assisted by family        Hand Dominance   Dominant Hand: Right    Extremity/Trunk Assessment   Upper Extremity Assessment Upper Extremity Assessment: Generalized weakness    Lower Extremity Assessment Lower Extremity Assessment: Generalized weakness    Cervical / Trunk Assessment Cervical / Trunk Assessment: Kyphotic  Communication   Communication: No difficulties  Cognition Arousal/Alertness: Awake/alert Behavior During Therapy: WFL for tasks assessed/performed Overall Cognitive Status: Within Functional Limits for tasks assessed                                        General Comments      Exercises     Assessment/Plan    PT Assessment Patient needs continued PT services  PT Problem List Decreased strength;Decreased activity tolerance;Decreased balance;Decreased mobility       PT Treatment Interventions DME instruction;Gait training;Stair  training;Functional mobility training;Therapeutic activities;Therapeutic exercise;Patient/family education;Balance training    PT Goals (Current goals can be found in the Care Plan section)  Acute Rehab PT Goals Patient Stated Goal: return home with family to assist PT Goal Formulation: With patient Time For Goal Achievement: 12/12/20 Potential to Achieve Goals: Good    Frequency Min 3X/week   Barriers to discharge        Co-evaluation               AM-PAC PT "6 Clicks" Mobility  Outcome Measure Help needed turning from your back to your side while in a flat bed without using bedrails?: None Help needed moving from lying on your back to sitting on the side of a flat bed without using bedrails?: A Little Help needed moving to and from a bed to a chair (including a wheelchair)?: A Little Help needed standing up from a chair using your arms (e.g., wheelchair or bedside chair)?: A Little Help needed to walk in hospital room?: A Lot Help needed climbing 3-5 steps with a railing? : A Lot 6 Click Score: 17    End of Session   Activity Tolerance: Patient tolerated treatment well;Patient limited by fatigue Patient left: in chair;with call bell/phone within reach;with family/visitor present Nurse Communication: Mobility status PT Visit Diagnosis: Unsteadiness on feet (R26.81);Other abnormalities of gait and mobility (R26.89);Muscle weakness (generalized) (M62.81)    Time: 1340-1406 PT Time Calculation (min) (ACUTE ONLY): 26 min   Charges:   PT Evaluation $PT Eval Moderate Complexity: 1 Mod PT Treatments $Therapeutic Activity:  23-37 mins        2:46 PM, 12/07/20 Ocie Bob, MPT Physical Therapist with [facility] [phone] office [phone] mobile phone

## 2020-12-07 NOTE — Plan of Care (Signed)
  Problem: Acute Rehab PT Goals(only PT should resolve) Goal: Pt Will Go Supine/Side To Sit Outcome: Progressing Flowsheets (Taken 12/07/2020 1449) Pt will go Supine/Side to Sit: with modified independence Goal: Patient Will Transfer Sit To/From Stand Outcome: Progressing Flowsheets (Taken 12/07/2020 1449) Patient will transfer sit to/from stand: with supervision Goal: Pt Will Transfer Bed To Chair/Chair To Bed Outcome: Progressing Flowsheets (Taken 12/07/2020 1449) Pt will Transfer Bed to Chair/Chair to Bed: with supervision Goal: Pt Will Ambulate Outcome: Progressing Flowsheets (Taken 12/07/2020 1449) Pt will Ambulate:  50 feet  with min guard assist  with rolling walker Note: Forearm crutches (if available)   2:50 PM, 12/07/20 Ocie Bob, MPT Physical Therapist with [facility] [phone] office [phone] mobile phone

## 2020-12-07 NOTE — Progress Notes (Signed)
Patient assisted to bathroom by NT and after sitting fell forward at 2025. NT did not see fall but was close by in room, patient denies any pain or hitting his head. Notified charge nurse Linwood Dibbles, [facility] Wendi Maya and Dr. Thomes Dinning to make aware. Patient did not want to me notify family at this time, states he was "Ok, just embarrassed." No new orders given, closely monitor.   12/07/20 2043  What Happened  Was fall witnessed? No  Patients activity before fall bathroom-assisted  Was patient injured? No  Patient found in bathroom  Found by Staff-comment Cletis [place])  Stated prior activity other (comment) (sitting on toilet after being assisted to bathroom)  Follow Up  MD notified Dr. Thomes Dinning  Time MD notified 2045  Family notified No - patient refusal  Adult Fall Risk Assessment  Risk Factor Category (scoring not indicated) High fall risk per protocol (document High fall risk)  Patient Fall Risk Level High fall risk  Adult Fall Risk Interventions  Required Bundle Interventions *See Row Information* High fall risk - low, moderate, and high requirements implemented  Additional Interventions Use of appropriate toileting equipment (bedpan, BSC, etc.)  Screening for Fall Injury Risk (To be completed on HIGH fall risk patients) - Assessing Need for Floor Mats  Risk For Fall Injury- Criteria for Floor Mats Confusion/dementia (+NuDESC, CIWA, TBI, etc.)  Vitals  Temp 98.1 F (36.7 C)  Temp Source Oral  BP (!) 143/94  MAP (mmHg) 108  BP Location Left Arm  BP Method Automatic  Patient Position (if appropriate) Lying  Pulse Rate 93  Pulse Rate Source Monitor  Resp (!) 21  Oxygen Therapy  SpO2 98 %  O2 Device Room Air  Pain Assessment  Pain Scale 0-10  Pain Score 0

## 2020-12-07 NOTE — Progress Notes (Signed)
PROGRESS NOTE    Jared Li  JKK:[id number] DOB: 11-21-1965 DOA: 12/05/2020 PCP: Benita Stabile, MD   Brief Narrative:  55 year old with history of adjustment disorder, cardiopulmonary syndrome, cervical radiculopathy, chronic intractable pain, GERD, cauda equina syndrome lumbar postlaminectomy, tobacco use presents to the ED with altered mental status.  Initially patient was very agitated, typically uses large amounts of Dilaudid and oxycodone for chronic back pain.  Upon admission she was febrile, tachycardic and hypertensive.  COVID-19 negative.  Chest x-ray unremarkable, UDS positive for marijuana and opioids.  CT head did not show any acute pathology but showed multifocal dental periapical lucencies.  CT abdomen pelvis was questionable for bilateral pyelonephritis.  Patient was admitted for agitation, sepsis.  Initially patient started on Precedex drip which was weaned off over 24 hours as his mentation was improving.  His sepsis physiology was improving and he was tolerating orals therefore antibiotics were changed to oral Augmentin.   Assessment & Plan:   Active Problems:   Sepsis (HCC)   Acute pyelonephritis   Hypokalemia   Opiate dependence (HCC)   Acute metabolic encephalopathy, improved - Multifactorial.  Withdrawal from opioids, underlying infection -CT head negative, shows periapical dental lucencies -EEG shows generalized slowing but no seizure activity - Supportive care neurochecks today.  Weaned off Precedex drip Attempt to dc his restraints.   Sepsis secondary to acute pyelonephritis - Improving, follow-up culture data. - Antibiotics- Vanc & Cefepime, transition to oral Augmentin for 6 more days - Procalcitonin 0.89  Rhabdomyolysis - Increase the rate of IV fluids  Hypokalemia - Replete as necessary  Chronic pain, opiate dependence - At home he is on hydromorphone 60 mg twice daily, Roxicodone 15 mg every 4 hours as needed. - Now he is off  Precedex  GERD - PPI at home.  History of adjustment disorder - On Zoloft at home  Tobacco use - As needed bronchodilators  Essential hypertension, uncontrolled - Increase clonidine to 0.2 mg twice daily.  IV hydralazine as needed    DVT prophylaxis: SQ heparin Code Status: Full Family Communication: Brother is present at bedside  Status is: Inpatient  Remains inpatient appropriate because:Inpatient level of care appropriate due to severity of illness.    Dispo: The patient is from: Home              Anticipated d/c is to: Home              Patient currently is not medically stable to d/c.   Difficult to place patient No        Subjective: Patient is awake this morning and responding appropriately but his blood pressure remains elevated.  His brother is present at bedside and tells me he looks much better.  ROS:   General: Denies fever, chills, night sweats or unintended weight loss. Resp: Denies cough, wheezing, shortness of breath. Cardiac: Denies chest pain, palpitations, orthopnea, paroxysmal nocturnal dyspnea. GI: Denies abdominal pain, nausea, vomiting, diarrhea or constipation GU: Denies dysuria, frequency, hesitancy or incontinence MS: Denies muscle aches, joint pain or swelling Neuro: Denies headache, neurologic deficits (focal weakness, numbness, tingling), abnormal gait Psych: Denies anxiety, depression, SI/HI/AVH Skin: Denies new rashes or lesions ID: Denies sick contacts, exotic exposures, travel  Physical exam: Constitutional: Not in acute distress Respiratory: Clear to auscultation bilaterally Cardiovascular: Normal sinus rhythm, no rubs Abdomen: Nontender nondistended good bowel sounds Musculoskeletal: No edema noted Skin: No rashes seen Neurologic: CN 2-12 grossly intact.  And nonfocal Psychiatric: Normal judgment and insight. Alert and  oriented x 3. Normal mood.  Objective: Vitals:   12/07/20 0700 12/07/20 0800 12/07/20 0923 12/07/20  1100  BP: (!) 170/91 (!) 182/117 (!) 165/110   Pulse: 82 92    Resp: (!) 25 (!) 23    Temp: 98.7 F (37.1 C)   98.6 F (37 C)  TempSrc: Axillary   Oral  SpO2: 92% 96%    Weight:      Height:        Intake/Output Summary (Last 24 hours) at 12/07/2020 1141 Last data filed at 12/07/2020 0500 Gross per 24 hour  Intake 4199.21 ml  Output 1050 ml  Net 3149.21 ml   Filed Weights   12/05/20 1659 12/06/20 0315  Weight: 95.3 kg 76.4 kg     Data Reviewed:   CBC: Recent Labs  Lab 12/05/20 1748 12/06/20 0422 12/07/20 0442  WBC 25.5* 19.7* 11.3*  NEUTROABS 20.8* 15.1*  --   HGB 19.1* 17.3* 14.0  HCT 55.0* 50.6 42.7  MCV 96.3 97.3 99.5  PLT 256 198 149*   Basic Metabolic Panel: Recent Labs  Lab 12/05/20 1748 12/06/20 0422 12/07/20 0442  NA 136 137 135  K 3.2* 3.2* 3.4*  CL 101 103 105  CO2 22 23 24   GLUCOSE 128* 116* 91  BUN 10 9 10   CREATININE 1.21 0.91 0.91  CALCIUM 9.3 8.6* 7.9*  MG  --  2.1 1.9   GFR: Estimated Creatinine Clearance: 94.7 mL/min (by C-G formula based on SCr of 0.91 mg/dL). Liver Function Tests: Recent Labs  Lab 12/05/20 1748 12/06/20 0422  AST 32 34  ALT 17 17  ALKPHOS 178* 140*  BILITOT 0.8 0.8  PROT 9.1* 7.5  ALBUMIN 4.9 3.9   No results for input(s): LIPASE, AMYLASE in the last 168 hours. No results for input(s): AMMONIA in the last 168 hours. Coagulation Profile: Recent Labs  Lab 12/06/20 0422  INR 1.3*   Cardiac Enzymes: Recent Labs  Lab 12/05/20 1748 12/06/20 0422 12/07/20 0442  CKTOTAL 625* 970* 1,534*   BNP (last 3 results) No results for input(s): PROBNP in the last 8760 hours. HbA1C: No results for input(s): HGBA1C in the last 72 hours. CBG: No results for input(s): GLUCAP in the last 168 hours. Lipid Profile: No results for input(s): CHOL, HDL, LDLCALC, TRIG, CHOLHDL, LDLDIRECT in the last 72 hours. Thyroid Function Tests: Recent Labs    12/06/20 0906  TSH 2.142   Anemia Panel: Recent Labs     12/06/20 0906  VITAMINB12 264   Sepsis Labs: Recent Labs  Lab 12/05/20 2006 12/05/20 2214 12/06/20 0101 12/06/20 0422 12/06/20 0704  PROCALCITON  --   --   --  0.89  --   LATICACIDVEN 2.3* 2.0* 2.2*  --  1.6    Recent Results (from the past 240 hour(s))  Urine Culture     Status: None   Collection Time: 12/05/20  7:23 PM   Specimen: In/Out Cath Urine  Result Value Ref Range Status   Specimen Description   Final    IN/OUT CATH URINE Performed at [facility], [address] [place], [address] [place]    Special Requests   Final    NONE Performed at [facility], [address], [place], [address] [place]    Culture   Final    NO GROWTH Performed at [facility], [address] [place], [address] [place]    Report Status 12/07/2020 FINAL  Final  Culture, blood (routine x 2)  Status: None (Preliminary result)   Collection Time: 12/05/20  8:06 PM   Specimen: BLOOD RIGHT HAND  Result Value Ref Range Status   Specimen Description BLOOD RIGHT HAND  Final   Special Requests   Final    Blood Culture adequate volume BOTTLES DRAWN AEROBIC AND ANAEROBIC   Culture   Final    NO GROWTH 2 DAYS Performed at [facility], [address] [place], [place] [address]    Report Status PENDING  Incomplete  Culture, blood (routine x 2)     Status: None (Preliminary result)   Collection Time: 12/05/20  8:06 PM   Specimen: BLOOD LEFT FOREARM  Result Value Ref Range Status   Specimen Description BLOOD LEFT FOREARM  Final   Special Requests   Final    Blood Culture results may not be optimal due to an excessive volume of blood received in culture bottles BOTTLES DRAWN AEROBIC AND ANAEROBIC   Culture   Final    NO GROWTH 2 DAYS Performed at [facility], [address], [place], [place] [address]    Report Status PENDING  Incomplete  Resp Panel by RT-PCR (Flu A&B, Covid) Nasopharyngeal Swab     Status: None   Collection Time: 12/05/20  8:13 PM   Specimen: Nasopharyngeal Swab;  Nasopharyngeal(NP) swabs in vial transport medium  Result Value Ref Range Status   SARS Coronavirus 2 by RT PCR NEGATIVE NEGATIVE Final    Comment: (NOTE) SARS-CoV-2 target nucleic acids are NOT DETECTED.  The SARS-CoV-2 RNA is generally detectable in upper respiratory specimens during the acute phase of infection. The lowest concentration of SARS-CoV-2 viral copies this assay can detect is 138 copies/mL. A negative result does not preclude SARS-Cov-2 infection and should not be used as the sole basis for treatment or other patient management decisions. A negative result may occur with  improper specimen collection/handling, submission of specimen other than nasopharyngeal swab, presence of viral mutation(s) within the areas targeted by this assay, and inadequate number of viral copies(<138 copies/mL). A negative result must be combined with clinical observations, patient history, and epidemiological information. The expected result is Negative.  Fact Sheet for Patients:  BloggerCourse.com  Fact Sheet for Healthcare Providers:  SeriousBroker.it  This test is no t yet approved or cleared by the [place] FDA and  has been authorized for detection and/or diagnosis of SARS-CoV-2 by FDA under an Emergency Use Authorization (EUA). This EUA will remain  in effect (meaning this test can be used) for the duration of the COVID-19 declaration under Section 564(b)(1) of the Act, 21 U.S.C.section 360bbb-3(b)(1), unless the authorization is terminated  or revoked sooner.       Influenza A by PCR NEGATIVE NEGATIVE Final   Influenza B by PCR NEGATIVE NEGATIVE Final    Comment: (NOTE) The Xpert Xpress SARS-CoV-2/FLU/RSV plus assay is intended as an aid in the diagnosis of influenza from Nasopharyngeal swab specimens and should not be used as a sole basis for treatment. Nasal washings and aspirates are unacceptable for Xpert Xpress  SARS-CoV-2/FLU/RSV testing.  Fact Sheet for Patients: BloggerCourse.com  Fact Sheet for Healthcare Providers: SeriousBroker.it  This test is not yet approved or cleared by the [place] FDA and has been authorized for detection and/or diagnosis of SARS-CoV-2 by FDA under an Emergency Use Authorization (EUA). This EUA will remain in effect (meaning this test can be used) for the duration of the COVID-19 declaration under Section 564(b)(1) of the Act, 21 U.S.C. section 360bbb-3(b)(1), unless the authorization  is terminated or revoked.  Performed at [facility], [address], [place], [place] [address]   MRSA Next Gen by PCR, Nasal     Status: None   Collection Time: 12/06/20  2:46 AM   Specimen: Nasal Mucosa; Nasal Swab  Result Value Ref Range Status   MRSA by PCR Next Gen NOT DETECTED NOT DETECTED Final    Comment: (NOTE) The GeneXpert MRSA Assay (FDA approved for NASAL specimens only), is one component of a comprehensive MRSA colonization surveillance program. It is not intended to diagnose MRSA infection nor to guide or monitor treatment for MRSA infections. Test performance is not FDA approved in patients less than 42 years old. Performed at [facility], [address] [place], [place] [address]          Radiology Studies: CT HEAD WO CONTRAST ( )  Result Date: 12/05/2020 CLINICAL DATA:  Delirium EXAM: CT HEAD WITHOUT CONTRAST CT MAXILLOFACIAL WITHOUT CONTRAST TECHNIQUE: Multidetector CT imaging of the head and maxillofacial structures were performed using the standard protocol without intravenous contrast. Multiplanar CT image reconstructions of the maxillofacial structures were also generated. COMPARISON:  None. FINDINGS: Motion degraded examination. Technologist reported patient combative during the exam. CT HEAD FINDINGS Brain: No evidence of acute infarction, hemorrhage, hydrocephalus, extra-axial collection or  mass lesion/mass effect. Vascular: No hyperdense vessel or unexpected calcification. Skull: Normal. Negative for fracture or focal lesion. Other: None. CT MAXILLOFACIAL FINDINGS Osseous: No acute maxillofacial bone fracture. Bony orbital walls are intact. Mandible intact. Temporomandibular joints are aligned without dislocation. Multifocal dental periapical lucencies. Orbits: Negative. No traumatic or inflammatory finding. Sinuses: Clear. Soft tissues: No soft tissue swelling or hematoma. IMPRESSION: 1. This limitation there is motion degraded examination. Within no evidence of an acute intracranial process. 2. No acute maxillofacial bone fracture. 3. Multifocal dental periapical lucencies. Electronically Signed   By: Duanne Guess D.O.   On: 12/05/2020 18:11   CT ABDOMEN PELVIS W CONTRAST  Result Date: 12/05/2020 CLINICAL DATA:  Abdominal pain with fever. EXAM: CT ABDOMEN AND PELVIS WITH CONTRAST TECHNIQUE: Multidetector CT imaging of the abdomen and pelvis was performed using the standard protocol following bolus administration of intravenous contrast. CONTRAST:  45mL OMNIPAQUE IOHEXOL 350 MG/ML SOLN COMPARISON:  None. FINDINGS: Lower chest: No acute abnormality. Hepatobiliary: No focal liver abnormality is seen. No gallstones, gallbladder wall thickening, or biliary dilatation. Pancreas: Unremarkable. No pancreatic ductal dilatation or surrounding inflammatory changes. Spleen: Normal in size without focal abnormality. Adrenals/Urinary Tract: There is limited evaluation secondary to motion artifact. There are questionable areas of subtle patchy hypodensity throughout the bilateral renal parenchyma, although this may be related to streak artifact. Kidneys are normal in size and contour. There is no hydronephrosis or perinephric fluid. The bladder is within normal limits. Stomach/Bowel: Stomach is within normal limits. Appendix appears normal. No evidence of bowel wall thickening, distention, or inflammatory  changes. Vascular/Lymphatic: Aortic atherosclerosis. No enlarged abdominal or pelvic lymph nodes. Reproductive: Not well evaluated secondary to motion artifact. Other: No abdominal wall hernia or abnormality. No abdominopelvic ascites. Musculoskeletal: L4-S1 posterior fusion hardware is present and appears uncomplicated. No acute fractures are seen. IMPRESSION: 1. Questionable patchy areas of hypodensity throughout the bilateral renal parenchyma which would be concerning for pyelonephritis. Note is made that there is artifact in this region. Please correlate clinically. No hydronephrosis or perinephric fluid collection. 2.  Aortic Atherosclerosis (ICD10-I70.0). Electronically Signed   By: Darliss Cheney M.D.   On: 12/05/2020 23:40   DG Chest Portable 1 View  Result  Date: 12/05/2020 CLINICAL DATA:  Altered mental status. EXAM: PORTABLE CHEST 1 VIEW COMPARISON:  06/04/2016 FINDINGS: The cardiac silhouette, mediastinal and hilar contours are within normal limits. The lungs are clear of an acute process. No infiltrates, edema or effusions. No pulmonary lesions. The bony thorax is intact. IMPRESSION: No acute cardiopulmonary findings. Electronically Signed   By: Rudie Meyer M.D.   On: 12/05/2020 20:03   EEG adult  Result Date: 12/06/2020 Jefferson Fuel, MD     12/06/2020  5:40 PM Routine EEG Report Tiny Rietz is a 55 y.o. male with a history of altered mental status who is undergoing an EEG to evaluate for seizures. Report: This EEG was acquired with electrodes placed according to the International 10-20 electrode system (including Fp1, Fp2, F3, F4, C3, C4, P3, P4, O1, O2, T3, T4, T5, T6, A1, A2, Fz, Cz, Pz). The following electrodes were missing or displaced: none. There was no waking rhythm observed. The best background was 6-7 Hz with intermittent superimposed more pronounced diffuse slowing. This activity is reactive to stimulation. Sleep was manifested as K complexes and sleep spindles. There was no  focal slowing. There were no interictal epileptiform discharges. There were no electrographic seizures identified. Photic stimulation and hyperventilation were not performed. Impression and clinical correlation: This EEG was obtained while sedated with ativan and asleep and is abnormal due to moderate diffuse slowing, indicative of global cerebral dysfunction. Bing Neighbors, MD [facility] [phone] If 7pm- 7am, please page neurology on call as listed in AMION.   CT Maxillofacial Wo Contrast  Result Date: 12/05/2020 CLINICAL DATA:  Delirium EXAM: CT HEAD WITHOUT CONTRAST CT MAXILLOFACIAL WITHOUT CONTRAST TECHNIQUE: Multidetector CT imaging of the head and maxillofacial structures were performed using the standard protocol without intravenous contrast. Multiplanar CT image reconstructions of the maxillofacial structures were also generated. COMPARISON:  None. FINDINGS: Motion degraded examination. Technologist reported patient combative during the exam. CT HEAD FINDINGS Brain: No evidence of acute infarction, hemorrhage, hydrocephalus, extra-axial collection or mass lesion/mass effect. Vascular: No hyperdense vessel or unexpected calcification. Skull: Normal. Negative for fracture or focal lesion. Other: None. CT MAXILLOFACIAL FINDINGS Osseous: No acute maxillofacial bone fracture. Bony orbital walls are intact. Mandible intact. Temporomandibular joints are aligned without dislocation. Multifocal dental periapical lucencies. Orbits: Negative. No traumatic or inflammatory finding. Sinuses: Clear. Soft tissues: No soft tissue swelling or hematoma. IMPRESSION: 1. This limitation there is motion degraded examination. Within no evidence of an acute intracranial process. 2. No acute maxillofacial bone fracture. 3. Multifocal dental periapical lucencies. Electronically Signed   By: Duanne Guess D.O.   On: 12/05/2020 18:11        Scheduled Meds:  amoxicillin-clavulanate  1 tablet Oral Q12H    vitamin C  500 mg Oral BID   Chlorhexidine Gluconate Cloth  6 each Topical Daily   cloNIDine  0.2 mg Oral BID   dronabinol  5 mg Oral BID AC   feeding supplement   Oral QID   folic acid  1 mg Oral Daily   heparin  5,000 Units Subcutaneous Q8H   imipramine  10 mg Oral QHS   LORazepam  0-4 mg Intravenous Q6H   Followed by   Melene Muller ON 12/08/2020] LORazepam  0-4 mg Intravenous Q12H   multivitamin with minerals  1 tablet Oral Daily   thiamine  100 mg Oral Daily   Or   thiamine  100 mg Intravenous Daily   zinc sulfate  220 mg Oral Daily   Continuous Infusions:  dexmedetomidine (  PRECEDEX) IV infusion Stopped (12/06/20 1900)   dextrose 5 % and 0.45% NaCl 125 mL/hr at 12/07/20 0932     LOS: 1 day   Time spent= 35 mins    Lorina Duffner Joline Maxcy, MD [facility]  If 7PM-7AM, please contact night-coverage  12/07/2020, 11:41 AM

## 2020-12-07 NOTE — Progress Notes (Signed)
Son called for update on pt status.  Jared Li- In [employer], [employer]). Contact number [phone]

## 2020-12-08 DIAGNOSIS — M6282 Rhabdomyolysis: Secondary | ICD-10-CM

## 2020-12-08 LAB — CBC
HCT: 42.1 % (ref 39.0–52.0)
Hemoglobin: 13.8 g/dL (ref 13.0–17.0)
MCH: 32.4 pg (ref 26.0–34.0)
MCHC: 32.8 g/dL (ref 30.0–36.0)
MCV: 98.8 fL (ref 80.0–100.0)
Platelets: 155 10*3/uL (ref 150–400)
RBC: 4.26 MIL/uL (ref 4.22–5.81)
RDW: 13.6 % (ref 11.5–15.5)
WBC: 8.2 10*3/uL (ref 4.0–10.5)
nRBC: 0 % (ref 0.0–0.2)

## 2020-12-08 LAB — BASIC METABOLIC PANEL
Anion gap: 8 (ref 5–15)
BUN: 5 mg/dL — ABNORMAL LOW (ref 6–20)
CO2: 26 mmol/L (ref 22–32)
Calcium: 8.4 mg/dL — ABNORMAL LOW (ref 8.9–10.3)
Chloride: 104 mmol/L (ref 98–111)
Creatinine, Ser: 0.89 mg/dL (ref 0.61–1.24)
GFR, Estimated: >60 mL/min (ref >60)
Glucose, Bld: 103 mg/dL — ABNORMAL HIGH (ref 70–99)
Potassium: 2.9 mmol/L — ABNORMAL LOW (ref 3.5–5.1)
Sodium: 138 mmol/L (ref 135–145)

## 2020-12-08 LAB — PHOSPHORUS: Phosphorus: 2.5 mg/dL (ref 2.5–4.6)

## 2020-12-08 LAB — CK: Total CK: 4888 U/L — ABNORMAL HIGH (ref 49–397)

## 2020-12-08 LAB — MAGNESIUM: Magnesium: 1.8 mg/dL (ref 1.7–2.4)

## 2020-12-08 MED ORDER — POTASSIUM CHLORIDE 10 MEQ/100ML IV SOLN
10.0000 meq | INTRAVENOUS | Status: AC
  Administered 2020-12-08 (×4): 10 meq via INTRAVENOUS
  Filled 2020-12-08 (×4): qty 100

## 2020-12-08 MED ORDER — POTASSIUM CHLORIDE CRYS ER 20 MEQ PO TBCR
40.0000 meq | EXTENDED_RELEASE_TABLET | ORAL | Status: AC
  Administered 2020-12-08 (×2): 40 meq via ORAL
  Filled 2020-12-08 (×2): qty 2

## 2020-12-08 MED ORDER — SODIUM CHLORIDE 0.9 % IV SOLN: INTRAVENOUS | Status: AC

## 2020-12-08 NOTE — Progress Notes (Signed)
Physical Therapy Treatment Patient Details Name: Jared [name] MRN: [id number] DOB: 11/17/1965 Today's Date: 12/08/2020   History of Present Illness Jared [name]  is a 55 y.o. male, with history of adjustment disorder, cardiopulmonary syndrome, cervical radiculopathy, chronic intractable headache, GERD, lumbar postlaminectomy syndrome, tobacco use disorder, and more presents ED with a chief complaint of altered mental status.  Apparently patient brother found him and called 911.  Patient received intranasal Narcan, and then became very agitated.  Patient is on chronic large amounts of Dilaudid and oxycodone at home for chronic back pain.  Patient would not provide any history to the ED provider, but was moving all of his extremities due to pain.  07 (he was too agitated to lay still for it so he received a couple doses of Ativan.  The time I saw him patient is very somnolent most likely secondary to Ativan.  Patient would not provide me with any history either.    PT Comments    Patient does not require physical assist with bed mobility. Patient demonstrates good sitting balance and sitting tolerance EOB. Patient slightly impulsive with mobility today. Patient transfers to standing and ambulates with RW with some unsteadiness but without loss of balance. Patient ambulates to commode and sink to perform hygenic tasks and returns to bed at end of session. Patient will benefit from continued physical therapy in hospital and recommended venue below to increase strength, balance, endurance for safe ADLs and gait.      Recommendations for follow up therapy are one component of a multi-disciplinary discharge planning process, led by the attending physician.  Recommendations may be updated based on patient status, additional functional criteria and insurance authorization.  Follow Up Recommendations  Home health PT;Supervision for mobility/OOB;Supervision - Intermittent     Equipment  Recommendations  None recommended by PT    Recommendations for Other Services       Precautions / Restrictions Precautions Precautions: Fall Restrictions Weight Bearing Restrictions: No     Mobility  Bed Mobility Overal bed mobility: Needs Assistance Bed Mobility: Supine to Sit;Sit to Supine     Supine to sit: Supervision Sit to supine: Supervision   General bed mobility comments: increased time, labored movement    Transfers Overall transfer level: Needs assistance Equipment used: Rolling walker (2 wheeled) Transfers: Sit to/from Stand Sit to Stand: Min guard Stand pivot transfers: Min guard;Min assist       General transfer comment: transfer to standing with RW, minimally unsteady upon standing  Ambulation/Gait Ambulation/Gait assistance: Min guard;Min assist Gait Distance (Feet): 20 Feet Assistive device: Rolling walker (2 wheeled) Gait Pattern/deviations: Decreased step length - right;Decreased step length - left;Decreased stride length;Trunk flexed Gait velocity: decreased   General Gait Details: slow, labored cadence with RW; minimally unsteady   Stairs             Wheelchair Mobility    Modified Rankin (Stroke Patients Only)       Balance Overall balance assessment: Needs assistance Sitting-balance support: Feet supported;No upper extremity supported Sitting balance-Leahy Scale: Good Sitting balance - Comments: seated EOB   Standing balance support: Bilateral upper extremity supported Standing balance-Leahy Scale: Fair Standing balance comment: good/fair with RW                            Cognition Arousal/Alertness: Awake/alert Behavior During Therapy: WFL for tasks assessed/performed Overall Cognitive Status: Within Functional Limits for tasks assessed  Exercises      General Comments        Pertinent Vitals/Pain Pain Assessment: Faces Faces Pain Scale:  Hurts even more Pain Location: low back and arms Pain Intervention(s): Limited activity within patient's tolerance;Monitored during session;Repositioned    Home Living                      Prior Function            PT Goals (current goals can now be found in the care plan section) Acute Rehab PT Goals Patient Stated Goal: return home with family to assist PT Goal Formulation: With patient Time For Goal Achievement: 12/12/20 Potential to Achieve Goals: Good Progress towards PT goals: Progressing toward goals    Frequency    Min 3X/week      PT Plan Current plan remains appropriate    Co-evaluation              AM-PAC PT "6 Clicks" Mobility   Outcome Measure  Help needed turning from your back to your side while in a flat bed without using bedrails?: None Help needed moving from lying on your back to sitting on the side of a flat bed without using bedrails?: A Little Help needed moving to and from a bed to a chair (including a wheelchair)?: A Little Help needed standing up from a chair using your arms (e.g., wheelchair or bedside chair)?: A Little Help needed to walk in hospital room?: A Little Help needed climbing 3-5 steps with a railing? : A Lot 6 Click Score: 18    End of Session   Activity Tolerance: Patient tolerated treatment well;Patient limited by fatigue Patient left: with call bell/phone within reach;in bed;with bed alarm set Nurse Communication: Mobility status PT Visit Diagnosis: Unsteadiness on feet (R26.81);Other abnormalities of gait and mobility (R26.89);Muscle weakness (generalized) (M62.81)     Time: 2703-5009 PT Time Calculation (min) (ACUTE ONLY): 14 min  Charges:  $Therapeutic Activity: 8-22 mins                     1:22 PM, 12/08/20 Wyman Songster PT, DPT Physical Therapist at [facility]

## 2020-12-08 NOTE — Care Management Important Message (Signed)
Important Message  Patient Details  Name: Jared [name] MRN: [id number] Date of Birth: 14-Sep-1965   Medicare Important Message Given:  Yes     Corey Harold 12/08/2020, 11:58 AM

## 2020-12-08 NOTE — TOC Initial Note (Signed)
Transition of Care [facility]) - Initial/Assessment Note    Patient Details  Name: Jared [name] MRN: [id number] Date of Birth: 14-Apr-1965  Transition of Care [facility]) CM/SW Contact:    Leitha Bleak, RN Phone Number: 12/08/2020, 2:00 PM  Clinical Narrative:    Patient admitted with sepsis, with a high risk for readmission. TOC at the bedside, patient lives alone. Walks the two forearm canes. His brother provides transportation as needed. TOC consulted for substance abuse resources. Patient is disabled Emergency planning/management officer, states he was [place] the jail. He states he does not need resources, he only drinks once every 2 weeks, uses marijuana and prescription medication for back pain. PT is recommending HHPT. Patient is agreeable. Linda with [facility] accepted the referral, needs orders for HHPT/RN. TOC requested orders.           Expected Discharge Plan: Home w Home Health Services Barriers to Discharge: Continued Medical Work up  Patient Goals and CMS Choice Patient states their goals for this hospitalization and ongoing recovery are:: to go home. CMS Medicare.gov Compare Post Acute Care list provided to:: Patient Choice offered to / list presented to : Patient  Expected Discharge Plan and Services Expected Discharge Plan: Home w Home Health Services    Post Acute Care Choice: Home Health Living arrangements for the past 2 months: Single Family Home                  Prior Living Arrangements/Services Living arrangements for the past 2 months: Single Family Home Lives with:: Self Patient language and need for interpreter reviewed:: Yes        Need for Family Participation in Patient Care: Yes (Comment) Care giver support system in place?: Yes (comment) Current home services: DME Criminal Activity/Legal Involvement Pertinent to Current Situation/Hospitalization: No - Comment as needed  Activities of Daily Living Home Assistive Devices/Equipment: None ADL Screening  (condition at time of admission) Patient's cognitive ability adequate to safely complete daily activities?: No Is the patient deaf or have difficulty hearing?: No Does the patient have difficulty seeing, even when wearing glasses/contacts?: No Does the patient have difficulty concentrating, remembering, or making decisions?: Yes Patient able to express need for assistance with ADLs?: No Does the patient have difficulty dressing or bathing?: Yes Independently performs ADLs?: Yes (appropriate for developmental age) Does the patient have difficulty walking or climbing stairs?: No Weakness of Legs: None Weakness of Arms/Hands: None  Permission Sought/Granted     Emotional Assessment    Affect (typically observed): Accepting, Pleasant Orientation: : Oriented to Self, Oriented to Place, Oriented to  Time, Oriented to Situation Alcohol / Substance Use: Alcohol Use Psych Involvement: No (comment)  Admission diagnosis:  Encephalopathy acute [G93.40] Sepsis (HCC) [A41.9] Fever, unspecified fever cause [R50.9] Patient Active Problem List   Diagnosis Date Noted   Sepsis (HCC) 12/06/2020   Acute pyelonephritis 12/06/2020   Hypokalemia 12/06/2020   Opiate dependence (HCC) 12/06/2020   Nondisplaced fracture of fifth right metatarsal bone with routine healing 01/27/2020   Hx of adrenal insufficiency 01/18/2018   Hypogonadism male 01/18/2018   Radiculopathy 06/13/2016   History of neck surgery 05/21/2016   Skin candidiasis 07/21/2015   Infection due to Enterobacter cloacae 07/10/2015   Infection and inflammatory reaction due to other nervous system device, implant or graft, initial encounter (HCC) 06/07/2015   Open wound of lumbar region with complication 04/20/2015   Nervous system device, implant, or graft infection or inflammation (HCC) 04/11/2015   Cellulitis 03/01/2015  Spinal cord compression (HCC) 01/20/2015   PCP:  Benita Stabile, MD Pharmacy:   [facility] [phone] - [place], [place]  - [address] AT [facility] [address] [place] [address] Phone: [phone] Fax: [phone]   Readmission Risk Interventions Readmission Risk Prevention Plan 12/08/2020  Transportation Screening Complete  HRI or Home Care Consult Complete  Social Work Consult for Recovery Care Planning/Counseling Complete  Palliative Care Screening Not Applicable  Medication Review Oceanographer) Complete  Some recent data might be hidden

## 2020-12-08 NOTE — Evaluation (Signed)
Occupational Therapy Evaluation Jared [name] Details Name: Jared [name] MRN: [id number] DOB: 1965/06/06 Today's Date: [name]   History of Present Illness Jared [name]  is a 55 y.o. male, with history of adjustment disorder, cardiopulmonary syndrome, cervical radiculopathy, chronic intractable headache, GERD, lumbar postlaminectomy syndrome, tobacco use disorder, and more presents ED with a chief complaint of altered mental status.  Apparently Jared [name] brother found him and called 911.  Jared [name] received intranasal Narcan, and then became very agitated.  Jared [name] is on chronic large amounts of Dilaudid and oxycodone at home for chronic back pain.  Jared [name] would not provide any history to the ED provider, but was moving all of his extremities due to pain.  07 (he was too agitated to lay still for it so he received a couple doses of Ativan.  The time I saw him Jared [name] is very somnolent most likely secondary to Ativan.  Jared [name] would not provide me with any history either.   Clinical Impression   Pt pleasant and agreeable to OT evaluation. Pt demonstrates bed mobility with SPV and ability to doff and don socks with extended time seated at EOB. Pt demonstrates significant unsteadiness on feet required Mod A when standing with loss of balance when attempting to ambulate forward. When pt initially stood from sitting at EOB he leaned to the R side and required assist from brother and this therapist to be lowered to the bed. Next attempt pt lost balance when stepping forward with use of forearm crutches and required mod A to walk backwards and be lowered to the bed. Pt's brother discussed possibility of pt needing rehab before coming home due to transfers being much more difficult then they were at baseline.  Pt demonstrates limited shoulder ROM from previous shoulder injuries. Pt will benefit from continued OT in the hospital and recommended venue below to increase strength, balance, and endurance for safe  ADL's.        Recommendations for follow up therapy are one component of a multi-disciplinary discharge planning process, led by the attending physician.  Recommendations may be updated based on Jared [name] status, additional functional criteria and insurance authorization.   Follow Up Recommendations  SNF    Equipment Recommendations  None recommended by OT           Precautions / Restrictions Precautions Precautions: Fall Restrictions Weight Bearing Restrictions: No      Mobility Bed Mobility Overal bed mobility: Needs Assistance Bed Mobility: Supine to Sit;Sit to Supine     Supine to sit: Supervision Sit to supine: Supervision   General bed mobility comments: increased time, labored movement    Transfers Overall transfer level: Needs assistance Equipment used: Crutches (forearm crutches) Transfers: Sit to/from Stand Sit to Stand: Mod assist         General transfer comment: Pt was very unsteady on feet leaning to R side when standing needing to be lowered to the bed by pt's brother and this therapist. Pt ambuated forward a step and leaned heavily on this therapist and required assist to be lowered back to bed.    Balance Overall balance assessment: Needs assistance Sitting-balance support: Feet supported;No upper extremity supported Sitting balance-Leahy Scale: Fair (fair to good) Sitting balance - Comments: seated EOB   Standing balance support: During functional activity;Bilateral upper extremity supported Standing balance-Leahy Scale: Poor Standing balance comment: poor using forearm crutches  ADL either performed or assessed with clinical judgement   ADL Overall ADL's : Needs assistance/impaired                 Upper Body Dressing : Sitting;Independent Upper Body Dressing Details (indicate cue type and reason): Able to don a hoodie seated at EOB. Lower Body Dressing: Set up;Sitting/lateral leans Lower Body  Dressing Details (indicate cue type and reason): Pt able to doff and on socks seated at EOB with extended time. Pt reports typically using a reacher at home. Toilet Transfer: Stand-pivot;Moderate assistance Toilet Transfer Details (indicate cue type and reason): Partially simulated via sit to stand and steps beside bed. Pt lost balance frequently and required physical assist to return to bed.                 Vision Baseline Vision/History: 1 Wears glasses Ability to See in Adequate Light: 0 Adequate Jared [name] Visual Report: No change from baseline                  Pertinent Vitals/Pain Pain Assessment: 0-10 Pain Score: 10-Worst pain ever Pain Location: low back and arms Pain Descriptors / Indicators: Other (Comment) ("all kinds of pain") Pain Intervention(s): Limited activity within Jared [name]'s tolerance;Monitored during session;Repositioned     Hand Dominance Right   Extremity/Trunk Assessment Upper Extremity Assessment Upper Extremity Assessment: RUE deficits/detail;LUE deficits/detail RUE Deficits / Details: Pt limited to ~75% of available A/ROM and P/ROM for shoulder flexion and abduction. WFL ROM elbow, wrist and digits. Genreally weak. RUE Coordination: WNL LUE Deficits / Details: LImited to ~90* A/ROM for shoulder flexion and abduction. P/ROM at ~75% of avialable range. Generally weak otherwise. LUE Coordination: WNL   Lower Extremity Assessment Lower Extremity Assessment: Defer to PT evaluation   Cervical / Trunk Assessment Cervical / Trunk Assessment: Kyphotic   Communication Communication Communication: No difficulties   Cognition Arousal/Alertness: Awake/alert Behavior During Therapy: WFL for tasks assessed/performed Overall Cognitive Status: Within Functional Limits for tasks assessed                                                      Home Living Family/Jared [name] expects to be discharged to:: Private residence Living Arrangements:  Alone Available Help at Discharge: Family;Available 24 hours/day Type of Home: Mobile home Home Access: Stairs to enter Entrance Stairs-Number of Steps: 2 Entrance Stairs-Rails: Right;Left;Can reach both Home Layout: One level     Bathroom Shower/Tub: Chief Strategy Officer: Handicapped height Bathroom Accessibility: Yes   Home Equipment: Environmental consultant - 2 wheels;Crutches;Other (comment)   Additional Comments: forearm crutches; taken from PT note; brother reports he is avialbe 24/7 but lives a mile away from the pt. Brother discussed pt possibly needing rehab prior to returning home.      Prior Functioning/Environment Level of Independence: Independent with assistive device(s);Needs assistance  Gait / Transfers Assistance Needed: household ambulator using loft strand crutches most of time, has SPC, RW, does not drive ADL's / Homemaking Assistance Needed: assisted by family            OT Problem List: Decreased strength;Decreased range of motion;Impaired balance (sitting and/or standing);Decreased safety awareness      OT Treatment/Interventions: Self-care/ADL training;Therapeutic exercise;Jared [name]/family education;Balance training;Therapeutic activities;DME and/or AE instruction    OT Goals(Current goals can be found in the care plan section) Acute Rehab OT Goals  Jared [name] Stated Goal: return home with family to assist OT Goal Formulation: With Jared [name]/family Time For Goal Achievement: 12/22/20 Potential to Achieve Goals: Good  OT Frequency: Min 2X/week    End of Session Equipment Utilized During Treatment: Other (comment) (forearm crutches)  Activity Tolerance: Jared [name] tolerated treatment well Jared [name] left: in bed;with call bell/phone within reach;with bed alarm set;with nursing/sitter in room;with family/visitor present  OT Visit Diagnosis: Unsteadiness on feet (R26.81);Muscle weakness (generalized) (M62.81)                Time: 1607-3710 OT Time Calculation  (min): 19 min Charges:  OT General Charges $OT Visit: 1 Visit OT Evaluation $OT Eval Low Complexity: 1 Low  Jared [name] OT, MOT  Jared [name], 9:53 AM

## 2020-12-08 NOTE — Plan of Care (Signed)
  Problem: Acute Rehab OT Goals (only OT should resolve) Goal: Pt. Will Perform Grooming Flowsheets (Taken 12/08/2020 0957) Pt Will Perform Grooming:  with supervision  standing  with adaptive equipment Goal: Pt. Will Transfer To Toilet Flowsheets (Taken 12/08/2020 0957) Pt Will Transfer to Toilet:  with supervision  stand pivot transfer Goal: Pt/Caregiver Will Perform Home Exercise Program Flowsheets (Taken 12/08/2020 0957) Pt/caregiver will Perform Home Exercise Program:  Increased ROM  Increased strength  Both right and left upper extremity  With Supervision  Rhylei Mcquaig OT, MOT

## 2020-12-08 NOTE — Progress Notes (Signed)
PROGRESS NOTE    Jared [name]  JOA:[id number] DOB: [birth year] DOA: 12/05/2020 PCP: Benita Stabile, MD   Brief Narrative:  55 year old with history of adjustment disorder, cardiopulmonary syndrome, cervical radiculopathy, chronic intractable pain, GERD, cauda equina syndrome lumbar postlaminectomy, tobacco use presents to the ED with altered mental status.  Initially patient was very agitated, typically uses large amounts of Dilaudid and oxycodone for chronic back pain.  Upon admission she was febrile, tachycardic and hypertensive.  COVID-19 negative.  Chest x-ray unremarkable, UDS positive for marijuana and opioids.  CT head did not show any acute pathology but showed multifocal dental periapical lucencies.  CT abdomen pelvis was questionable for bilateral pyelonephritis.  Patient was admitted for agitation, sepsis.  Initially patient started on Precedex drip which was weaned off over 24 hours as his mentation was improving.  His sepsis physiology was improving and he was tolerating orals therefore antibiotics were changed to oral Augmentin.   Assessment & Plan:   Active Problems:   Sepsis (HCC)   Acute pyelonephritis   Hypokalemia   Opiate dependence (HCC)   Acute metabolic encephalopathy, improved - This is improved.  Combination of withdrawal and possible underlying infection. -CT head negative, shows periapical dental lucencies -EEG shows generalized slowing but no seizure activity - He has been weaned off Precedex drip.  Sepsis secondary to acute pyelonephritis with concerns of aspiration pneumonia - Cultures are negative.  Procalcitonin 0.89.  On oral Augmentin, last day 9/26.  Rhabdomyolysis - CK levels increasing.  Today is 4.8K.  Increased normal saline 250 cc/h.  Hypokalemia - Replete today  Chronic pain, opiate dependence - At home he is on hydromorphone 60 mg twice daily, Roxicodone 15 mg every 4 hours as needed. - Now he is off Precedex  GERD - PPI at  home.  History of adjustment disorder - On Zoloft at home  Tobacco use - As needed bronchodilators  Essential hypertension, uncontrolled - Increase clonidine to 0.2 mg twice daily.  IV hydralazine as needed    DVT prophylaxis: SQ heparin Code Status: Full Family Communication: None at bedside  Status is: Inpatient  Remains inpatient appropriate because:Inpatient level of care appropriate due to severity of illness.    Dispo: The patient is from: Home              Anticipated d/c is to: Home              Patient currently is not medically stable to d/c.  Maintain hospital stay for aggressive IV fluids due to rising CK levels.  Also need potassium repletion   Difficult to place patient No        Subjective: Apparently patient had an episode of fall yesterday without any head trauma.  This morning he is sitting at bedside does not have any complaints.  He has a " smoking pen" at bedside which he states he is not using and its his brothers.  I explained to him that if he is caught using it, it will be taken away by our security  ROS:   General: Denies fever, chills, night sweats or unintended weight loss. Resp: Denies cough, wheezing, shortness of breath. Cardiac: Denies chest pain, palpitations, orthopnea, paroxysmal nocturnal dyspnea. GI: Denies abdominal pain, nausea, vomiting, diarrhea or constipation GU: Denies dysuria, frequency, hesitancy or incontinence MS: Denies muscle aches, joint pain or swelling Neuro: Denies headache, neurologic deficits (focal weakness, numbness, tingling), abnormal gait Psych: Denies anxiety, depression, SI/HI/AVH Skin: Denies new rashes or lesions ID: Denies  sick contacts, exotic exposures, travel  Physical exam: Constitutional: Not in acute distress Respiratory: Clear to auscultation bilaterally Cardiovascular: Normal sinus rhythm, no rubs Abdomen: Nontender nondistended good bowel sounds Musculoskeletal: No edema noted Skin: No  rashes seen Neurologic: CN 2-12 grossly intact.  And nonfocal Psychiatric: Normal judgment and insight. Alert and oriented x 3. Normal mood.  Objective: Vitals:   12/07/20 2043 12/07/20 2357 12/08/20 0513 12/08/20 1120  BP: (!) 143/94 (!) 166/91 (!) 149/92   Pulse: 93 80 81   Resp: (!) 21 20 (!) 21   Temp: 98.1 F (36.7 C) 98.5 F (36.9 C) 99.2 F (37.3 C)   TempSrc: Oral Oral Oral   SpO2: 98% 97% 97% 97%  Weight:      Height:        Intake/Output Summary (Last 24 hours) at 12/08/2020 1218 Last data filed at 12/08/2020 0900 Gross per 24 hour  Intake 1092 ml  Output 600 ml  Net 492 ml   Filed Weights   12/05/20 1659 12/06/20 0315  Weight: 95.3 kg 76.4 kg     Data Reviewed:   CBC: Recent Labs  Lab 12/05/20 1748 12/06/20 0422 12/07/20 0442 12/08/20 0531  WBC 25.5* 19.7* 11.3* 8.2  NEUTROABS 20.8* 15.1*  --   --   HGB 19.1* 17.3* 14.0 13.8  HCT 55.0* 50.6 42.7 42.1  MCV 96.3 97.3 99.5 98.8  PLT 256 198 149* 155   Basic Metabolic Panel: Recent Labs  Lab 12/05/20 1748 12/06/20 0422 12/07/20 0442 12/08/20 0531  NA 136 137 135 138  K 3.2* 3.2* 3.4* 2.9*  CL 101 103 105 104  CO2 GLUCOSE 128* 116* 91 103*  BUN 5*  CREATININE 1.21 0.91 0.91 0.89  CALCIUM 9.3 8.6* 7.9* 8.4*  MG  --  2.1 1.9 1.8  PHOS  --   --   --  2.5   GFR: Estimated Creatinine Clearance: 96.8 mL/min (by C-G formula based on SCr of 0.89 mg/dL). Liver Function Tests: Recent Labs  Lab 12/05/20 1748 12/06/20 0422  AST 32 34  ALT 17 17  ALKPHOS 178* 140*  BILITOT 0.8 0.8  PROT 9.1* 7.5  ALBUMIN 4.9 3.9   No results for input(s): LIPASE, AMYLASE in the last 168 hours. No results for input(s): AMMONIA in the last 168 hours. Coagulation Profile: Recent Labs  Lab 12/06/20 0422  INR 1.3*   Cardiac Enzymes: Recent Labs  Lab 12/05/20 1748 12/06/20 0422 12/07/20 0442 12/08/20 0531  CKTOTAL 625* 970* 1,534* 4,888*   BNP (last 3 results) No results for  input(s): PROBNP in the last 8760 hours. HbA1C: No results for input(s): HGBA1C in the last 72 hours. CBG: No results for input(s): GLUCAP in the last 168 hours. Lipid Profile: No results for input(s): CHOL, HDL, LDLCALC, TRIG, CHOLHDL, LDLDIRECT in the last 72 hours. Thyroid Function Tests: Recent Labs    12/06/20 0906  TSH 2.142   Anemia Panel: Recent Labs    12/06/20 0906  VITAMINB12 264   Sepsis Labs: Recent Labs  Lab 12/05/20 2006 12/05/20 2214 12/06/20 0101 12/06/20 0422 12/06/20 0704  PROCALCITON  --   --   --  0.89  --   LATICACIDVEN 2.3* 2.0* 2.2*  --  1.6    Recent Results (from the past 240 hour(s))  Urine Culture     Status: None   Collection Time: 12/05/20  7:23 PM   Specimen: In/Out Cath Urine  Result Value Ref Range  Status   Specimen Description   Final    IN/OUT CATH URINE Performed at [facility], [address], [place], [place] [address]    Special Requests   Final    NONE Performed at [facility], [address] [place], [place] [address]    Culture   Final    NO GROWTH Performed at [facility], [address] [place], [place] [address]    Report Status 12/07/2020 FINAL  Final  Culture, blood (routine x 2)     Status: None (Preliminary result)   Collection Time: 12/05/20  8:06 PM   Specimen: BLOOD RIGHT HAND  Result Value Ref Range Status   Specimen Description BLOOD RIGHT HAND  Final   Special Requests   Final    Blood Culture adequate volume BOTTLES DRAWN AEROBIC AND ANAEROBIC   Culture   Final    NO GROWTH 3 DAYS Performed at [facility], [address] [place] [address] [place], [place] [address]    Report Status PENDING  Incomplete  Culture, blood (routine x 2)     Status: None (Preliminary result)   Collection Time: 12/05/20  8:06 PM   Specimen: BLOOD LEFT FOREARM  Result Value Ref Range Status   Specimen Description BLOOD LEFT FOREARM  Final   Special Requests   Final    Blood Culture results may not be optimal due to an excessive  volume of blood received in culture bottles BOTTLES DRAWN AEROBIC AND ANAEROBIC   Culture   Final    NO GROWTH 3 DAYS Performed at [facility], [address], [place], [place] [address]    Report Status PENDING  Incomplete  Resp Panel by RT-PCR (Flu A&B, Covid) Nasopharyngeal Swab     Status: None   Collection Time: 12/05/20  8:13 PM   Specimen: Nasopharyngeal Swab; Nasopharyngeal(NP) swabs in vial transport medium  Result Value Ref Range Status   SARS Coronavirus 2 by RT PCR NEGATIVE NEGATIVE Final    Comment: (NOTE) SARS-CoV-2 target nucleic acids are NOT DETECTED.  The SARS-CoV-2 RNA is generally detectable in upper respiratory specimens during the acute phase of infection. The lowest concentration of SARS-CoV-2 viral copies this assay can detect is 138 copies/mL. A negative result does not preclude SARS-Cov-2 infection and should not be used as the sole basis for treatment or other patient management decisions. A negative result may occur with  improper specimen collection/handling, submission of specimen other than nasopharyngeal swab, presence of viral mutation(s) within the areas targeted by this assay, and inadequate number of viral copies(<138 copies/mL). A negative result must be combined with clinical observations, patient history, and epidemiological information. The expected result is Negative.  Fact Sheet for Patients:  BloggerCourse.com  Fact Sheet for Healthcare Providers:  SeriousBroker.it  This test is no t yet approved or cleared by the [place] FDA and  has been authorized for detection and/or diagnosis of SARS-CoV-2 by FDA under an Emergency Use Authorization (EUA). This EUA will remain  in effect (meaning this test can be used) for the duration of the COVID-19 declaration under Section 564(b)(1) of the Act, 21 U.S.C.section 360bbb-3(b)(1), unless the authorization is terminated  or revoked sooner.        Influenza A by PCR NEGATIVE NEGATIVE Final   Influenza B by PCR NEGATIVE NEGATIVE Final    Comment: (NOTE) The Xpert Xpress SARS-CoV-2/FLU/RSV plus assay is intended as an aid in the diagnosis of influenza from Nasopharyngeal swab specimens and should not be used as a  sole basis for treatment. Nasal washings and aspirates are unacceptable for Xpert Xpress SARS-CoV-2/FLU/RSV testing.  Fact Sheet for Patients: BloggerCourse.com  Fact Sheet for Healthcare Providers: SeriousBroker.it  This test is not yet approved or cleared by the [place] FDA and has been authorized for detection and/or diagnosis of SARS-CoV-2 by FDA under an Emergency Use Authorization (EUA). This EUA will remain in effect (meaning this test can be used) for the duration of the COVID-19 declaration under Section 564(b)(1) of the Act, 21 U.S.C. section 360bbb-3(b)(1), unless the authorization is terminated or revoked.  Performed at [facility], [address], [place], [place] [address]   MRSA Next Gen by PCR, Nasal     Status: None   Collection Time: 12-08-20  2:46 AM   Specimen: Nasal Mucosa; Nasal Swab  Result Value Ref Range Status   MRSA by PCR Next Gen NOT DETECTED NOT DETECTED Final    Comment: (NOTE) The GeneXpert MRSA Assay (FDA approved for NASAL specimens only), is one component of a comprehensive MRSA colonization surveillance program. It is not intended to diagnose MRSA infection nor to guide or monitor treatment for MRSA infections. Test performance is not FDA approved in patients less than 66 years old. Performed at [facility], [address], [place], [place] [address]          Radiology Studies: EEG adult  Result Date: 12/08/2020 Jefferson Fuel, MD     12-08-2020  5:40 PM Routine EEG Report Ray Gervasi is a 55 y.o. male with a history of altered mental status who is undergoing an EEG to evaluate for seizures. Report:  This EEG was acquired with electrodes placed according to the International 10-20 electrode system (including Fp1, Fp2, F3, F4, C3, C4, P3, P4, O1, O2, T3, T4, T5, T6, A1, A2, Fz, Cz, Pz). The following electrodes were missing or displaced: none. There was no waking rhythm observed. The best background was 6-7 Hz with intermittent superimposed more pronounced diffuse slowing. This activity is reactive to stimulation. Sleep was manifested as K complexes and sleep spindles. There was no focal slowing. There were no interictal epileptiform discharges. There were no electrographic seizures identified. Photic stimulation and hyperventilation were not performed. Impression and clinical correlation: This EEG was obtained while sedated with ativan and asleep and is abnormal due to moderate diffuse slowing, indicative of global cerebral dysfunction. Bing Neighbors, MD [facility] [phone] If 7pm- 7am, please page neurology on call as listed in AMION.        Scheduled Meds:  amoxicillin-clavulanate  1 tablet Oral Q12H   vitamin C  500 mg Oral BID   Chlorhexidine Gluconate Cloth  6 each Topical Daily   cloNIDine  0.2 mg Oral BID   dronabinol  5 mg Oral BID AC   feeding supplement   Oral QID   folic acid  1 mg Oral Daily   heparin  5,000 Units Subcutaneous Q8H   imipramine  10 mg Oral QHS   LORazepam  0-4 mg Intravenous Q12H   multivitamin with minerals  1 tablet Oral Daily   pantoprazole  40 mg Oral Daily   potassium chloride  40 mEq Oral Q4H   sertraline  100 mg Oral QHS   thiamine  100 mg Oral Daily   Or   thiamine  100 mg Intravenous Daily   zinc sulfate  220 mg Oral Daily   Continuous Infusions:  sodium chloride 150 mL/hr at 12/08/20 0847   dexmedetomidine (PRECEDEX) IV infusion Stopped (2020-12-08 1900)  potassium chloride 10 mEq (12/08/20 1151)     LOS: 2 days   Time spent= 35 mins    Jaquavis Felmlee Joline Maxcy, MD [facility]  If 7PM-7AM, please contact  night-coverage  12/08/2020, 12:18 PM

## 2020-12-09 DIAGNOSIS — G9340 Encephalopathy, unspecified: Secondary | ICD-10-CM

## 2020-12-09 DIAGNOSIS — F11288 Opioid dependence with other opioid-induced disorder: Secondary | ICD-10-CM

## 2020-12-09 LAB — CBC
HCT: 44.1 % (ref 39.0–52.0)
Hemoglobin: 14.5 g/dL (ref 13.0–17.0)
MCH: 33.3 pg (ref 26.0–34.0)
MCHC: 32.9 g/dL (ref 30.0–36.0)
MCV: 101.1 fL — ABNORMAL HIGH (ref 80.0–100.0)
Platelets: 176 10*3/uL (ref 150–400)
RBC: 4.36 MIL/uL (ref 4.22–5.81)
RDW: 13.8 % (ref 11.5–15.5)
WBC: 7.5 10*3/uL (ref 4.0–10.5)
nRBC: 0 % (ref 0.0–0.2)

## 2020-12-09 LAB — BASIC METABOLIC PANEL
Anion gap: 7 (ref 5–15)
BUN: <5 mg/dL — ABNORMAL LOW (ref 6–20)
CO2: 26 mmol/L (ref 22–32)
Calcium: 8.7 mg/dL — ABNORMAL LOW (ref 8.9–10.3)
Chloride: 108 mmol/L (ref 98–111)
Creatinine, Ser: 0.81 mg/dL (ref 0.61–1.24)
GFR, Estimated: >60 mL/min (ref >60)
Glucose, Bld: 87 mg/dL (ref 70–99)
Potassium: 4.1 mmol/L (ref 3.5–5.1)
Sodium: 141 mmol/L (ref 135–145)

## 2020-12-09 LAB — CK
Total CK: 3523 U/L — ABNORMAL HIGH (ref 49–397)
Total CK: 3529 U/L — ABNORMAL HIGH (ref 49–397)

## 2020-12-09 LAB — MAGNESIUM: Magnesium: 1.9 mg/dL (ref 1.7–2.4)

## 2020-12-09 MED ORDER — SODIUM CHLORIDE 0.9 % IV SOLN: INTRAVENOUS | Status: AC

## 2020-12-09 MED ORDER — LORAZEPAM 0.5 MG PO TABS
0.5000 mg | ORAL_TABLET | Freq: Three times a day (TID) | ORAL | Status: DC | PRN
  Administered 2020-12-09 (×2): 0.5 mg via ORAL
  Filled 2020-12-09 (×2): qty 1

## 2020-12-09 NOTE — Progress Notes (Signed)
PROGRESS NOTE  Jared Li NBV:[id number] DOB: [birth year] DOA: 12/05/2020 PCP: Benita Stabile, MD  Brief History:  55 year old with history of adjustment disorder, cardiopulmonary syndrome, cervical radiculopathy, chronic intractable pain, GERD, cauda equina syndrome lumbar postlaminectomy, tobacco use presents to the ED with altered mental status.  Initially patient was very agitated, typically uses large amounts of Dilaudid and oxycodone for chronic back pain.  Upon admission she was febrile, tachycardic and hypertensive.  COVID-19 negative.  Chest x-ray unremarkable, UDS positive for marijuana and opioids.  CT head did not show any acute pathology but showed multifocal dental periapical lucencies.  CT abdomen pelvis was questionable for bilateral pyelonephritis.  Patient was admitted for agitation, sepsis.  Initially patient started on Precedex drip which was weaned off over 24 hours as his mentation was improving.  His sepsis physiology was improving and he was tolerating orals therefore antibiotics were changed to oral Augmentin.    Assessment/Plan:   Acute metabolic encephalopathy, improved - This is improved.  Combination of withdrawal and underlying infection. -CT head negative, shows periapical dental lucencies -EEG shows generalized slowing but no seizure activity - He has been weaned off Precedex drip.   Sepsis secondary to acute pyelonephritis with concerns of aspiration pneumonia - Cultures are negative.  Procalcitonin 0.89.  On oral Augmentin, last day 9/26.   Rhabdomyolysis - [id number] -continue IVF   Hypokalemia - Replete today   Chronic pain, opiate dependence - At home he is on hydromorphone 16 mg twice daily, Roxicodone 15 mg every 4 hours as needed. - Now he is off Precedex   GERD - PPI at home.   Adjustment disorder - On Zoloft at home   Tobacco use - As needed bronchodilators   Essential hypertension, uncontrolled - Increase clonidine to  0.2 mg twice daily.  IV hydralazine as needed        Status is: Inpatient  Remains inpatient appropriate because:Inpatient level of care appropriate due to severity of illness  Dispo: The patient is from: Home              Anticipated d/c is to: Home              Patient currently is not medically stable to d/c.   Difficult to place patient No        Family Communication:   no Family at bedside  Consultants:  none  Code Status:  FULL   DVT Prophylaxis:  Barton Heparin    Procedures: As Listed in Progress Note Above  Antibiotics: None       Subjective: He complains of chronic back pain.  Denies f/c cp, sob, vomiting, diarrhea.  He has nausea.  No abd pain, hematochezia, melena.  Objective: Vitals:   12/08/20 1333 12/08/20 2048 12/09/20 0635 12/09/20 0927  BP: (!) 149/95 (!) 172/117 (!) 160/108 (!) 167/96  Pulse: 79 80 82   Resp: 18 19 20    Temp: 98 F (36.7 C) 98.4 F (36.9 C) 98.4 F (36.9 C)   TempSrc: Oral Oral Oral   SpO2: 98% 98% 96%   Weight:      Height:        Intake/Output Summary (Last 24 hours) at 12/09/2020 1334 Last data filed at 12/09/2020 0900 Gross per 24 hour  Intake 2085.32 ml  Output 1325 ml  Net 760.32 ml   Weight change:  Exam:  General:  Pt is alert, follows commands appropriately, not in acute distress HEENT:  No icterus, No thrush, No neck mass, [place]/AT Cardiovascular: RRR, S1/S2, no rubs, no gallops Respiratory: CTA bilaterally, no wheezing, no crackles, no rhonchi Abdomen: Soft/+BS, non tender, non distended, no guarding Extremities: No edema, No lymphangitis, No petechiae, No rashes, no synovitis   Data Reviewed: I have personally reviewed following labs and imaging studies Basic Metabolic Panel: Recent Labs  Lab 12/05/20 1748 12/06/20 0422 12/07/20 0442 12/08/20 0531 12/09/20 0551  NA 136 137 135 138 141  K 3.2* 3.2* 3.4* 2.9* 4.1  CL 101 103 105 104 108  CO2 22 23 24 26 26   GLUCOSE 128* 116* 91 103* 87   BUN 10 9 10  5* <5*  CREATININE 1.21 0.91 0.91 0.89 0.81  CALCIUM 9.3 8.6* 7.9* 8.4* 8.7*  MG  --  2.1 1.9 1.8 1.9  PHOS  --   --   --  2.5  --    Liver Function Tests: Recent Labs  Lab 12/05/20 1748 12/06/20 0422  AST 32 34  ALT 17 17  ALKPHOS 178* 140*  BILITOT 0.8 0.8  PROT 9.1* 7.5  ALBUMIN 4.9 3.9   No results for input(s): LIPASE, AMYLASE in the last 168 hours. No results for input(s): AMMONIA in the last 168 hours. Coagulation Profile: Recent Labs  Lab 12/06/20 0422  INR 1.3*   CBC: Recent Labs  Lab 12/05/20 1748 12/06/20 0422 12/07/20 0442 12/08/20 0531 12/09/20 0551  WBC 25.5* 19.7* 11.3* 8.2 7.5  NEUTROABS 20.8* 15.1*  --   --   --   HGB 19.1* 17.3* 14.0 13.8 14.5  HCT 55.0* 50.6 42.7 42.1 44.1  MCV 96.3 97.3 99.5 98.8 101.1*  PLT 256 198 149* 155 176   Cardiac Enzymes: Recent Labs  Lab 12/05/20 1748 12/06/20 0422 12/07/20 0442 12/08/20 0531 12/09/20 0551  CKTOTAL 625* 970* 1,534* 4,888* 3,523*   BNP: Invalid input(s): POCBNP CBG: No results for input(s): GLUCAP in the last 168 hours. HbA1C: No results for input(s): HGBA1C in the last 72 hours. Urine analysis:    Component Value Date/Time   COLORURINE YELLOW 12/05/2020 1925   APPEARANCEUR CLEAR 12/05/2020 1925   LABSPEC >1.030 (H) 12/05/2020 1925   PHURINE 6.0 12/05/2020 1925   GLUCOSEU 100 (A) 12/05/2020 1925   HGBUR LARGE (A) 12/05/2020 1925   BILIRUBINUR NEGATIVE 12/05/2020 1925   KETONESUR NEGATIVE 12/05/2020 1925   PROTEINUR 100 (A) 12/05/2020 1925   NITRITE NEGATIVE 12/05/2020 1925   LEUKOCYTESUR NEGATIVE 12/05/2020 1925   Sepsis Labs: @LABRCNTIP (procalcitonin:4,lacticidven:4) ) Recent Results (from the past 240 hour(s))  Urine Culture     Status: None   Collection Time: 12/05/20  7:23 PM   Specimen: In/Out Cath Urine  Result Value Ref Range Status   Specimen Description   Final    IN/OUT CATH URINE Performed at [facility], [address], [place], [facility] [address]     Special Requests   Final    NONE Performed at [facility], [address], [place], [facility] [address]    Culture   Final    NO GROWTH Performed at [facility], [address] [place], [facility] [address]    Report Status 12/07/2020 FINAL  Final  Culture, blood (routine x 2)     Status: None (Preliminary result)   Collection Time: 12/05/20  8:06 PM   Specimen: BLOOD RIGHT HAND  Result Value Ref Range Status   Specimen Description BLOOD RIGHT HAND  Final   Special Requests   Final    Blood Culture  adequate volume BOTTLES DRAWN AEROBIC AND ANAEROBIC   Culture   Final    NO GROWTH 4 DAYS Performed at [facility], [address], [place], [place] [address]    Report Status PENDING  Incomplete  Culture, blood (routine x 2)     Status: None (Preliminary result)   Collection Time: 12/05/20  8:06 PM   Specimen: BLOOD LEFT FOREARM  Result Value Ref Range Status   Specimen Description BLOOD LEFT FOREARM  Final   Special Requests   Final    Blood Culture results may not be optimal due to an excessive volume of blood received in culture bottles BOTTLES DRAWN AEROBIC AND ANAEROBIC   Culture   Final    NO GROWTH 4 DAYS Performed at [facility], [address] [place], [place] [address]    Report Status PENDING  Incomplete  Resp Panel by RT-PCR (Flu A&B, Covid) Nasopharyngeal Swab     Status: None   Collection Time: 12/05/20  8:13 PM   Specimen: Nasopharyngeal Swab; Nasopharyngeal(NP) swabs in vial transport medium  Result Value Ref Range Status   SARS Coronavirus 2 by RT PCR NEGATIVE NEGATIVE Final    Comment: (NOTE) SARS-CoV-2 target nucleic acids are NOT DETECTED.  The SARS-CoV-2 RNA is generally detectable in upper respiratory specimens during the acute phase of infection. The lowest concentration of SARS-CoV-2 viral copies this assay can detect is 138 copies/mL. A negative result does not preclude SARS-Cov-2 infection and should not be used as the sole basis for treatment  or other patient management decisions. A negative result may occur with  improper specimen collection/handling, submission of specimen other than nasopharyngeal swab, presence of viral mutation(s) within the areas targeted by this assay, and inadequate number of viral copies(<138 copies/mL). A negative result must be combined with clinical observations, patient history, and epidemiological information. The expected result is Negative.  Fact Sheet for Patients:  BloggerCourse.com  Fact Sheet for Healthcare Providers:  SeriousBroker.it  This test is no t yet approved or cleared by the [place] FDA and  has been authorized for detection and/or diagnosis of SARS-CoV-2 by FDA under an Emergency Use Authorization (EUA). This EUA will remain  in effect (meaning this test can be used) for the duration of the COVID-19 declaration under Section 564(b)(1) of the Act, 21 U.S.C.section 360bbb-3(b)(1), unless the authorization is terminated  or revoked sooner.       Influenza A by PCR NEGATIVE NEGATIVE Final   Influenza B by PCR NEGATIVE NEGATIVE Final    Comment: (NOTE) The Xpert Xpress SARS-CoV-2/FLU/RSV plus assay is intended as an aid in the diagnosis of influenza from Nasopharyngeal swab specimens and should not be used as a sole basis for treatment. Nasal washings and aspirates are unacceptable for Xpert Xpress SARS-CoV-2/FLU/RSV testing.  Fact Sheet for Patients: BloggerCourse.com  Fact Sheet for Healthcare Providers: SeriousBroker.it  This test is not yet approved or cleared by the [place] FDA and has been authorized for detection and/or diagnosis of SARS-CoV-2 by FDA under an Emergency Use Authorization (EUA). This EUA will remain in effect (meaning this test can be used) for the duration of the COVID-19 declaration under Section 564(b)(1) of the Act, 21 U.S.C. section  360bbb-3(b)(1), unless the authorization is terminated or revoked.  Performed at [facility], [address], [place], [place] [address]   MRSA Next Gen by PCR, Nasal     Status: None   Collection Time: 12/06/20  2:46 AM   Specimen: Nasal Mucosa; Nasal Swab  Result Value Ref Range Status   MRSA by PCR Next Gen NOT DETECTED NOT DETECTED Final    Comment: (NOTE) The GeneXpert MRSA Assay (FDA approved for NASAL specimens only), is one component of a comprehensive MRSA colonization surveillance program. It is not intended to diagnose MRSA infection nor to guide or monitor treatment for MRSA infections. Test performance is not FDA approved in patients less than 24 years old. Performed at [facility], [address], [place], [place] [address]      Scheduled Meds:  amoxicillin-clavulanate  1 tablet Oral Q12H   vitamin C  500 mg Oral BID   Chlorhexidine Gluconate Cloth  6 each Topical Daily   cloNIDine  0.2 mg Oral BID   dronabinol  5 mg Oral BID AC   feeding supplement   Oral QID   folic acid  1 mg Oral Daily   heparin  5,000 Units Subcutaneous Q8H   imipramine  10 mg Oral QHS   LORazepam  0-4 mg Intravenous Q12H   multivitamin with minerals  1 tablet Oral Daily   pantoprazole  40 mg Oral Daily   sertraline  100 mg Oral QHS   thiamine  100 mg Oral Daily   Or   thiamine  100 mg Intravenous Daily   zinc sulfate  220 mg Oral Daily   Continuous Infusions:  dexmedetomidine (PRECEDEX) IV infusion Stopped (12/06/20 1900)    Procedures/Studies: CT HEAD WO CONTRAST ( )  Result Date: 12/05/2020 CLINICAL DATA:  Delirium EXAM: CT HEAD WITHOUT CONTRAST CT MAXILLOFACIAL WITHOUT CONTRAST TECHNIQUE: Multidetector CT imaging of the head and maxillofacial structures were performed using the standard protocol without intravenous contrast. Multiplanar CT image reconstructions of the maxillofacial structures were also generated. COMPARISON:  None. FINDINGS: Motion degraded examination.  Technologist reported patient combative during the exam. CT HEAD FINDINGS Brain: No evidence of acute infarction, hemorrhage, hydrocephalus, extra-axial collection or mass lesion/mass effect. Vascular: No hyperdense vessel or unexpected calcification. Skull: Normal. Negative for fracture or focal lesion. Other: None. CT MAXILLOFACIAL FINDINGS Osseous: No acute maxillofacial bone fracture. Bony orbital walls are intact. Mandible intact. Temporomandibular joints are aligned without dislocation. Multifocal dental periapical lucencies. Orbits: Negative. No traumatic or inflammatory finding. Sinuses: Clear. Soft tissues: No soft tissue swelling or hematoma. IMPRESSION: 1. This limitation there is motion degraded examination. Within no evidence of an acute intracranial process. 2. No acute maxillofacial bone fracture. 3. Multifocal dental periapical lucencies. Electronically Signed   By: Duanne Guess D.O.   On: 12/05/2020 18:11   CT ABDOMEN PELVIS W CONTRAST  Result Date: 12/05/2020 CLINICAL DATA:  Abdominal pain with fever. EXAM: CT ABDOMEN AND PELVIS WITH CONTRAST TECHNIQUE: Multidetector CT imaging of the abdomen and pelvis was performed using the standard protocol following bolus administration of intravenous contrast. CONTRAST:  52mL OMNIPAQUE IOHEXOL 350 MG/ML SOLN COMPARISON:  None. FINDINGS: Lower chest: No acute abnormality. Hepatobiliary: No focal liver abnormality is seen. No gallstones, gallbladder wall thickening, or biliary dilatation. Pancreas: Unremarkable. No pancreatic ductal dilatation or surrounding inflammatory changes. Spleen: Normal in size without focal abnormality. Adrenals/Urinary Tract: There is limited evaluation secondary to motion artifact. There are questionable areas of subtle patchy hypodensity throughout the bilateral renal parenchyma, although this may be related to streak artifact. Kidneys are normal in size and contour. There is no hydronephrosis or perinephric fluid. The  bladder is within normal limits. Stomach/Bowel: Stomach is within normal limits. Appendix appears normal. No evidence of bowel wall thickening, distention, or inflammatory changes. Vascular/Lymphatic: Aortic atherosclerosis. No  enlarged abdominal or pelvic lymph nodes. Reproductive: Not well evaluated secondary to motion artifact. Other: No abdominal wall hernia or abnormality. No abdominopelvic ascites. Musculoskeletal: L4-S1 posterior fusion hardware is present and appears uncomplicated. No acute fractures are seen. IMPRESSION: 1. Questionable patchy areas of hypodensity throughout the bilateral renal parenchyma which would be concerning for pyelonephritis. Note is made that there is artifact in this region. Please correlate clinically. No hydronephrosis or perinephric fluid collection. 2.  Aortic Atherosclerosis (ICD10-I70.0). Electronically Signed   By: Darliss Cheney M.D.   On: 12/05/2020 23:40   CT CORONARY MORPH W/CTA COR W/SCORE W/CA W/CM &/OR WO/CM  Addendum Date: 11/10/2020   ADDENDUM REPORT: 11/10/2020 10:44 EXAM: OVER-READ INTERPRETATION  CT CHEST The following report is an over-read performed by radiologist Dr. Maryelizabeth Rowan [facility] on 11/10/2020. This over-read does not include interpretation of cardiac or coronary anatomy or pathology. The calcium score interpretation by the cardiologist is attached. COMPARISON:  None. FINDINGS: Limited view of the lung parenchyma demonstrates no suspicious nodularity. Airways are normal. Limited view of the mediastinum demonstrates no adenopathy. Esophagus normal. Limited view of the upper abdomen unremarkable. Limited view of the skeleton and chest wall is unremarkable. IMPRESSION: No significant extracardiac findings. Electronically Signed   By: Genevive Bi M.D.   On: 11/10/2020 10:44   Result Date: 11/10/2020 CLINICAL DATA:  Dyspnea on exertion EXAM: Cardiac/Coronary  CTA TECHNIQUE: The patient was scanned on a Siemens Somatom go.Top  scanner. : A retrospective scan was triggered in the descending thoracic aorta. Axial non-contrast 3 mm slices were carried out through the heart. The data set was analyzed on a dedicated work station and scored using the Agatson method. Gantry rotation speed was 330 msecs and collimation was .6 mm. 25 mg of metoprolol and 0.8 mg of sl NTG was given. The 3D data set was reconstructed in 5% intervals of the 60-95 % of the R-R cycle. Diastolic phases were analyzed on a dedicated work station using MPR, MIP and VRT modes. The patient received 75 cc of contrast. FINDINGS: Aorta: Normal size. Minimal aortic root calcifications. No dissection. Aortic Valve:  Trileaflet.  No calcifications. Coronary Arteries:  Normal coronary origin.  Right dominance. RCA is a dominant artery that gives rise to PDA and PLA. There is no plaque. Left main is a large artery that gives rise to a Ramus, LAD and LCX arteries. LM and ramus are free of disease. LAD has no plaque. LCX is a non-dominant artery that gives rise to one OM1 branch. There is no plaque. Other findings: Normal pulmonary vein drainage into the left atrium. Normal left atrial appendage without a thrombus. Normal size of the pulmonary artery. IMPRESSION: 1. Normal coronary calcium score of 0. Patient is low risk for coronary events. 2.  Normal coronary origin with right dominance. 3.  No evidence of CAD. 4.  CAD-RADS 0. Consider non-atherosclerotic causes of chest pain. Electronically Signed: By: Debbe Odea M.D. On: 11/09/2020 17:53   DG Chest Portable 1 View  Result Date: 12/05/2020 CLINICAL DATA:  Altered mental status. EXAM: PORTABLE CHEST 1 VIEW COMPARISON:  06/04/2016 FINDINGS: The cardiac silhouette, mediastinal and hilar contours are within normal limits. The lungs are clear of an acute process. No infiltrates, edema or effusions. No pulmonary lesions. The bony thorax is intact. IMPRESSION: No acute cardiopulmonary findings. Electronically Signed   By: Rudie Meyer M.D.   On: 12/05/2020 20:03   EEG adult  Result Date: 12/06/2020 Jefferson Fuel, MD  12/06/2020  5:40 PM Routine EEG Report Mallie Linnemann is a 55 y.o. male with a history of altered mental status who is undergoing an EEG to evaluate for seizures. Report: This EEG was acquired with electrodes placed according to the International 10-20 electrode system (including Fp1, Fp2, F3, F4, C3, C4, P3, P4, O1, O2, T3, T4, T5, T6, A1, A2, Fz, Cz, Pz). The following electrodes were missing or displaced: none. There was no waking rhythm observed. The best background was 6-7 Hz with intermittent superimposed more pronounced diffuse slowing. This activity is reactive to stimulation. Sleep was manifested as K complexes and sleep spindles. There was no focal slowing. There were no interictal epileptiform discharges. There were no electrographic seizures identified. Photic stimulation and hyperventilation were not performed. Impression and clinical correlation: This EEG was obtained while sedated with ativan and asleep and is abnormal due to moderate diffuse slowing, indicative of global cerebral dysfunction. Bing Neighbors, MD [facility] [phone] If 7pm- 7am, please page neurology on call as listed in AMION.   CT Maxillofacial Wo Contrast  Result Date: 12/05/2020 CLINICAL DATA:  Delirium EXAM: CT HEAD WITHOUT CONTRAST CT MAXILLOFACIAL WITHOUT CONTRAST TECHNIQUE: Multidetector CT imaging of the head and maxillofacial structures were performed using the standard protocol without intravenous contrast. Multiplanar CT image reconstructions of the maxillofacial structures were also generated. COMPARISON:  None. FINDINGS: Motion degraded examination. Technologist reported patient combative during the exam. CT HEAD FINDINGS Brain: No evidence of acute infarction, hemorrhage, hydrocephalus, extra-axial collection or mass lesion/mass effect. Vascular: No hyperdense vessel or unexpected calcification.  Skull: Normal. Negative for fracture or focal lesion. Other: None. CT MAXILLOFACIAL FINDINGS Osseous: No acute maxillofacial bone fracture. Bony orbital walls are intact. Mandible intact. Temporomandibular joints are aligned without dislocation. Multifocal dental periapical lucencies. Orbits: Negative. No traumatic or inflammatory finding. Sinuses: Clear. Soft tissues: No soft tissue swelling or hematoma. IMPRESSION: 1. This limitation there is motion degraded examination. Within no evidence of an acute intracranial process. 2. No acute maxillofacial bone fracture. 3. Multifocal dental periapical lucencies. Electronically Signed   By: Duanne Guess D.O.   On: 12/05/2020 18:11    Catarina Hartshorn, DO  [facility]  If 7PM-7AM, please contact night-coverage [email or web address] Password [email or web address] 12/09/2020, 1:34 PM   LOS: 3 days

## 2020-12-10 LAB — CULTURE, BLOOD (ROUTINE X 2)
Culture: NO GROWTH
Culture: NO GROWTH
Special Requests: ADEQUATE

## 2020-12-10 LAB — MAGNESIUM: Magnesium: 1.9 mg/dL (ref 1.7–2.4)

## 2020-12-10 LAB — BASIC METABOLIC PANEL
Anion gap: 7 (ref 5–15)
BUN: 5 mg/dL — ABNORMAL LOW (ref 6–20)
CO2: 27 mmol/L (ref 22–32)
Calcium: 8.9 mg/dL (ref 8.9–10.3)
Chloride: 104 mmol/L (ref 98–111)
Creatinine, Ser: 0.85 mg/dL (ref 0.61–1.24)
GFR, Estimated: >60 mL/min (ref >60)
Glucose, Bld: 101 mg/dL — ABNORMAL HIGH (ref 70–99)
Potassium: 3.5 mmol/L (ref 3.5–5.1)
Sodium: 138 mmol/L (ref 135–145)

## 2020-12-10 LAB — CK: Total CK: 1671 U/L — ABNORMAL HIGH (ref 49–397)

## 2020-12-10 MED ORDER — POTASSIUM CHLORIDE IN NACL 20-0.9 MEQ/L-% IV SOLN: INTRAVENOUS | Status: DC

## 2020-12-10 NOTE — Plan of Care (Signed)

## 2020-12-10 NOTE — TOC Progression Note (Signed)
Transition of Care [facility]) - Progression Note    Patient Details  Name: Adeoluwa Silvers MRN: [id number] Date of Birth: 1965-07-06  Transition of Care [facility]) CM/SW Contact  Barry Brunner, LCSW Phone Number: 12/10/2020, 4:13 PM  Clinical Narrative:    Patient's brother in law reported that patient is now agreeable to SNF. Patient's son reported that patient will not have the assistance of his brother if discharged home as previously discussed due to patient's brother's move 4 hours away. Patient is not able to complete ADL independently and requires mod to max assist. MD notified. PT to reaccess patient tomorrow. TOC to follow.   Expected Discharge Plan: Home w Home Health Services Barriers to Discharge: Continued Medical Work up  Expected Discharge Plan and Services Expected Discharge Plan: Home w Home Health Services     Post Acute Care Choice: Home Health Living arrangements for the past 2 months: Single Family Home                                       Social Determinants of Health (SDOH) Interventions    Readmission Risk Interventions Readmission Risk Prevention Plan 12/08/2020  Transportation Screening Complete  HRI or Home Care Consult Complete  Social Work Consult for Recovery Care Planning/Counseling Complete  Palliative Care Screening Not Applicable  Medication Review Oceanographer) Complete  Some recent data might be hidden

## 2020-12-10 NOTE — Plan of Care (Signed)

## 2020-12-10 NOTE — Progress Notes (Signed)
At shift change I was notified that patient's IV had infiltrated and he did not have one. I brought supplies into his room fr an IV and he refused asking for a PICC line because he had had 8 IV since he was admitted. I told him I would request a PICC line from the doctor.

## 2020-12-10 NOTE — Progress Notes (Addendum)
PROGRESS NOTE  Jared Li ZDG:[id number] DOB: November 02, 1965 DOA: 12/05/2020 PCP: Benita Stabile, MD  Brief History:  55 year old with history of adjustment disorder, cardiopulmonary syndrome, cervical radiculopathy, chronic intractable pain, GERD, cauda equina syndrome lumbar postlaminectomy, tobacco use presents to the ED with altered mental status.  Initially patient was very agitated, typically uses large amounts of Dilaudid and oxycodone for chronic back pain.  Upon admission she was febrile, tachycardic and hypertensive.  COVID-19 negative.  Chest x-ray unremarkable, UDS positive for marijuana and opioids.  CT head did not show any acute pathology but showed multifocal dental periapical lucencies.  CT abdomen pelvis was questionable for bilateral pyelonephritis.  Patient was admitted for agitation, sepsis.  Initially patient started on Precedex drip which was weaned off over 24 hours as his mentation was improving.  His sepsis physiology was improving and he was tolerating orals therefore antibiotics were changed to oral Augmentin.     Assessment/Plan:   Acute metabolic encephalopathy, improved - This is improved.  Combination of withdrawal and underlying infection. -CT head negative, shows periapical dental lucencies -EEG shows generalized slowing but no seizure activity - He has been weaned off Precedex drip. -9/25---mental status at baseline per family at bedside   Sepsis secondary to acute pyelonephritis with concerns of aspiration pneumonia - Cultures are negative.  Procalcitonin 0.89.  On oral Augmentin, last day 9/26.   Rhabdomyolysis - [id number]>>>[id number]>>[id number] -continue IVF   Hypokalemia - Replete    Chronic pain, opiate dependence - At home he is on hydromorphone 16 mg twice daily, Roxicodone 15 mg every 4 hours as needed. - Now he is off Precedex - his off his hydromorphone and pain is controlled   GERD - PPI at home.   Adjustment disorder - On Zoloft at  home   Tobacco use - As needed bronchodilators   Essential hypertension, uncontrolled - Increase clonidine to 0.2 mg twice daily.  IV hydralazine as needed   Social -family requests repeat PT eval -they have concern regarding patient's ability to care for himself as pt lives alone in the setting of his deconditioned state         Status is: Inpatient   Remains inpatient appropriate because:Inpatient level of care appropriate due to severity of illness   Dispo: The patient is from: Home              Anticipated d/c is to: SNF              Patient currently is not medically stable to d/c.              Difficult to place patient No            Total time spent 35 minutes.  Greater than 50% spent face to face counseling and coordinating care.    Family Communication:   Brother and sister at bedside 9/25   Consultants:  none   Code Status:  FULL    DVT Prophylaxis:  [place] Heparin      Procedures: As Listed in Progress Note Above   Antibiotics: None    Subjective: Patient denies fevers, chills, headache, chest pain, dyspnea, nausea, vomiting, diarrhea, abdominal pain, dysuria, hematuria, hematochezia, and melena.   Objective: Vitals:   12/09/20 0927 12/09/20 1459 12/09/20 2104 12/10/20 0556  BP: (!) 167/96 (!) 161/108 (!) 158/112 (!) 159/118  Pulse:  88 75 71  Resp:   20 19  Temp:   99.2 F (  37.3 C) 97.7 F (36.5 C)  TempSrc:   Oral Oral  SpO2:   100% 96%  Weight:      Height:        Intake/Output Summary (Last 24 hours) at 12/10/2020 1348 Last data filed at 12/10/2020 0845 Gross per 24 hour  Intake 757.85 ml  Output 1700 ml  Net -942.15 ml   Weight change:  Exam:  General:  Pt is alert, follows commands appropriately, not in acute distress HEENT: No icterus, No thrush, No neck mass, [place]/AT Cardiovascular: RRR, S1/S2, no rubs, no gallops Respiratory: bibasilar crackles. No wheeze Abdomen: Soft/+BS, non tender, non distended, no guarding Extremities:  No edema, No lymphangitis, No petechiae, No rashes, no synovitis   Data Reviewed: I have personally reviewed following labs and imaging studies Basic Metabolic Panel: Recent Labs  Lab 12/06/20 0422 12/07/20 0442 12/08/20 0531 12/09/20 0551 12/10/20 0528  NA 137 135 138 141 138  K 3.2* 3.4* 2.9* 4.1 3.5  CL 103 105 104 108 104  CO2 23 24 26 26 27   GLUCOSE 116* 91 103* 87 101*  BUN 9 10 5* <5* 5*  CREATININE 0.91 0.91 0.89 0.81 0.85  CALCIUM 8.6* 7.9* 8.4* 8.7* 8.9  MG 2.1 1.9 1.8 1.9 1.9  PHOS  --   --  2.5  --   --    Liver Function Tests: Recent Labs  Lab 12/05/20 1748 12/06/20 0422  AST 32 34  ALT 17 17  ALKPHOS 178* 140*  BILITOT 0.8 0.8  PROT 9.1* 7.5  ALBUMIN 4.9 3.9   No results for input(s): LIPASE, AMYLASE in the last 168 hours. No results for input(s): AMMONIA in the last 168 hours. Coagulation Profile: Recent Labs  Lab 12/06/20 0422  INR 1.3*   CBC: Recent Labs  Lab 12/05/20 1748 12/06/20 0422 12/07/20 0442 12/08/20 0531 12/09/20 0551  WBC 25.5* 19.7* 11.3* 8.2 7.5  NEUTROABS 20.8* 15.1*  --   --   --   HGB 19.1* 17.3* 14.0 13.8 14.5  HCT 55.0* 50.6 42.7 42.1 44.1  MCV 96.3 97.3 99.5 98.8 101.1*  PLT 256 198 149* 155 176   Cardiac Enzymes: Recent Labs  Lab 12/06/20 0422 12/07/20 0442 12/08/20 0531 12/09/20 0551 12/10/20 0528  CKTOTAL 970* 1,534* 4,888* 3,529*  3,523* 1,671*   BNP: Invalid input(s): POCBNP CBG: No results for input(s): GLUCAP in the last 168 hours. HbA1C: No results for input(s): HGBA1C in the last 72 hours. Urine analysis:    Component Value Date/Time   COLORURINE YELLOW 12/05/2020 1925   APPEARANCEUR CLEAR 12/05/2020 1925   LABSPEC >1.030 (H) 12/05/2020 1925   PHURINE 6.0 12/05/2020 1925   GLUCOSEU 100 (A) 12/05/2020 1925   HGBUR LARGE (A) 12/05/2020 1925   BILIRUBINUR NEGATIVE 12/05/2020 1925   KETONESUR NEGATIVE 12/05/2020 1925   PROTEINUR 100 (A) 12/05/2020 1925   NITRITE NEGATIVE 12/05/2020 1925    LEUKOCYTESUR NEGATIVE 12/05/2020 1925   Sepsis Labs: @LABRCNTIP (procalcitonin:4,lacticidven:4) ) Recent Results (from the past 240 hour(s))  Urine Culture     Status: None   Collection Time: 12/05/20  7:23 PM   Specimen: In/Out Cath Urine  Result Value Ref Range Status   Specimen Description   Final    IN/OUT CATH URINE Performed at [facility], [address], [place], [place] [address]    Special Requests   Final    NONE Performed at [facility], [address], [place], [place] [address]    Culture   Final  NO GROWTH Performed at [facility], [address], [place], [place] [address]    Report Status 12/07/2020 FINAL  Final  Culture, blood (routine x 2)     Status: None   Collection Time: 12/05/20  8:06 PM   Specimen: BLOOD RIGHT HAND  Result Value Ref Range Status   Specimen Description BLOOD RIGHT HAND  Final   Special Requests   Final    Blood Culture adequate volume BOTTLES DRAWN AEROBIC AND ANAEROBIC   Culture   Final    NO GROWTH 5 DAYS Performed at [facility], [address], [place], [place] [address]    Report Status 12/10/2020 FINAL  Final  Culture, blood (routine x 2)     Status: None   Collection Time: 12/05/20  8:06 PM   Specimen: BLOOD LEFT FOREARM  Result Value Ref Range Status   Specimen Description BLOOD LEFT FOREARM  Final   Special Requests   Final    Blood Culture results may not be optimal due to an excessive volume of blood received in culture bottles BOTTLES DRAWN AEROBIC AND ANAEROBIC   Culture   Final    NO GROWTH 5 DAYS Performed at [facility], [address], [place], [place] [address]    Report Status 12/10/2020 FINAL  Final  Resp Panel by RT-PCR (Flu A&B, Covid) Nasopharyngeal Swab     Status: None   Collection Time: 12/05/20  8:13 PM   Specimen: Nasopharyngeal Swab; Nasopharyngeal(NP) swabs in vial transport medium  Result Value Ref Range Status   SARS Coronavirus 2 by RT PCR NEGATIVE NEGATIVE Final    Comment:  (NOTE) SARS-CoV-2 target nucleic acids are NOT DETECTED.  The SARS-CoV-2 RNA is generally detectable in upper respiratory specimens during the acute phase of infection. The lowest concentration of SARS-CoV-2 viral copies this assay can detect is 138 copies/mL. A negative result does not preclude SARS-Cov-2 infection and should not be used as the sole basis for treatment or other patient management decisions. A negative result may occur with  improper specimen collection/handling, submission of specimen other than nasopharyngeal swab, presence of viral mutation(s) within the areas targeted by this assay, and inadequate number of viral copies(<138 copies/mL). A negative result must be combined with clinical observations, patient history, and epidemiological information. The expected result is Negative.  Fact Sheet for Patients:  BloggerCourse.com  Fact Sheet for Healthcare Providers:  SeriousBroker.it  This test is no t yet approved or cleared by the [place] FDA and  has been authorized for detection and/or diagnosis of SARS-CoV-2 by FDA under an Emergency Use Authorization (EUA). This EUA will remain  in effect (meaning this test can be used) for the duration of the COVID-19 declaration under Section 564(b)(1) of the Act, 21 U.S.C.section 360bbb-3(b)(1), unless the authorization is terminated  or revoked sooner.       Influenza A by PCR NEGATIVE NEGATIVE Final   Influenza B by PCR NEGATIVE NEGATIVE Final    Comment: (NOTE) The Xpert Xpress SARS-CoV-2/FLU/RSV plus assay is intended as an aid in the diagnosis of influenza from Nasopharyngeal swab specimens and should not be used as a sole basis for treatment. Nasal washings and aspirates are unacceptable for Xpert Xpress SARS-CoV-2/FLU/RSV testing.  Fact Sheet for Patients: BloggerCourse.com  Fact Sheet for Healthcare  Providers: SeriousBroker.it  This test is not yet approved or cleared by the [place] FDA and has been authorized for detection and/or diagnosis of SARS-CoV-2 by FDA under an Emergency Use Authorization (EUA).  This EUA will remain in effect (meaning this test can be used) for the duration of the COVID-19 declaration under Section 564(b)(1) of the Act, 21 U.S.C. section 360bbb-3(b)(1), unless the authorization is terminated or revoked.  Performed at [facility], [address] [place], [place] [address]   MRSA Next Gen by PCR, Nasal     Status: None   Collection Time: 12/06/20  2:46 AM   Specimen: Nasal Mucosa; Nasal Swab  Result Value Ref Range Status   MRSA by PCR Next Gen NOT DETECTED NOT DETECTED Final    Comment: (NOTE) The GeneXpert MRSA Assay (FDA approved for NASAL specimens only), is one component of a comprehensive MRSA colonization surveillance program. It is not intended to diagnose MRSA infection nor to guide or monitor treatment for MRSA infections. Test performance is not FDA approved in patients less than 43 years old. Performed at [facility], [address], [place], [place] [address]      Scheduled Meds:  amoxicillin-clavulanate  1 tablet Oral Q12H   vitamin C  500 mg Oral BID   cloNIDine  0.2 mg Oral BID   dronabinol  5 mg Oral BID AC   feeding supplement   Oral QID   folic acid  1 mg Oral Daily   heparin  5,000 Units Subcutaneous Q8H   imipramine  10 mg Oral QHS   multivitamin with minerals  1 tablet Oral Daily   pantoprazole  40 mg Oral Daily   sertraline  100 mg Oral QHS   thiamine  100 mg Oral Daily   Or   thiamine  100 mg Intravenous Daily   zinc sulfate  220 mg Oral Daily   Continuous Infusions:  sodium chloride 150 mL/hr at 12/09/20 1525   dexmedetomidine (PRECEDEX) IV infusion Stopped (12/06/20 1900)    Procedures/Studies: CT HEAD WO CONTRAST ( )  Result Date: 12/05/2020 CLINICAL DATA:  Delirium EXAM: CT  HEAD WITHOUT CONTRAST CT MAXILLOFACIAL WITHOUT CONTRAST TECHNIQUE: Multidetector CT imaging of the head and maxillofacial structures were performed using the standard protocol without intravenous contrast. Multiplanar CT image reconstructions of the maxillofacial structures were also generated. COMPARISON:  None. FINDINGS: Motion degraded examination. Technologist reported patient combative during the exam. CT HEAD FINDINGS Brain: No evidence of acute infarction, hemorrhage, hydrocephalus, extra-axial collection or mass lesion/mass effect. Vascular: No hyperdense vessel or unexpected calcification. Skull: Normal. Negative for fracture or focal lesion. Other: None. CT MAXILLOFACIAL FINDINGS Osseous: No acute maxillofacial bone fracture. Bony orbital walls are intact. Mandible intact. Temporomandibular joints are aligned without dislocation. Multifocal dental periapical lucencies. Orbits: Negative. No traumatic or inflammatory finding. Sinuses: Clear. Soft tissues: No soft tissue swelling or hematoma. IMPRESSION: 1. This limitation there is motion degraded examination. Within no evidence of an acute intracranial process. 2. No acute maxillofacial bone fracture. 3. Multifocal dental periapical lucencies. Electronically Signed   By: Duanne Guess D.O.   On: 12/05/2020 18:11   CT ABDOMEN PELVIS W CONTRAST  Result Date: 12/05/2020 CLINICAL DATA:  Abdominal pain with fever. EXAM: CT ABDOMEN AND PELVIS WITH CONTRAST TECHNIQUE: Multidetector CT imaging of the abdomen and pelvis was performed using the standard protocol following bolus administration of intravenous contrast. CONTRAST:  80mL OMNIPAQUE IOHEXOL 350 MG/ML SOLN COMPARISON:  None. FINDINGS: Lower chest: No acute abnormality. Hepatobiliary: No focal liver abnormality is seen. No gallstones, gallbladder wall thickening, or biliary dilatation. Pancreas: Unremarkable. No pancreatic ductal dilatation or surrounding inflammatory changes. Spleen: Normal in size  without focal abnormality. Adrenals/Urinary Tract: There is limited  evaluation secondary to motion artifact. There are questionable areas of subtle patchy hypodensity throughout the bilateral renal parenchyma, although this may be related to streak artifact. Kidneys are normal in size and contour. There is no hydronephrosis or perinephric fluid. The bladder is within normal limits. Stomach/Bowel: Stomach is within normal limits. Appendix appears normal. No evidence of bowel wall thickening, distention, or inflammatory changes. Vascular/Lymphatic: Aortic atherosclerosis. No enlarged abdominal or pelvic lymph nodes. Reproductive: Not well evaluated secondary to motion artifact. Other: No abdominal wall hernia or abnormality. No abdominopelvic ascites. Musculoskeletal: L4-S1 posterior fusion hardware is present and appears uncomplicated. No acute fractures are seen. IMPRESSION: 1. Questionable patchy areas of hypodensity throughout the bilateral renal parenchyma which would be concerning for pyelonephritis. Note is made that there is artifact in this region. Please correlate clinically. No hydronephrosis or perinephric fluid collection. 2.  Aortic Atherosclerosis (ICD10-I70.0). Electronically Signed   By: Darliss Cheney M.D.   On: 12/05/2020 23:40   DG Chest Portable 1 View  Result Date: 12/05/2020 CLINICAL DATA:  Altered mental status. EXAM: PORTABLE CHEST 1 VIEW COMPARISON:  06/04/2016 FINDINGS: The cardiac silhouette, mediastinal and hilar contours are within normal limits. The lungs are clear of an acute process. No infiltrates, edema or effusions. No pulmonary lesions. The bony thorax is intact. IMPRESSION: No acute cardiopulmonary findings. Electronically Signed   By: Rudie Meyer M.D.   On: 12/05/2020 20:03   EEG adult  Result Date: 12/06/2020 Jefferson Fuel, MD     12/06/2020  5:40 PM Routine EEG Report Aikam Vinje is a 55 y.o. male with a history of altered mental status who is undergoing an EEG  to evaluate for seizures. Report: This EEG was acquired with electrodes placed according to the International 10-20 electrode system (including Fp1, Fp2, F3, F4, C3, C4, P3, P4, O1, O2, T3, T4, T5, T6, A1, A2, Fz, Cz, Pz). The following electrodes were missing or displaced: none. There was no waking rhythm observed. The best background was 6-7 Hz with intermittent superimposed more pronounced diffuse slowing. This activity is reactive to stimulation. Sleep was manifested as K complexes and sleep spindles. There was no focal slowing. There were no interictal epileptiform discharges. There were no electrographic seizures identified. Photic stimulation and hyperventilation were not performed. Impression and clinical correlation: This EEG was obtained while sedated with ativan and asleep and is abnormal due to moderate diffuse slowing, indicative of global cerebral dysfunction. Bing Neighbors, MD [facility] [phone] If 7pm- 7am, please page neurology on call as listed in AMION.   CT Maxillofacial Wo Contrast  Result Date: 12/05/2020 CLINICAL DATA:  Delirium EXAM: CT HEAD WITHOUT CONTRAST CT MAXILLOFACIAL WITHOUT CONTRAST TECHNIQUE: Multidetector CT imaging of the head and maxillofacial structures were performed using the standard protocol without intravenous contrast. Multiplanar CT image reconstructions of the maxillofacial structures were also generated. COMPARISON:  None. FINDINGS: Motion degraded examination. Technologist reported patient combative during the exam. CT HEAD FINDINGS Brain: No evidence of acute infarction, hemorrhage, hydrocephalus, extra-axial collection or mass lesion/mass effect. Vascular: No hyperdense vessel or unexpected calcification. Skull: Normal. Negative for fracture or focal lesion. Other: None. CT MAXILLOFACIAL FINDINGS Osseous: No acute maxillofacial bone fracture. Bony orbital walls are intact. Mandible intact. Temporomandibular joints are aligned without  dislocation. Multifocal dental periapical lucencies. Orbits: Negative. No traumatic or inflammatory finding. Sinuses: Clear. Soft tissues: No soft tissue swelling or hematoma. IMPRESSION: 1. This limitation there is motion degraded examination. Within no evidence of an acute intracranial process. 2. No acute maxillofacial  bone fracture. 3. Multifocal dental periapical lucencies. Electronically Signed   By: Duanne Guess D.O.   On: 12/05/2020 18:11    Catarina Hartshorn, DO  [facility]  If 7PM-7AM, please contact night-coverage [email or web address] Password [email or web address] 12/10/2020, 1:48 PM   LOS: 4 days

## 2020-12-11 LAB — BASIC METABOLIC PANEL
Anion gap: 5 (ref 5–15)
BUN: 5 mg/dL — ABNORMAL LOW (ref 6–20)
CO2: 28 mmol/L (ref 22–32)
Calcium: 8.6 mg/dL — ABNORMAL LOW (ref 8.9–10.3)
Chloride: 105 mmol/L (ref 98–111)
Creatinine, Ser: 0.89 mg/dL (ref 0.61–1.24)
GFR, Estimated: >60 mL/min (ref >60)
Glucose, Bld: 88 mg/dL (ref 70–99)
Potassium: 3.5 mmol/L (ref 3.5–5.1)
Sodium: 138 mmol/L (ref 135–145)

## 2020-12-11 LAB — CK: Total CK: 329 U/L (ref 49–397)

## 2020-12-11 LAB — MAGNESIUM: Magnesium: 1.9 mg/dL (ref 1.7–2.4)

## 2020-12-11 MED ORDER — POTASSIUM CHLORIDE CRYS ER 20 MEQ PO TBCR
20.0000 meq | EXTENDED_RELEASE_TABLET | Freq: Once | ORAL | Status: AC
  Administered 2020-12-11: 20 meq via ORAL
  Filled 2020-12-11: qty 1

## 2020-12-11 NOTE — Progress Notes (Signed)
PROGRESS NOTE  Jared Li ZOX:[id number] DOB: 06-Sep-1965 DOA: 12/05/2020 PCP: Benita Stabile, MD     Brief History:  55 year old with history of adjustment disorder, cardiopulmonary syndrome, cervical radiculopathy, chronic intractable pain, GERD, cauda equina syndrome lumbar postlaminectomy, tobacco use presents to the ED with altered mental status.  Initially patient was very agitated, typically uses large amounts of Dilaudid and oxycodone for chronic back pain.  Upon admission she was febrile, tachycardic and hypertensive.  COVID-19 negative.  Chest x-ray unremarkable, UDS positive for marijuana and opioids.  CT head did not show any acute pathology but showed multifocal dental periapical lucencies.  CT abdomen pelvis was questionable for bilateral pyelonephritis.  Patient was admitted for agitation, sepsis.  Initially patient started on Precedex drip which was weaned off over 24 hours as his mentation was improving.  His sepsis physiology was improving and he was tolerating orals therefore antibiotics were changed to oral Augmentin.     Assessment/Plan:   Acute metabolic encephalopathy, improved - This is improved.  Combination of withdrawal and underlying infection. -CT head negative, shows periapical dental lucencies -EEG shows generalized slowing but no seizure activity - He has been weaned off Precedex drip. -9/25---mental status at baseline per family at bedside   Sepsis secondary to acute pyelonephritis with concerns of aspiration pneumonia - Cultures are negative.  Procalcitonin 0.89.  On oral Augmentin, last day 9/26.   Rhabdomyolysis - 4888>>>[id number]>>[id number]>>329 -continue IVF   Hypokalemia - Repleted   Chronic pain, opiate dependence - At home he is on hydromorphone 16 mg twice daily, Roxicodone 15 mg every 4 hours as needed. - Now he is off Precedex - his off his hydromorphone and pain is controlled   GERD - PPI at home.   Adjustment disorder - On  Zoloft at home   Tobacco use - As needed bronchodilators   Essential hypertension, uncontrolled - Increase clonidine to 0.2 mg twice daily.  IV hydralazine as needed   Social -family requests repeat PT eval>>SNF -they have concern regarding patient's ability to care for himself as pt lives alone in the setting of his deconditioned state         Status is: Inpatient   Remains inpatient appropriate because:Inpatient level of care appropriate due to severity of illness   Dispo: The patient is from: Home              Anticipated d/c is to: SNF              Patient currently is not medically stable to d/c.              Difficult to place patient No            Total time spent 35 minutes.  Greater than 50% spent face to face counseling and coordinating care.     Family Communication:   Brother and sister at bedside 9/25   Consultants:  none   Code Status:  FULL    DVT Prophylaxis:  Foard Heparin      Procedures: As Listed in Progress Note Above   Antibiotics: None Subjective: Patient denies fevers, chills, headache, chest pain, dyspnea, nausea, vomiting, diarrhea, abdominal pain, dysuria, hematuria, hematochezia, and melena.   Objective: Vitals:   12/10/20 0556 12/10/20 1358 12/10/20 2013 12/11/20 0526  BP: (!) 159/118 (!) 131/93 (!) 152/93 (!) 146/88  Pulse: 71 80 76 68  Resp: 19 20 19 19   Temp: 97.7 F (36.5 C)  98.2 F (36.8 C) 98.5 F (36.9 C) 97.8 F (36.6 C)  TempSrc: Oral Oral Oral Oral  SpO2: 96% 98% 99% 98%  Weight:      Height:        Intake/Output Summary (Last 24 hours) at 12/11/2020 1304 Last data filed at 12/11/2020 4268 Gross per 24 hour  Intake 1806.2 ml  Output 675 ml  Net 1131.2 ml   Weight change:  Exam:  General:  Pt is alert, follows commands appropriately, not in acute distress HEENT: No icterus, No thrush, No neck mass, [place]/AT Cardiovascular: RRR, S1/S2, no rubs, no gallops Respiratory: bibasilar crackles. No wheeze Abdomen:  Soft/+BS, non tender, non distended, no guarding Extremities: No edema, No lymphangitis, No petechiae, No rashes, no synovitis   Data Reviewed: I have personally reviewed following labs and imaging studies Basic Metabolic Panel: Recent Labs  Lab 12/07/20 0442 12/08/20 0531 12/09/20 0551 12/10/20 0528 12/11/20 0532  NA 135 138 141 138 138  K 3.4* 2.9* 4.1 3.5 3.5  CL 105 104 108 104 105  CO2 24 26 26 27 28   GLUCOSE 91 103* 87 101* 88  BUN 10 5* <5* 5* 5*  CREATININE 0.91 0.89 0.81 0.85 0.89  CALCIUM 7.9* 8.4* 8.7* 8.9 8.6*  MG 1.9 1.8 1.9 1.9 1.9  PHOS  --  2.5  --   --   --    Liver Function Tests: Recent Labs  Lab 12/05/20 1748 12/06/20 0422  AST 32 34  ALT 17 17  ALKPHOS 178* 140*  BILITOT 0.8 0.8  PROT 9.1* 7.5  ALBUMIN 4.9 3.9   No results for input(s): LIPASE, AMYLASE in the last 168 hours. No results for input(s): AMMONIA in the last 168 hours. Coagulation Profile: Recent Labs  Lab 12/06/20 0422  INR 1.3*   CBC: Recent Labs  Lab 12/05/20 1748 12/06/20 0422 12/07/20 0442 12/08/20 0531 12/09/20 0551  WBC 25.5* 19.7* 11.3* 8.2 7.5  NEUTROABS 20.8* 15.1*  --   --   --   HGB 19.1* 17.3* 14.0 13.8 14.5  HCT 55.0* 50.6 42.7 42.1 44.1  MCV 96.3 97.3 99.5 98.8 101.1*  PLT 256 198 149* 155 176   Cardiac Enzymes: Recent Labs  Lab 12/07/20 0442 12/08/20 0531 12/09/20 0551 12/10/20 0528 12/11/20 0532  CKTOTAL 1,534* 4,888* 3,529*  3,523* 1,671* 329   BNP: Invalid input(s): POCBNP CBG: No results for input(s): GLUCAP in the last 168 hours. HbA1C: No results for input(s): HGBA1C in the last 72 hours. Urine analysis:    Component Value Date/Time   COLORURINE YELLOW 12/05/2020 1925   APPEARANCEUR CLEAR 12/05/2020 1925   LABSPEC >1.030 (H) 12/05/2020 1925   PHURINE 6.0 12/05/2020 1925   GLUCOSEU 100 (A) 12/05/2020 1925   HGBUR LARGE (A) 12/05/2020 1925   BILIRUBINUR NEGATIVE 12/05/2020 1925   KETONESUR NEGATIVE 12/05/2020 1925   PROTEINUR  100 (A) 12/05/2020 1925   NITRITE NEGATIVE 12/05/2020 1925   LEUKOCYTESUR NEGATIVE 12/05/2020 1925   Sepsis Labs: @LABRCNTIP (procalcitonin:4,lacticidven:4) ) Recent Results (from the past 240 hour(s))  Urine Culture     Status: None   Collection Time: 12/05/20  7:23 PM   Specimen: In/Out Cath Urine  Result Value Ref Range Status   Specimen Description   Final    IN/OUT CATH URINE Performed at [facility], [address], [place], [address] [place]    Special Requests   Final    NONE Performed at [facility], [address] [place], [address] [place]    Culture  Final    NO GROWTH Performed at [facility], [address] [place], [place] [address]    Report Status 12/07/2020 FINAL  Final  Culture, blood (routine x 2)     Status: None   Collection Time: 12/05/20  8:06 PM   Specimen: BLOOD RIGHT HAND  Result Value Ref Range Status   Specimen Description BLOOD RIGHT HAND  Final   Special Requests   Final    Blood Culture adequate volume BOTTLES DRAWN AEROBIC AND ANAEROBIC   Culture   Final    NO GROWTH 5 DAYS Performed at [facility], [address], [place], [place] [address]    Report Status 12/10/2020 FINAL  Final  Culture, blood (routine x 2)     Status: None   Collection Time: 12/05/20  8:06 PM   Specimen: BLOOD LEFT FOREARM  Result Value Ref Range Status   Specimen Description BLOOD LEFT FOREARM  Final   Special Requests   Final    Blood Culture results may not be optimal due to an excessive volume of blood received in culture bottles BOTTLES DRAWN AEROBIC AND ANAEROBIC   Culture   Final    NO GROWTH 5 DAYS Performed at [facility], [address], [place], [place] [address]    Report Status 12/10/2020 FINAL  Final  Resp Panel by RT-PCR (Flu A&B, Covid) Nasopharyngeal Swab     Status: None   Collection Time: 12/05/20  8:13 PM   Specimen: Nasopharyngeal Swab; Nasopharyngeal(NP) swabs in vial transport medium  Result Value Ref Range Status   SARS  Coronavirus 2 by RT PCR NEGATIVE NEGATIVE Final    Comment: (NOTE) SARS-CoV-2 target nucleic acids are NOT DETECTED.  The SARS-CoV-2 RNA is generally detectable in upper respiratory specimens during the acute phase of infection. The lowest concentration of SARS-CoV-2 viral copies this assay can detect is 138 copies/mL. A negative result does not preclude SARS-Cov-2 infection and should not be used as the sole basis for treatment or other patient management decisions. A negative result may occur with  improper specimen collection/handling, submission of specimen other than nasopharyngeal swab, presence of viral mutation(s) within the areas targeted by this assay, and inadequate number of viral copies(<138 copies/mL). A negative result must be combined with clinical observations, patient history, and epidemiological information. The expected result is Negative.  Fact Sheet for Patients:  BloggerCourse.com  Fact Sheet for Healthcare Providers:  SeriousBroker.it  This test is no t yet approved or cleared by the [place] FDA and  has been authorized for detection and/or diagnosis of SARS-CoV-2 by FDA under an Emergency Use Authorization (EUA). This EUA will remain  in effect (meaning this test can be used) for the duration of the COVID-19 declaration under Section 564(b)(1) of the Act, 21 U.S.C.section 360bbb-3(b)(1), unless the authorization is terminated  or revoked sooner.       Influenza A by PCR NEGATIVE NEGATIVE Final   Influenza B by PCR NEGATIVE NEGATIVE Final    Comment: (NOTE) The Xpert Xpress SARS-CoV-2/FLU/RSV plus assay is intended as an aid in the diagnosis of influenza from Nasopharyngeal swab specimens and should not be used as a sole basis for treatment. Nasal washings and aspirates are unacceptable for Xpert Xpress SARS-CoV-2/FLU/RSV testing.  Fact Sheet for  Patients: BloggerCourse.com  Fact Sheet for Healthcare Providers: SeriousBroker.it  This test is not yet approved or cleared by the [place] FDA and has been authorized for detection and/or diagnosis of SARS-CoV-2 by FDA under an  Emergency Use Authorization (EUA). This EUA will remain in effect (meaning this test can be used) for the duration of the COVID-19 declaration under Section 564(b)(1) of the Act, 21 U.S.C. section 360bbb-3(b)(1), unless the authorization is terminated or revoked.  Performed at [facility], [address], [place], [place] [address]   MRSA Next Gen by PCR, Nasal     Status: None   Collection Time: 12/06/20  2:46 AM   Specimen: Nasal Mucosa; Nasal Swab  Result Value Ref Range Status   MRSA by PCR Next Gen NOT DETECTED NOT DETECTED Final    Comment: (NOTE) The GeneXpert MRSA Assay (FDA approved for NASAL specimens only), is one component of a comprehensive MRSA colonization surveillance program. It is not intended to diagnose MRSA infection nor to guide or monitor treatment for MRSA infections. Test performance is not FDA approved in patients less than 20 years old. Performed at [facility], [address], [place], [place] [address]      Scheduled Meds:  amoxicillin-clavulanate  1 tablet Oral Q12H   vitamin C  500 mg Oral BID   cloNIDine  0.2 mg Oral BID   dronabinol  5 mg Oral BID AC   feeding supplement   Oral QID   folic acid  1 mg Oral Daily   heparin  5,000 Units Subcutaneous Q8H   imipramine  10 mg Oral QHS   multivitamin with minerals  1 tablet Oral Daily   pantoprazole  40 mg Oral Daily   sertraline  100 mg Oral QHS   thiamine  100 mg Oral Daily   Or   thiamine  100 mg Intravenous Daily   zinc sulfate  220 mg Oral Daily   Continuous Infusions:  0.9 % NaCl with KCl 20 mEq / L 125 mL/hr at 12/10/20 2310   dexmedetomidine (PRECEDEX) IV infusion Stopped (12/06/20 1900)     Procedures/Studies: CT HEAD WO CONTRAST ( )  Result Date: 12/05/2020 CLINICAL DATA:  Delirium EXAM: CT HEAD WITHOUT CONTRAST CT MAXILLOFACIAL WITHOUT CONTRAST TECHNIQUE: Multidetector CT imaging of the head and maxillofacial structures were performed using the standard protocol without intravenous contrast. Multiplanar CT image reconstructions of the maxillofacial structures were also generated. COMPARISON:  None. FINDINGS: Motion degraded examination. Technologist reported patient combative during the exam. CT HEAD FINDINGS Brain: No evidence of acute infarction, hemorrhage, hydrocephalus, extra-axial collection or mass lesion/mass effect. Vascular: No hyperdense vessel or unexpected calcification. Skull: Normal. Negative for fracture or focal lesion. Other: None. CT MAXILLOFACIAL FINDINGS Osseous: No acute maxillofacial bone fracture. Bony orbital walls are intact. Mandible intact. Temporomandibular joints are aligned without dislocation. Multifocal dental periapical lucencies. Orbits: Negative. No traumatic or inflammatory finding. Sinuses: Clear. Soft tissues: No soft tissue swelling or hematoma. IMPRESSION: 1. This limitation there is motion degraded examination. Within no evidence of an acute intracranial process. 2. No acute maxillofacial bone fracture. 3. Multifocal dental periapical lucencies. Electronically Signed   By: Duanne Guess D.O.   On: 12/05/2020 18:11   CT ABDOMEN PELVIS W CONTRAST  Result Date: 12/05/2020 CLINICAL DATA:  Abdominal pain with fever. EXAM: CT ABDOMEN AND PELVIS WITH CONTRAST TECHNIQUE: Multidetector CT imaging of the abdomen and pelvis was performed using the standard protocol following bolus administration of intravenous contrast. CONTRAST:  14mL OMNIPAQUE IOHEXOL 350 MG/ML SOLN COMPARISON:  None. FINDINGS: Lower chest: No acute abnormality. Hepatobiliary: No focal liver abnormality is seen. No gallstones, gallbladder wall thickening, or biliary dilatation.  Pancreas: Unremarkable. No pancreatic ductal dilatation or surrounding inflammatory changes. Spleen:  Normal in size without focal abnormality. Adrenals/Urinary Tract: There is limited evaluation secondary to motion artifact. There are questionable areas of subtle patchy hypodensity throughout the bilateral renal parenchyma, although this may be related to streak artifact. Kidneys are normal in size and contour. There is no hydronephrosis or perinephric fluid. The bladder is within normal limits. Stomach/Bowel: Stomach is within normal limits. Appendix appears normal. No evidence of bowel wall thickening, distention, or inflammatory changes. Vascular/Lymphatic: Aortic atherosclerosis. No enlarged abdominal or pelvic lymph nodes. Reproductive: Not well evaluated secondary to motion artifact. Other: No abdominal wall hernia or abnormality. No abdominopelvic ascites. Musculoskeletal: L4-S1 posterior fusion hardware is present and appears uncomplicated. No acute fractures are seen. IMPRESSION: 1. Questionable patchy areas of hypodensity throughout the bilateral renal parenchyma which would be concerning for pyelonephritis. Note is made that there is artifact in this region. Please correlate clinically. No hydronephrosis or perinephric fluid collection. 2.  Aortic Atherosclerosis (ICD10-I70.0). Electronically Signed   By: Darliss Cheney M.D.   On: 12/05/2020 23:40   DG Chest Portable 1 View  Result Date: 12/05/2020 CLINICAL DATA:  Altered mental status. EXAM: PORTABLE CHEST 1 VIEW COMPARISON:  06/04/2016 FINDINGS: The cardiac silhouette, mediastinal and hilar contours are within normal limits. The lungs are clear of an acute process. No infiltrates, edema or effusions. No pulmonary lesions. The bony thorax is intact. IMPRESSION: No acute cardiopulmonary findings. Electronically Signed   By: Rudie Meyer M.D.   On: 12/05/2020 20:03   EEG adult  Result Date: 12/06/2020 Jefferson Fuel, MD     12/06/2020  5:40 PM  Routine EEG Report Admir Candelas is a 55 y.o. male with a history of altered mental status who is undergoing an EEG to evaluate for seizures. Report: This EEG was acquired with electrodes placed according to the International 10-20 electrode system (including Fp1, Fp2, F3, F4, C3, C4, P3, P4, O1, O2, T3, T4, T5, T6, A1, A2, Fz, Cz, Pz). The following electrodes were missing or displaced: none. There was no waking rhythm observed. The best background was 6-7 Hz with intermittent superimposed more pronounced diffuse slowing. This activity is reactive to stimulation. Sleep was manifested as K complexes and sleep spindles. There was no focal slowing. There were no interictal epileptiform discharges. There were no electrographic seizures identified. Photic stimulation and hyperventilation were not performed. Impression and clinical correlation: This EEG was obtained while sedated with ativan and asleep and is abnormal due to moderate diffuse slowing, indicative of global cerebral dysfunction. Bing Neighbors, MD [facility] [phone] If 7pm- 7am, please page neurology on call as listed in AMION.   CT Maxillofacial Wo Contrast  Result Date: 12/05/2020 CLINICAL DATA:  Delirium EXAM: CT HEAD WITHOUT CONTRAST CT MAXILLOFACIAL WITHOUT CONTRAST TECHNIQUE: Multidetector CT imaging of the head and maxillofacial structures were performed using the standard protocol without intravenous contrast. Multiplanar CT image reconstructions of the maxillofacial structures were also generated. COMPARISON:  None. FINDINGS: Motion degraded examination. Technologist reported patient combative during the exam. CT HEAD FINDINGS Brain: No evidence of acute infarction, hemorrhage, hydrocephalus, extra-axial collection or mass lesion/mass effect. Vascular: No hyperdense vessel or unexpected calcification. Skull: Normal. Negative for fracture or focal lesion. Other: None. CT MAXILLOFACIAL FINDINGS Osseous: No acute maxillofacial  bone fracture. Bony orbital walls are intact. Mandible intact. Temporomandibular joints are aligned without dislocation. Multifocal dental periapical lucencies. Orbits: Negative. No traumatic or inflammatory finding. Sinuses: Clear. Soft tissues: No soft tissue swelling or hematoma. IMPRESSION: 1. This limitation there is motion degraded examination. Within  no evidence of an acute intracranial process. 2. No acute maxillofacial bone fracture. 3. Multifocal dental periapical lucencies. Electronically Signed   By: Duanne Guess D.O.   On: 12/05/2020 18:11    Catarina Hartshorn, DO  [facility]  If 7PM-7AM, please contact night-coverage [email or web address] Password [email or web address] 12/11/2020, 1:04 PM   LOS: 5 days

## 2020-12-11 NOTE — Progress Notes (Signed)
Physical Therapy Treatment Patient Details Name: Jared Li MRN: [id number] DOB: [birth year] Today's Date: 12/11/2020   History of Present Illness Jared Li  is a 55 y.o. male, with history of adjustment disorder, cardiopulmonary syndrome, cervical radiculopathy, chronic intractable headache, GERD, lumbar postlaminectomy syndrome, tobacco use disorder, and more presents ED with a chief complaint of altered mental status.  Apparently patient brother found him and called 911.  Patient received intranasal Narcan, and then became very agitated.  Patient is on chronic large amounts of Dilaudid and oxycodone at home for chronic back pain.  Patient would not provide any history to the ED provider, but was moving all of his extremities due to pain.  07 (he was too agitated to lay still for it so he received a couple doses of Ativan.  The time I saw him patient is very somnolent most likely secondary to Ativan.  Patient would not provide me with any history either.    PT Comments    Patient demonstrates good return for bed<>chair transfers and bed mobility, but very unsteady on feet and had near fall when attempting to ambulate with forearm crutches.  Patient safer using RW demonstrating slow labored cadence with occasional buckling of knees and had to lean against wall for support when taking standing rest break before returning to room.  Patient requested to go back to bed after therapy due to fatigue.  Patient will benefit from continued physical therapy in hospital and recommended venue below to increase strength, balance, endurance for safe ADLs and gait.     Recommendations for follow up therapy are one component of a multi-disciplinary discharge planning process, led by the attending physician.  Recommendations may be updated based on patient status, additional functional criteria and insurance authorization.  Follow Up Recommendations  SNF;Supervision - Intermittent     Equipment  Recommendations  None recommended by PT    Recommendations for Other Services       Precautions / Restrictions Precautions Precautions: Fall Restrictions Weight Bearing Restrictions: No     Mobility  Bed Mobility Overal bed mobility: Needs Assistance Bed Mobility: Supine to Sit;Sit to Supine     Supine to sit: Supervision Sit to supine: Supervision   General bed mobility comments: slightly labored movement, increased time    Transfers Overall transfer level: Needs assistance Equipment used: Lofstrands;Rolling walker (2 wheeled) Transfers: Sit to/from [employer] Sit to Stand: Min guard Stand pivot transfers: Min guard;Min assist       General transfer comment: very unsteady with near fall using [employer] crutches, safer using RW  Ambulation/Gait Ambulation/Gait assistance: Min guard;Min assist Gait Distance (Feet): 35 Feet Assistive device: Rolling walker (2 wheeled) Gait Pattern/deviations: Decreased step length - right;Decreased step length - left;Decreased stride length Gait velocity: decreased   General Gait Details: slow labored cadence with occasional buckling of knees and had to lean against wall to take a rest break before returning to room   Stairs             Wheelchair Mobility    Modified Rankin (Stroke Patients Only)       Balance Overall balance assessment: Needs assistance Sitting-balance support: Feet supported;No upper extremity supported Sitting balance-Leahy Scale: Good Sitting balance - Comments: seated EOB   Standing balance support: During functional activity;No upper extremity supported Standing balance-Leahy Scale: Poor Standing balance comment: fair using RW, poor using [employer]ft[employer] crutches  Cognition Arousal/Alertness: Awake/alert Behavior During Therapy: WFL for tasks assessed/performed Overall Cognitive Status: Within Functional Limits for tasks assessed                                         Exercises      General Comments        Pertinent Vitals/Pain Pain Assessment: Faces Faces Pain Scale: Hurts a little bit Pain Location: low back Pain Descriptors / Indicators: Aching Pain Intervention(s): Limited activity within patient's tolerance;Monitored during session    Home Living                      Prior Function            PT Goals (current goals can now be found in the care plan section) Acute Rehab PT Goals Patient Stated Goal: return home with family to assist PT Goal Formulation: With patient Time For Goal Achievement: 12/17/20 Potential to Achieve Goals: Good Progress towards PT goals: Progressing toward goals    Frequency    Min 3X/week      PT Plan Discharge plan needs to be updated    Co-evaluation              AM-PAC PT "6 Clicks" Mobility   Outcome Measure  Help needed turning from your back to your side while in a flat bed without using bedrails?: None Help needed moving from lying on your back to sitting on the side of a flat bed without using bedrails?: A Little Help needed moving to and from a bed to a chair (including a wheelchair)?: A Little Help needed standing up from a chair using your arms (e.g., wheelchair or bedside chair)?: A Little Help needed to walk in hospital room?: A Lot Help needed climbing 3-5 steps with a railing? : A Lot 6 Click Score: 17    End of Session Equipment Utilized During Treatment: Gait belt Activity Tolerance: Patient tolerated treatment well;Patient limited by fatigue Patient left: in bed;with call bell/phone within reach Nurse Communication: Mobility status PT Visit Diagnosis: Unsteadiness on feet (R26.81);Other abnormalities of gait and mobility (R26.89);Muscle weakness (generalized) (M62.81)     Time: 5188-4166 PT Time Calculation (min) (ACUTE ONLY): 20 min  Charges:  $Gait Training: 8-22 mins $Therapeutic Activity: 8-22  mins                     11:07 AM, 12/11/20 Jared Li, MPT Physical Therapist with [facility] 336 [phone] office [phone] mobile phone

## 2020-12-11 NOTE — TOC Progression Note (Signed)
Transition of Care [facility]) - Progression Note    Patient Details  Name: Jared Li MRN: [id number] Date of Birth: 1965-06-13  Transition of Care [facility]) CM/SW Contact  Leitha Bleak, RN Phone Number: 12/11/2020, 1:22 PM  Clinical Narrative:   PT is now recommending SNF. Patient is agreeable. Ok to send out to surrounding area, he will discuss bed offers with his brother.  Expected Discharge Plan: Skilled Nursing Facility Barriers to Discharge: Continued Medical Work up  Expected Discharge Plan and Services Expected Discharge Plan: Skilled Nursing Facility    Post Acute Care Choice: Home Health Living arrangements for the past 2 months: Single Family Home                   Readmission Risk Interventions Readmission Risk Prevention Plan 12/08/2020  Transportation Screening Complete  HRI or Home Care Consult Complete  Social Work Consult for Recovery Care Planning/Counseling Complete  Palliative Care Screening Not Applicable  Medication Review [employer]) Complete  Some recent data might be hidden

## 2020-12-11 NOTE — [place] FL2 (Signed)
[place] MEDICAID FL2 LEVEL OF CARE SCREENING TOOL     IDENTIFICATION  Patient Name: Jared Li Birthdate: 04-05-1965 Sex: male Admission Date (Current Location): 12/05/2020  [facility] and [place] Number:  [employer] and Address:  [facility],  [address] [place] [address]      Provider Number: [id number]  Attending Physician Name and Address:  Catarina Hartshorn, MD  Relative Name and Phone Number:  Quintin Alto - brother [phone]    Current Level of Care: Hospital Recommended Level of Care: Skilled Nursing Facility Prior Approval Number:    Date Approved/Denied:   PASRR Number: [id number]  Discharge Plan: SNF    Current Diagnoses: Patient Active Problem List   Diagnosis Date Noted   Encephalopathy acute    Sepsis (HCC) 12/06/2020   Acute pyelonephritis 12/06/2020   Hypokalemia 12/06/2020   Opiate dependence (HCC) 12/06/2020   Nondisplaced fracture of fifth right metatarsal bone with routine healing 01/27/2020   Hx of adrenal insufficiency 01/18/2018   Hypogonadism male 01/18/2018   Radiculopathy 06/13/2016   History of neck surgery 05/21/2016   Skin candidiasis 07/21/2015   Infection due to Enterobacter cloacae 07/10/2015   Infection and inflammatory reaction due to other nervous system device, implant or graft, initial encounter (HCC) 06/07/2015   Open wound of lumbar region with complication 04/20/2015   Nervous system device, implant, or graft infection or inflammation (HCC) 04/11/2015   Cellulitis 03/01/2015   Spinal cord compression (HCC) 01/20/2015    Orientation RESPIRATION BLADDER Height & Weight     Self, Time, Situation, Place  Normal Continent Weight: 76.4 kg Height:  5\' 10"  (177.8 cm)  BEHAVIORAL SYMPTOMS/MOOD NEUROLOGICAL BOWEL NUTRITION STATUS      Continent Diet (See DC summary)  AMBULATORY STATUS COMMUNICATION OF NEEDS Skin   Extensive Assist Verbally Normal                       Personal Care  Assistance Level of Assistance  Bathing, Feeding, Dressing Bathing Assistance: Limited assistance Feeding assistance: Independent Dressing Assistance: Limited assistance     Functional Limitations Info  Sight, Hearing, Speech Sight Info: Adequate Hearing Info: Adequate Speech Info: Adequate    SPECIAL CARE FACTORS FREQUENCY  PT (By licensed PT)     PT Frequency: 5 Times a week              Contractures Contractures Info: Not present    Additional Factors Info  Code Status, Allergies Code Status Info: Full Allergies Info: adhesive, codeine, morphine           Current Medications (12/11/2020):  This is the current hospital active medication list Current Facility-Administered Medications  Medication Dose Route Frequency Provider Last Rate Last Admin   0.9 % NaCl with KCl 20 mEq/ L  infusion   Intravenous Continuous Tat, David, MD 125 mL/hr at 12/10/20 2310 New Bag at 12/10/20 2310   acetaminophen (TYLENOL) tablet 650 mg  650 mg Oral Q6H PRN Zierle-Ghosh, Asia B, DO   650 mg at 12/11/20 0419   Or   acetaminophen (TYLENOL) suppository 650 mg  650 mg Rectal Q6H PRN Zierle-Ghosh, Asia B, DO       amoxicillin-clavulanate (AUGMENTIN) 875-125 MG per tablet 1 tablet  1 tablet Oral Q12H Amin, Ankit Chirag, MD   1 tablet at 12/11/20 0849   ascorbic acid (VITAMIN C) tablet 500 mg  500 mg Oral BID Zierle-Ghosh, Asia B, DO   500 mg at 12/11/20 [phone]  cloNIDine (CATAPRES) tablet 0.2 mg  0.2 mg Oral BID Amin, Ankit Chirag, MD   0.2 mg at 12/11/20 0849   dexmedetomidine (PRECEDEX) 400 MCG/100ML (4 mcg/mL) infusion  0.4-1.2 mcg/kg/hr Intravenous Titrated Zierle-Ghosh, Asia B, DO   Stopped at 12/06/20 1900   diphenhydrAMINE (BENADRYL) capsule 25 mg  25 mg Oral Q6H PRN Zierle-Ghosh, Asia B, DO   25 mg at 12/10/20 0142   dronabinol (MARINOL) capsule 5 mg  5 mg Oral BID AC Zierle-Ghosh, Asia B, DO   5 mg at 12/10/20 1717   feeding supplement (ENSURE ENLIVE / ENSURE PLUS) liquid   Oral QID  Zierle-Ghosh, Asia B, DO   Given at 12/11/20 0850   folic acid (FOLVITE) tablet 1 mg  1 mg Oral Daily Zierle-Ghosh, Asia B, DO   1 mg at 12/11/20 0849   haloperidol (HALDOL) tablet 2 mg  2 mg Oral Q6H PRN Dimple Nanas, MD   2 mg at 12/09/20 0253   Or   haloperidol lactate (HALDOL) injection 2 mg  2 mg Intramuscular Q6H PRN Amin, Ankit Chirag, MD   2 mg at 12/06/20 2257   heparin injection 5,000 Units  5,000 Units Subcutaneous Q8H Zierle-Ghosh, Asia B, DO   5,000 Units at 12/11/20 9201   hydrALAZINE (APRESOLINE) injection 10 mg  10 mg Intravenous Q4H PRN Amin, Ankit Chirag, MD       imipramine (TOFRANIL) tablet 10 mg  10 mg Oral QHS Zierle-Ghosh, Asia B, DO   10 mg at 12/10/20 2145   ipratropium-albuterol (DUONEB) 0.5-2.5 (3) MG/3ML nebulizer solution 3 mL  3 mL Nebulization Q4H PRN Amin, Ankit Chirag, MD   3 mL at 12/08/20 1119   LORazepam (ATIVAN) tablet 0.5 mg  0.5 mg Oral Q8H PRN Tat, Onalee Hua, MD   0.5 mg at 12/09/20 2348   metoprolol tartrate (LOPRESSOR) injection 5 mg  5 mg Intravenous Q4H PRN Amin, Ankit Chirag, MD       morphine 4 MG/ML injection 4 mg  4 mg Intravenous Q4H PRN Zierle-Ghosh, Asia B, DO   4 mg at 12/06/20 2304   multivitamin with minerals tablet 1 tablet  1 tablet Oral Daily Zierle-Ghosh, Asia B, DO   1 tablet at 12/11/20 0848   ondansetron (ZOFRAN) tablet 4 mg  4 mg Oral Q6H PRN Zierle-Ghosh, Asia B, DO   4 mg at 12/08/20 2216   Or   ondansetron (ZOFRAN) injection 4 mg  4 mg Intravenous Q6H PRN Zierle-Ghosh, Asia B, DO   4 mg at 12/11/20 0915   oxyCODONE (Oxy IR/ROXICODONE) immediate release tablet 15 mg  15 mg Oral Q4H PRN Zierle-Ghosh, Asia B, DO   15 mg at 12/11/20 0848   pantoprazole (PROTONIX) EC tablet 40 mg  40 mg Oral Daily Amin, Ankit Chirag, MD   40 mg at 12/11/20 0849   senna-docusate (Senokot-S) tablet 1 tablet  1 tablet Oral QHS PRN Amin, Ankit Chirag, MD       sertraline (ZOLOFT) tablet 100 mg  100 mg Oral QHS Amin, Ankit Chirag, MD   100 mg at 12/10/20  2143   simethicone (MYLICON) chewable tablet 80 mg  80 mg Oral Q6H PRN Amin, Ankit Chirag, MD       thiamine tablet 100 mg  100 mg Oral Daily Zierle-Ghosh, Asia B, DO   100 mg at 12/11/20 0849   Or   thiamine (B-1) injection 100 mg  100 mg Intravenous Daily Zierle-Ghosh, Asia B, DO   100 mg at 12/06/20 1020  traZODone (DESYREL) tablet 50 mg  50 mg Oral QHS PRN Amin, Ankit Chirag, MD   50 mg at 12/10/20 2158   zinc sulfate capsule 220 mg  220 mg Oral Daily Zierle-Ghosh, Asia B, DO   220 mg at 12/11/20 7517     Discharge Medications: Please see discharge summary for a list of discharge medications.  Relevant Imaging Results:  Relevant Lab Results:   Additional Information SS# [id number]  Leitha Bleak, RN

## 2020-12-12 MED ORDER — CLONIDINE HCL 0.2 MG PO TABS: 0.2000 mg | ORAL_TABLET | Freq: Two times a day (BID) | ORAL | 1 refills | Status: DC

## 2020-12-12 NOTE — Plan of Care (Signed)
  Problem: Safety: Goal: Non-violent Restraint(s) Outcome: Adequate for Discharge   Problem: Education: Goal: Knowledge of General Education information will improve Description: Including pain rating scale, medication(s)/side effects and non-pharmacologic comfort measures Outcome: Adequate for Discharge   Problem: Health Behavior/Discharge Planning: Goal: Ability to manage health-related needs will improve Outcome: Adequate for Discharge   Problem: Clinical Measurements: Goal: Ability to maintain clinical measurements within normal limits will improve Outcome: Adequate for Discharge Goal: Will remain free from infection Outcome: Adequate for Discharge Goal: Diagnostic test results will improve Outcome: Adequate for Discharge Goal: Respiratory complications will improve Outcome: Adequate for Discharge Goal: Cardiovascular complication will be avoided Outcome: Adequate for Discharge   Problem: Activity: Goal: Risk for activity intolerance will decrease Outcome: Adequate for Discharge   Problem: Nutrition: Goal: Adequate nutrition will be maintained Outcome: Adequate for Discharge   Problem: Elimination: Goal: Will not experience complications related to bowel motility Outcome: Adequate for Discharge Goal: Will not experience complications related to urinary retention Outcome: Adequate for Discharge   Problem: Coping: Goal: Level of anxiety will decrease Outcome: Adequate for Discharge   Problem: Pain Managment: Goal: General experience of comfort will improve Outcome: Adequate for Discharge   Problem: Safety: Goal: Ability to remain free from injury will improve Outcome: Adequate for Discharge   Problem: Skin Integrity: Goal: Risk for impaired skin integrity will decrease Outcome: Adequate for Discharge

## 2020-12-12 NOTE — Progress Notes (Signed)
Midline removed from L. Upper arm with no complications. Patient stated he has allergic reaction to adhesives and requested to hold pressure himself instead of placing a dressing on.

## 2020-12-12 NOTE — Discharge Summary (Signed)
Physician Discharge Summary  Jared Li ZDG:[id number] DOB: [birth year] DOA: 12/05/2020  PCP: Benita Stabile, MD  Admit date: 12/05/2020 Discharge date: 12/12/2020  Admitted From: Home Disposition:  Home   Recommendations for Outpatient Follow-up:  Follow up with PCP in 1-2 weeks Please obtain BMP/CBC in one week   Home Health: HHPT and HHOT   Discharge Condition: Stable CODE STATUS: FULL Diet recommendation: Heart Healthy    Brief/Interim Summary: 55 year old with history of adjustment disorder, cardiopulmonary syndrome, cervical radiculopathy, chronic intractable pain, GERD, cauda equina syndrome lumbar postlaminectomy, tobacco use presents to the ED with altered mental status.  Initially patient was very agitated, typically uses large amounts of Dilaudid and oxycodone for chronic back pain.  Upon admission she was febrile, tachycardic and hypertensive.  COVID-19 negative.  Chest x-ray unremarkable, UDS positive for marijuana and opioids.  CT head did not show any acute pathology but showed multifocal dental periapical lucencies.  CT abdomen pelvis was questionable for bilateral pyelonephritis.  Patient was admitted for agitation, sepsis.  Initially patient started on Precedex drip which was weaned off over 24 hours as his mentation was improving.  His sepsis physiology was improving and he was tolerating orals therefore antibiotics were changed to oral Augmentin.  Discharge Diagnoses:  Acute metabolic encephalopathy, improved - This is improved.  Combination of withdrawal and underlying infection. -CT head negative, shows periapical dental lucencies -EEG shows generalized slowing but no seizure activity - He has been weaned off Precedex drip. -9/25---mental status at baseline per family at bedside   Sepsis secondary to acute pyelonephritis with concerns of aspiration pneumonia - Cultures are negative.  Procalcitonin 0.89.  On oral Augmentin, last day 9/26. -pt finished 7  days of abx during hospitalzation -stable on RA -afebrile and hemodynamically stable   Rhabdomyolysis - [id number]>>[id number] -continue IVF   Hypokalemia - Repleted   Chronic pain, opiate dependence - At home he is on hydromorphone 16 mg twice daily, Roxicodone 15 mg every 4 hours as needed. - Now he is off Precedex - his off his hydromorphone and pain is controlled - would not restart hydromorphone after discharge   GERD - PPI at home.   Adjustment disorder - On Zoloft at home   Tobacco use - As needed bronchodilators   Essential hypertension, uncontrolled - Increase clonidine to 0.2 mg twice daily.  IV hydralazine as needed   Social -family requests repeat PT eval>>SNF -they have concern regarding patient's ability to care for himself as pt lives alone in the setting of his deconditioned state -However after repeat OT evaluation and continued conversations, patient stated he wants to go home with HHPT.  He did not want to go to SNF  Discharge Instructions   Allergies as of 12/12/2020       Reactions   Adhesive [tape] Other (See Comments)   Skin blisters   Codeine Nausea And Vomiting   Morphine And Related    Itching        Medication List     STOP taking these medications    HYDROmorphone HCl 16 MG T24a   ibuprofen 800 MG tablet Commonly known as: ADVIL   metoprolol tartrate 25 MG tablet Commonly known as: LOPRESSOR       TAKE these medications    albuterol 108 (90 Base) MCG/ACT inhaler Commonly known as: VENTOLIN HFA Inhale 2 puffs into the lungs every 6 (six) hours as needed for wheezing or shortness of breath.   cloNIDine 0.2 MG tablet Commonly known as: CATAPRES Take  1 tablet (0.2 mg total) by mouth 2 (two) times daily. What changed:  medication strength how much to take   diphenhydrAMINE 25 MG tablet Commonly known as: BENADRYL Take 25 mg by mouth every 6 (six) hours as needed for itching.   dronabinol 5 MG capsule Commonly  known as: MARINOL Take one capsule by mouth twice daily before meals   ENSURE HIGH PROTEIN PO Take 1 Can by mouth 4 (four) times daily.   GAS-X PO Take 2 tablets by mouth 2 (two) times daily as needed (gas).   Gralise 600 MG Tabs Generic drug: Gabapentin (Once-Daily) Take 3,000 mg by mouth daily with supper. Takes 5 tablets at dinner   imipramine 10 MG tablet Commonly known as: TOFRANIL Take 10 mg by mouth at bedtime.   LORazepam 0.5 MG tablet Commonly known as: ATIVAN Take 0.5 mg by mouth 2 (two) times daily as needed.   multivitamin with minerals Tabs tablet Take 1 tablet by mouth daily.   omeprazole 20 MG tablet Commonly known as: PRILOSEC OTC Take 20 mg by mouth daily as needed.   ondansetron 4 MG tablet Commonly known as: ZOFRAN Take 4 mg by mouth every 8 (eight) hours as needed for nausea or vomiting.   oxyCODONE 15 MG immediate release tablet Commonly known as: ROXICODONE Take 15 mg by mouth every 4 (four) hours as needed for pain.   rizatriptan 10 MG tablet Commonly known as: MAXALT Take 10 mg by mouth every 2 (two) hours as needed for migraine. May repeat in 2 hours if needed   sertraline 100 MG tablet Commonly known as: ZOLOFT Take 100 mg by mouth at bedtime. Takes 2 tablets   vitamin C 500 MG tablet Commonly known as: ASCORBIC ACID Take 500 mg by mouth 2 (two) times daily.   zinc sulfate 220 (50 Zn) MG capsule Take 220 mg by mouth daily.        Follow-up Information     [facility]   Specialty: Home Health Services Why: PT will call to set up your first home visit.               Allergies  Allergen Reactions   Adhesive [Tape] Other (See Comments)    Skin blisters   Codeine Nausea And Vomiting   Morphine And Related     Itching     Consultations: none   Procedures/Studies: CT HEAD WO CONTRAST ( )  Result Date: 12/05/2020 CLINICAL DATA:  Delirium EXAM: CT HEAD WITHOUT CONTRAST CT  MAXILLOFACIAL WITHOUT CONTRAST TECHNIQUE: Multidetector CT imaging of the head and maxillofacial structures were performed using the standard protocol without intravenous contrast. Multiplanar CT image reconstructions of the maxillofacial structures were also generated. COMPARISON:  None. FINDINGS: Motion degraded examination. Technologist reported patient combative during the exam. CT HEAD FINDINGS Brain: No evidence of acute infarction, hemorrhage, hydrocephalus, extra-axial collection or mass lesion/mass effect. Vascular: No hyperdense vessel or unexpected calcification. Skull: Normal. Negative for fracture or focal lesion. Other: None. CT MAXILLOFACIAL FINDINGS Osseous: No acute maxillofacial bone fracture. Bony orbital walls are intact. Mandible intact. Temporomandibular joints are aligned without dislocation. Multifocal dental periapical lucencies. Orbits: Negative. No traumatic or inflammatory finding. Sinuses: Clear. Soft tissues: No soft tissue swelling or hematoma. IMPRESSION: 1. This limitation there is motion degraded examination. Within no evidence of an acute intracranial process. 2. No acute maxillofacial bone fracture. 3. Multifocal dental periapical lucencies. Electronically Signed   By: Duanne Guess D.O.   On: 12/05/2020 18:11  CT ABDOMEN PELVIS W CONTRAST  Result Date: 12/05/2020 CLINICAL DATA:  Abdominal pain with fever. EXAM: CT ABDOMEN AND PELVIS WITH CONTRAST TECHNIQUE: Multidetector CT imaging of the abdomen and pelvis was performed using the standard protocol following bolus administration of intravenous contrast. CONTRAST:  80mL OMNIPAQUE IOHEXOL 350 MG/ML SOLN COMPARISON:  None. FINDINGS: Lower chest: No acute abnormality. Hepatobiliary: No focal liver abnormality is seen. No gallstones, gallbladder wall thickening, or biliary dilatation. Pancreas: Unremarkable. No pancreatic ductal dilatation or surrounding inflammatory changes. Spleen: Normal in size without focal abnormality.  Adrenals/Urinary Tract: There is limited evaluation secondary to motion artifact. There are questionable areas of subtle patchy hypodensity throughout the bilateral renal parenchyma, although this may be related to streak artifact. Kidneys are normal in size and contour. There is no hydronephrosis or perinephric fluid. The bladder is within normal limits. Stomach/Bowel: Stomach is within normal limits. Appendix appears normal. No evidence of bowel wall thickening, distention, or inflammatory changes. Vascular/Lymphatic: Aortic atherosclerosis. No enlarged abdominal or pelvic lymph nodes. Reproductive: Not well evaluated secondary to motion artifact. Other: No abdominal wall hernia or abnormality. No abdominopelvic ascites. Musculoskeletal: L4-S1 posterior fusion hardware is present and appears uncomplicated. No acute fractures are seen. IMPRESSION: 1. Questionable patchy areas of hypodensity throughout the bilateral renal parenchyma which would be concerning for pyelonephritis. Note is made that there is artifact in this region. Please correlate clinically. No hydronephrosis or perinephric fluid collection. 2.  Aortic Atherosclerosis (ICD10-I70.0). Electronically Signed   By: Darliss Cheney M.D.   On: 12/05/2020 23:40   DG Chest Portable 1 View  Result Date: 12/05/2020 CLINICAL DATA:  Altered mental status. EXAM: PORTABLE CHEST 1 VIEW COMPARISON:  06/04/2016 FINDINGS: The cardiac silhouette, mediastinal and hilar contours are within normal limits. The lungs are clear of an acute process. No infiltrates, edema or effusions. No pulmonary lesions. The bony thorax is intact. IMPRESSION: No acute cardiopulmonary findings. Electronically Signed   By: Rudie Meyer M.D.   On: 12/05/2020 20:03   EEG adult  Result Date: 12/06/2020 Jefferson Fuel, MD     12/06/2020  5:40 PM Routine EEG Report Randel Hargens is a 55 y.o. male with a history of altered mental status who is undergoing an EEG to evaluate for seizures.  Report: This EEG was acquired with electrodes placed according to the International 10-20 electrode system (including Fp1, Fp2, F3, F4, C3, C4, P3, P4, O1, O2, T3, T4, T5, T6, A1, A2, Fz, Cz, Pz). The following electrodes were missing or displaced: none. There was no waking rhythm observed. The best background was 6-7 Hz with intermittent superimposed more pronounced diffuse slowing. This activity is reactive to stimulation. Sleep was manifested as K complexes and sleep spindles. There was no focal slowing. There were no interictal epileptiform discharges. There were no electrographic seizures identified. Photic stimulation and hyperventilation were not performed. Impression and clinical correlation: This EEG was obtained while sedated with ativan and asleep and is abnormal due to moderate diffuse slowing, indicative of global cerebral dysfunction. Bing Neighbors, MD [facility] [phone] If 7pm- 7am, please page neurology on call as listed in AMION.   CT Maxillofacial Wo Contrast  Result Date: 12/05/2020 CLINICAL DATA:  Delirium EXAM: CT HEAD WITHOUT CONTRAST CT MAXILLOFACIAL WITHOUT CONTRAST TECHNIQUE: Multidetector CT imaging of the head and maxillofacial structures were performed using the standard protocol without intravenous contrast. Multiplanar CT image reconstructions of the maxillofacial structures were also generated. COMPARISON:  None. FINDINGS: Motion degraded examination. Technologist reported patient combative during the exam.  CT HEAD FINDINGS Brain: No evidence of acute infarction, hemorrhage, hydrocephalus, extra-axial collection or mass lesion/mass effect. Vascular: No hyperdense vessel or unexpected calcification. Skull: Normal. Negative for fracture or focal lesion. Other: None. CT MAXILLOFACIAL FINDINGS Osseous: No acute maxillofacial bone fracture. Bony orbital walls are intact. Mandible intact. Temporomandibular joints are aligned without dislocation. Multifocal dental  periapical lucencies. Orbits: Negative. No traumatic or inflammatory finding. Sinuses: Clear. Soft tissues: No soft tissue swelling or hematoma. IMPRESSION: 1. This limitation there is motion degraded examination. Within no evidence of an acute intracranial process. 2. No acute maxillofacial bone fracture. 3. Multifocal dental periapical lucencies. Electronically Signed   By: Duanne Guess D.O.   On: 12/05/2020 18:11        Discharge Exam: Vitals:   12/12/20 0634 12/12/20 1313  BP: (!) 148/100 (!) 149/96  Pulse: 67 75  Resp:  18  Temp:  98 F (36.7 C)  SpO2:  100%   Vitals:   12/11/20 2056 12/12/20 0604 12/12/20 0634 12/12/20 1313  BP:  (!) 154/105 (!) 148/100 (!) 149/96  Pulse: 69 71 67 75  Resp: 20 20  18   Temp: 99.6 F (37.6 C) 98 F (36.7 C)  98 F (36.7 C)  TempSrc: Oral Oral  Oral  SpO2: 97% 97%  100%  Weight:      Height:        General: Pt is alert, awake, not in acute distress Cardiovascular: RRR, S1/S2 +, no rubs, no gallops Respiratory: diminished BS.  Bibasilar crackles. No wheeze Abdominal: Soft, NT, ND, bowel sounds + Extremities: no edema, no cyanosis   The results of significant diagnostics from this hospitalization (including imaging, microbiology, ancillary and laboratory) are listed below for reference.    Significant Diagnostic Studies: CT HEAD WO CONTRAST ( )  Result Date: 12/05/2020 CLINICAL DATA:  Delirium EXAM: CT HEAD WITHOUT CONTRAST CT MAXILLOFACIAL WITHOUT CONTRAST TECHNIQUE: Multidetector CT imaging of the head and maxillofacial structures were performed using the standard protocol without intravenous contrast. Multiplanar CT image reconstructions of the maxillofacial structures were also generated. COMPARISON:  None. FINDINGS: Motion degraded examination. Technologist reported patient combative during the exam. CT HEAD FINDINGS Brain: No evidence of acute infarction, hemorrhage, hydrocephalus, extra-axial collection or mass lesion/mass  effect. Vascular: No hyperdense vessel or unexpected calcification. Skull: Normal. Negative for fracture or focal lesion. Other: None. CT MAXILLOFACIAL FINDINGS Osseous: No acute maxillofacial bone fracture. Bony orbital walls are intact. Mandible intact. Temporomandibular joints are aligned without dislocation. Multifocal dental periapical lucencies. Orbits: Negative. No traumatic or inflammatory finding. Sinuses: Clear. Soft tissues: No soft tissue swelling or hematoma. IMPRESSION: 1. This limitation there is motion degraded examination. Within no evidence of an acute intracranial process. 2. No acute maxillofacial bone fracture. 3. Multifocal dental periapical lucencies. Electronically Signed   By: 12/07/2020 D.O.   On: 12/05/2020 18:11   CT ABDOMEN PELVIS W CONTRAST  Result Date: 12/05/2020 CLINICAL DATA:  Abdominal pain with fever. EXAM: CT ABDOMEN AND PELVIS WITH CONTRAST TECHNIQUE: Multidetector CT imaging of the abdomen and pelvis was performed using the standard protocol following bolus administration of intravenous contrast. CONTRAST:  90mL OMNIPAQUE IOHEXOL 350 MG/ML SOLN COMPARISON:  None. FINDINGS: Lower chest: No acute abnormality. Hepatobiliary: No focal liver abnormality is seen. No gallstones, gallbladder wall thickening, or biliary dilatation. Pancreas: Unremarkable. No pancreatic ductal dilatation or surrounding inflammatory changes. Spleen: Normal in size without focal abnormality. Adrenals/Urinary Tract: There is limited evaluation secondary to motion artifact. There are questionable areas of subtle patchy hypodensity  throughout the bilateral renal parenchyma, although this may be related to streak artifact. Kidneys are normal in size and contour. There is no hydronephrosis or perinephric fluid. The bladder is within normal limits. Stomach/Bowel: Stomach is within normal limits. Appendix appears normal. No evidence of bowel wall thickening, distention, or inflammatory changes.  Vascular/Lymphatic: Aortic atherosclerosis. No enlarged abdominal or pelvic lymph nodes. Reproductive: Not well evaluated secondary to motion artifact. Other: No abdominal wall hernia or abnormality. No abdominopelvic ascites. Musculoskeletal: L4-S1 posterior fusion hardware is present and appears uncomplicated. No acute fractures are seen. IMPRESSION: 1. Questionable patchy areas of hypodensity throughout the bilateral renal parenchyma which would be concerning for pyelonephritis. Note is made that there is artifact in this region. Please correlate clinically. No hydronephrosis or perinephric fluid collection. 2.  Aortic Atherosclerosis (ICD10-I70.0). Electronically Signed   By: Darliss Cheney M.D.   On: 12/05/2020 23:40   DG Chest Portable 1 View  Result Date: 12/05/2020 CLINICAL DATA:  Altered mental status. EXAM: PORTABLE CHEST 1 VIEW COMPARISON:  06/04/2016 FINDINGS: The cardiac silhouette, mediastinal and hilar contours are within normal limits. The lungs are clear of an acute process. No infiltrates, edema or effusions. No pulmonary lesions. The bony thorax is intact. IMPRESSION: No acute cardiopulmonary findings. Electronically Signed   By: Rudie Meyer M.D.   On: 12/05/2020 20:03   EEG adult  Result Date: 12/06/2020 Jefferson Fuel, MD     12/06/2020  5:40 PM Routine EEG Report Nirvaan Frett is a 55 y.o. male with a history of altered mental status who is undergoing an EEG to evaluate for seizures. Report: This EEG was acquired with electrodes placed according to the International 10-20 electrode system (including Fp1, Fp2, F3, F4, C3, C4, P3, P4, O1, O2, T3, T4, T5, T6, A1, A2, Fz, Cz, Pz). The following electrodes were missing or displaced: none. There was no waking rhythm observed. The best background was 6-7 Hz with intermittent superimposed more pronounced diffuse slowing. This activity is reactive to stimulation. Sleep was manifested as K complexes and sleep spindles. There was no focal  slowing. There were no interictal epileptiform discharges. There were no electrographic seizures identified. Photic stimulation and hyperventilation were not performed. Impression and clinical correlation: This EEG was obtained while sedated with ativan and asleep and is abnormal due to moderate diffuse slowing, indicative of global cerebral dysfunction. Bing Neighbors, MD [facility] [phone] If 7pm- 7am, please page neurology on call as listed in AMION.   CT Maxillofacial Wo Contrast  Result Date: 12/05/2020 CLINICAL DATA:  Delirium EXAM: CT HEAD WITHOUT CONTRAST CT MAXILLOFACIAL WITHOUT CONTRAST TECHNIQUE: Multidetector CT imaging of the head and maxillofacial structures were performed using the standard protocol without intravenous contrast. Multiplanar CT image reconstructions of the maxillofacial structures were also generated. COMPARISON:  None. FINDINGS: Motion degraded examination. Technologist reported patient combative during the exam. CT HEAD FINDINGS Brain: No evidence of acute infarction, hemorrhage, hydrocephalus, extra-axial collection or mass lesion/mass effect. Vascular: No hyperdense vessel or unexpected calcification. Skull: Normal. Negative for fracture or focal lesion. Other: None. CT MAXILLOFACIAL FINDINGS Osseous: No acute maxillofacial bone fracture. Bony orbital walls are intact. Mandible intact. Temporomandibular joints are aligned without dislocation. Multifocal dental periapical lucencies. Orbits: Negative. No traumatic or inflammatory finding. Sinuses: Clear. Soft tissues: No soft tissue swelling or hematoma. IMPRESSION: 1. This limitation there is motion degraded examination. Within no evidence of an acute intracranial process. 2. No acute maxillofacial bone fracture. 3. Multifocal dental periapical lucencies. Electronically Signed   By: Janyth Pupa  Plundo D.O.   On: 12/05/2020 18:11    Microbiology: Recent Results (from the past 240 hour(s))  Urine Culture      Status: None   Collection Time: 12/05/20  7:23 PM   Specimen: In/Out Cath Urine  Result Value Ref Range Status   Specimen Description   Final    IN/OUT CATH URINE Performed at [facility], [address] [place], [place] [address]    Special Requests   Final    NONE Performed at [facility], [address] [place], [place] [address]    Culture   Final    NO GROWTH Performed at [facility], [address] [place], [place] [address]    Report Status 12/07/2020 FINAL  Final  Culture, blood (routine x 2)     Status: None   Collection Time: 12/05/20  8:06 PM   Specimen: BLOOD RIGHT HAND  Result Value Ref Range Status   Specimen Description BLOOD RIGHT HAND  Final   Special Requests   Final    Blood Culture adequate volume BOTTLES DRAWN AEROBIC AND ANAEROBIC   Culture   Final    NO GROWTH 5 DAYS Performed at [facility], [address], [place], [place] [address]    Report Status 12/10/2020 FINAL  Final  Culture, blood (routine x 2)     Status: None   Collection Time: 12/05/20  8:06 PM   Specimen: BLOOD LEFT FOREARM  Result Value Ref Range Status   Specimen Description BLOOD LEFT FOREARM  Final   Special Requests   Final    Blood Culture results may not be optimal due to an excessive volume of blood received in culture bottles BOTTLES DRAWN AEROBIC AND ANAEROBIC   Culture   Final    NO GROWTH 5 DAYS Performed at [facility], [address], [place], [place] [address]    Report Status 12/10/2020 FINAL  Final  Resp Panel by RT-PCR (Flu A&B, Covid) Nasopharyngeal Swab     Status: None   Collection Time: 12/05/20  8:13 PM   Specimen: Nasopharyngeal Swab; Nasopharyngeal(NP) swabs in vial transport medium  Result Value Ref Range Status   SARS Coronavirus 2 by RT PCR NEGATIVE NEGATIVE Final    Comment: (NOTE) SARS-CoV-2 target nucleic acids are NOT DETECTED.  The SARS-CoV-2 RNA is generally detectable in upper respiratory specimens during the acute phase of infection.  The lowest concentration of SARS-CoV-2 viral copies this assay can detect is 138 copies/mL. A negative result does not preclude SARS-Cov-2 infection and should not be used as the sole basis for treatment or other patient management decisions. A negative result may occur with  improper specimen collection/handling, submission of specimen other than nasopharyngeal swab, presence of viral mutation(s) within the areas targeted by this assay, and inadequate number of viral copies(<138 copies/mL). A negative result must be combined with clinical observations, patient history, and epidemiological information. The expected result is Negative.  Fact Sheet for Patients:  BloggerCourse.com  Fact Sheet for Healthcare Providers:  SeriousBroker.it  This test is no t yet approved or cleared by the [place] FDA and  has been authorized for detection and/or diagnosis of SARS-CoV-2 by FDA under an Emergency Use Authorization (EUA). This EUA will remain  in effect (meaning this test can be used) for the duration of the COVID-19 declaration under Section 564(b)(1) of the Act, 21 U.S.C.section 360bbb-3(b)(1), unless the authorization is terminated  or revoked sooner.       Influenza A by PCR  NEGATIVE NEGATIVE Final   Influenza B by PCR NEGATIVE NEGATIVE Final    Comment: (NOTE) The Xpert Xpress SARS-CoV-2/FLU/RSV plus assay is intended as an aid in the diagnosis of influenza from Nasopharyngeal swab specimens and should not be used as a sole basis for treatment. Nasal washings and aspirates are unacceptable for Xpert Xpress SARS-CoV-2/FLU/RSV testing.  Fact Sheet for Patients: BloggerCourse.com  Fact Sheet for Healthcare Providers: SeriousBroker.it  This test is not yet approved or cleared by the [place] FDA and has been authorized for detection and/or diagnosis of SARS-CoV-2 by FDA  under an Emergency Use Authorization (EUA). This EUA will remain in effect (meaning this test can be used) for the duration of the COVID-19 declaration under Section 564(b)(1) of the Act, 21 U.S.C. section 360bbb-3(b)(1), unless the authorization is terminated or revoked.  Performed at [facility], [address] [place], [place] [address]   MRSA Next Gen by PCR, Nasal     Status: None   Collection Time: 12/06/20  2:46 AM   Specimen: Nasal Mucosa; Nasal Swab  Result Value Ref Range Status   MRSA by PCR Next Gen NOT DETECTED NOT DETECTED Final    Comment: (NOTE) The GeneXpert MRSA Assay (FDA approved for NASAL specimens only), is one component of a comprehensive MRSA colonization surveillance program. It is not intended to diagnose MRSA infection nor to guide or monitor treatment for MRSA infections. Test performance is not FDA approved in patients less than 41 years old. Performed at [facility], [address], [place], [place] [address]      Labs: Basic Metabolic Panel: Recent Labs  Lab 12/07/20 [phone] 12/08/20 0531 12/09/20 0551 12/10/20 0528 12/11/20 0532  NA 135 138 141 138 138  K 3.4* 2.9* 4.1 3.5 3.5  CL 105 104 108 104 105  CO2 GLUCOSE 91 103* 87 101* 88  BUN 10 5* <5* 5* 5*  CREATININE 0.91 0.89 0.81 0.85 0.89  CALCIUM 7.9* 8.4* 8.7* 8.9 8.6*  MG 1.9 1.8 1.9 1.9 1.9  PHOS  --  2.5  --   --   --    Liver Function Tests: Recent Labs  Lab 12/05/20 1748 12/06/20 0422  AST 32 34  ALT 17 17  ALKPHOS 178* 140*  BILITOT 0.8 0.8  PROT 9.1* 7.5  ALBUMIN 4.9 3.9   No results for input(s): LIPASE, AMYLASE in the last 168 hours. No results for input(s): AMMONIA in the last 168 hours. CBC: Recent Labs  Lab 12/05/20 1748 12/06/20 0422 12/07/20 0442 12/08/20 0531 12/09/20 0551  WBC 25.5* 19.7* 11.3* 8.2 7.5  NEUTROABS 20.8* 15.1*  --   --   --   HGB 19.1* 17.3* 14.0 13.8 14.5  HCT 55.0* 50.6 42.7 42.1 44.1  MCV 96.3 97.3 99.5 98.8 101.1*   PLT 256 198 149* 155 176   Cardiac Enzymes: Recent Labs  Lab 12/07/20 0442 12/08/20 0531 12/09/20 0551 12/10/20 0528 12/11/20 0532  CKTOTAL 1,534* 4,888* 3,529*  3,523* 1,671* 329   BNP: Invalid input(s): POCBNP CBG: No results for input(s): GLUCAP in the last 168 hours.  Time coordinating discharge:  36 minutes  Signed:  Catarina Hartshorn, DO [facility] Pager: [phone] 12/12/2020, 1:41 PM

## 2020-12-12 NOTE — Care Management Important Message (Signed)
Important Message  Patient Details  Name: Jared Li MRN: [id number] Date of Birth: 1965-07-24   Medicare Important Message Given:  Yes     Corey Harold 12/12/2020, 1:18 PM

## 2020-12-12 NOTE — TOC Transition Note (Signed)
Transition of Care [facility]) - CM/SW Discharge Note   Patient Details  Name: Jared Li MRN: [id number] Date of Birth: [birth year]  [facility] of [facility]) CM/SW Contact:  Leitha Bleak, RN Phone Number: 12/12/2020, 1:36 PM   Clinical Narrative:   OT states patient is much better today. Recommending Home health. Patient is wanting to go home. Navi has not approved SNF and wanting more notes. Today therapies will show home health.  TOC explain to his brother. He is agreeable to home health. Linda with [facility] updated, orders placed. MD/ RN updated.   Final next level of care: Home w Home Health Services Barriers to Discharge: Barriers Resolved   Patient Goals and CMS Choice Patient states their goals for this hospitalization and ongoing recovery are:: to go home. CMS Medicare.gov Compare Post Acute Care list provided to:: Patient Choice offered to / list presented to : Patient  Discharge Placement       Name of family member notified: Denzil- Brother Patient and family notified of of transfer: 12/12/20  Discharge Plan and Services    Post Acute Care Choice: Home Health              Readmission Risk Interventions Readmission Risk Prevention Plan 12/08/2020  Transportation Screening Complete  HRI or Home Care Consult Complete  Social Work Consult for Recovery Care Planning/Counseling Complete  Palliative Care Screening Not Applicable  Medication Review [employer]) Complete  Some recent data might be hidden

## 2020-12-12 NOTE — Progress Notes (Signed)
Occupational Therapy Treatment Patient Details Name: Jaishaun Mcnab MRN: [id number] DOB: [birth year] Today's Date: 12/12/2020   History of present illness Shameer Molstad  is a 55 y.o. male, with history of adjustment disorder, cardiopulmonary syndrome, cervical radiculopathy, chronic intractable headache, GERD, lumbar postlaminectomy syndrome, tobacco use disorder, and more presents ED with a chief complaint of altered mental status.  Apparently patient brother found him and called 911.  Patient received intranasal Narcan, and then became very agitated.  Patient is on chronic large amounts of Dilaudid and oxycodone at home for chronic back pain.  Patient would not provide any history to the ED provider, but was moving all of his extremities due to pain.  07 (he was too agitated to lay still for it so he received a couple doses of Ativan.  The time I saw him patient is very somnolent most likely secondary to Ativan.  Patient would not provide me with any history either.   OT comments  Pt demonstrates significant improvement in mobility and B UE strength. Pt now 5/5 MMT for shoulder flexion with WFL A/ROM. Pt able to complete grooming while standing at the sink and toilet ambulatory transfer with mod I level of assist using RW. Pt states he feels much better today. Pt discharge recommendation changed to home heath to ensure safety in the home environment. Pt is not recommended for further acute OT services and will be discharged to care of nursing staff for the remaining length of stay.    Recommendations for follow up therapy are one component of a multi-disciplinary discharge planning process, led by the attending physician.  Recommendations may be updated based on patient status, additional functional criteria and insurance authorization.    Follow Up Recommendations  Home health OT;Supervision - Intermittent    Equipment Recommendations  None recommended by OT          Precautions /  Restrictions Precautions Precautions: Fall Restrictions Weight Bearing Restrictions: No       Mobility Bed Mobility                    Transfers Overall transfer level: Needs assistance Equipment used: Rolling walker (2 wheeled) Transfers: Sit to/from Stand;Stand Pivot Transfers Sit to Stand: Modified independent (Device/Increase time) Stand pivot transfers: Modified independent (Device/Increase time)       General transfer comment: Much improved balance. Pt able to complete toilet transfer with RW and Mod I level of assist.    Balance Overall balance assessment: Needs assistance Sitting-balance support: Feet supported;No upper extremity supported Sitting balance-Leahy Scale: Good Sitting balance - Comments: seated EOB   Standing balance support: During functional activity;Bilateral upper extremity supported Standing balance-Leahy Scale: Good Standing balance comment: fair to good using RW                           ADL either performed or assessed with clinical judgement   ADL Overall ADL's : Needs assistance/impaired     Grooming: Modified independent;Wash/dry hands;Standing Grooming Details (indicate cue type and reason): Pt able to wash hands standing at the sink with Mod I level of assist.                 Toilet Transfer: Modified Independent;RW;Ambulation Toilet Transfer Details (indicate cue type and reason): EOB to toilet ambulatory transfer with RW Toileting- Clothing Manipulation and Hygiene: Modified independent;Sit to/from stand Toileting - Clothing Manipulation Details (indicate cue type and reason): Pt able to stand and urinate  in the toilet using RW.                               Cognition Arousal/Alertness: Awake/alert Behavior During Therapy: WFL for tasks assessed/performed Overall Cognitive Status: Within Functional Limits for tasks assessed                                                             Pertinent Vitals/ Pain       Pain Assessment: 0-10 Pain Score: 8  Pain Location: low back Pain Descriptors / Indicators: Constant Pain Intervention(s): Limited activity within patient's tolerance;Monitored during session   Progress Toward Goals  OT Goals(current goals can now be found in the care plan section)  Progress towards OT goals: Goals met/education completed, patient discharged from OT  Acute Rehab OT Goals Patient Stated Goal: return home with family to assist  Plan Discharge plan needs to be updated    Co-evaluation                                   End of Session Equipment Utilized During Treatment: Rolling walker  OT Visit Diagnosis: Unsteadiness on feet (R26.81);Muscle weakness (generalized) (M62.81)   Activity Tolerance Patient tolerated treatment well   Patient Left in bed;with call bell/phone within reach             Time: 1127-1137 OT Time Calculation (min): 10 min  Charges: OT General Charges $OT Visit: 1 Visit OT Treatments $Self Care/Home Management : 8-22 mins  Xee Hollman OT, MOT  Larey Seat 12/12/2020, 11:49 AM

## 2020-12-13 DIAGNOSIS — K219 Gastro-esophageal reflux disease without esophagitis: Secondary | ICD-10-CM | POA: Diagnosis not present

## 2020-12-13 DIAGNOSIS — Z87891 Personal history of nicotine dependence: Secondary | ICD-10-CM | POA: Diagnosis not present

## 2020-12-13 DIAGNOSIS — G9341 Metabolic encephalopathy: Secondary | ICD-10-CM | POA: Diagnosis not present

## 2020-12-13 DIAGNOSIS — M961 Postlaminectomy syndrome, not elsewhere classified: Secondary | ICD-10-CM | POA: Diagnosis not present

## 2020-12-13 DIAGNOSIS — J45909 Unspecified asthma, uncomplicated: Secondary | ICD-10-CM | POA: Diagnosis not present

## 2020-12-13 DIAGNOSIS — I10 Essential (primary) hypertension: Secondary | ICD-10-CM | POA: Diagnosis not present

## 2020-12-13 DIAGNOSIS — E876 Hypokalemia: Secondary | ICD-10-CM | POA: Diagnosis not present

## 2020-12-13 DIAGNOSIS — G43919 Migraine, unspecified, intractable, without status migrainosus: Secondary | ICD-10-CM | POA: Diagnosis not present

## 2020-12-13 DIAGNOSIS — G4700 Insomnia, unspecified: Secondary | ICD-10-CM | POA: Diagnosis not present

## 2020-12-13 DIAGNOSIS — M5412 Radiculopathy, cervical region: Secondary | ICD-10-CM | POA: Diagnosis not present

## 2020-12-13 DIAGNOSIS — G8929 Other chronic pain: Secondary | ICD-10-CM | POA: Diagnosis not present

## 2020-12-13 DIAGNOSIS — M1990 Unspecified osteoarthritis, unspecified site: Secondary | ICD-10-CM | POA: Diagnosis not present

## 2020-12-13 DIAGNOSIS — A419 Sepsis, unspecified organism: Secondary | ICD-10-CM | POA: Diagnosis not present

## 2020-12-13 DIAGNOSIS — N10 Acute tubulo-interstitial nephritis: Secondary | ICD-10-CM | POA: Diagnosis not present

## 2020-12-13 DIAGNOSIS — M6282 Rhabdomyolysis: Secondary | ICD-10-CM | POA: Diagnosis not present

## 2020-12-13 DIAGNOSIS — Z79891 Long term (current) use of opiate analgesic: Secondary | ICD-10-CM | POA: Diagnosis not present

## 2020-12-14 DIAGNOSIS — G43919 Migraine, unspecified, intractable, without status migrainosus: Secondary | ICD-10-CM | POA: Diagnosis not present

## 2020-12-14 DIAGNOSIS — M961 Postlaminectomy syndrome, not elsewhere classified: Secondary | ICD-10-CM | POA: Diagnosis not present

## 2020-12-14 DIAGNOSIS — M1990 Unspecified osteoarthritis, unspecified site: Secondary | ICD-10-CM | POA: Diagnosis not present

## 2020-12-14 DIAGNOSIS — M6282 Rhabdomyolysis: Secondary | ICD-10-CM | POA: Diagnosis not present

## 2020-12-14 DIAGNOSIS — K219 Gastro-esophageal reflux disease without esophagitis: Secondary | ICD-10-CM | POA: Diagnosis not present

## 2020-12-14 DIAGNOSIS — J45909 Unspecified asthma, uncomplicated: Secondary | ICD-10-CM | POA: Diagnosis not present

## 2020-12-14 DIAGNOSIS — Z79891 Long term (current) use of opiate analgesic: Secondary | ICD-10-CM | POA: Diagnosis not present

## 2020-12-14 DIAGNOSIS — M5412 Radiculopathy, cervical region: Secondary | ICD-10-CM | POA: Diagnosis not present

## 2020-12-14 DIAGNOSIS — G8929 Other chronic pain: Secondary | ICD-10-CM | POA: Diagnosis not present

## 2020-12-14 DIAGNOSIS — Z87891 Personal history of nicotine dependence: Secondary | ICD-10-CM | POA: Diagnosis not present

## 2020-12-14 DIAGNOSIS — E876 Hypokalemia: Secondary | ICD-10-CM | POA: Diagnosis not present

## 2020-12-14 DIAGNOSIS — G9341 Metabolic encephalopathy: Secondary | ICD-10-CM | POA: Diagnosis not present

## 2020-12-14 DIAGNOSIS — G4700 Insomnia, unspecified: Secondary | ICD-10-CM | POA: Diagnosis not present

## 2020-12-14 DIAGNOSIS — N10 Acute tubulo-interstitial nephritis: Secondary | ICD-10-CM | POA: Diagnosis not present

## 2020-12-14 DIAGNOSIS — I10 Essential (primary) hypertension: Secondary | ICD-10-CM | POA: Diagnosis not present

## 2020-12-14 DIAGNOSIS — A419 Sepsis, unspecified organism: Secondary | ICD-10-CM | POA: Diagnosis not present

## 2020-12-18 DIAGNOSIS — I10 Essential (primary) hypertension: Secondary | ICD-10-CM | POA: Diagnosis not present

## 2020-12-18 DIAGNOSIS — G4700 Insomnia, unspecified: Secondary | ICD-10-CM | POA: Diagnosis not present

## 2020-12-18 DIAGNOSIS — M5412 Radiculopathy, cervical region: Secondary | ICD-10-CM | POA: Diagnosis not present

## 2020-12-18 DIAGNOSIS — M961 Postlaminectomy syndrome, not elsewhere classified: Secondary | ICD-10-CM | POA: Diagnosis not present

## 2020-12-18 DIAGNOSIS — A419 Sepsis, unspecified organism: Secondary | ICD-10-CM | POA: Diagnosis not present

## 2020-12-18 DIAGNOSIS — Z79891 Long term (current) use of opiate analgesic: Secondary | ICD-10-CM | POA: Diagnosis not present

## 2020-12-18 DIAGNOSIS — Z87891 Personal history of nicotine dependence: Secondary | ICD-10-CM | POA: Diagnosis not present

## 2020-12-18 DIAGNOSIS — M1990 Unspecified osteoarthritis, unspecified site: Secondary | ICD-10-CM | POA: Diagnosis not present

## 2020-12-18 DIAGNOSIS — N10 Acute tubulo-interstitial nephritis: Secondary | ICD-10-CM | POA: Diagnosis not present

## 2020-12-18 DIAGNOSIS — J45909 Unspecified asthma, uncomplicated: Secondary | ICD-10-CM | POA: Diagnosis not present

## 2020-12-18 DIAGNOSIS — M6282 Rhabdomyolysis: Secondary | ICD-10-CM | POA: Diagnosis not present

## 2020-12-18 DIAGNOSIS — K219 Gastro-esophageal reflux disease without esophagitis: Secondary | ICD-10-CM | POA: Diagnosis not present

## 2020-12-18 DIAGNOSIS — G43919 Migraine, unspecified, intractable, without status migrainosus: Secondary | ICD-10-CM | POA: Diagnosis not present

## 2020-12-18 DIAGNOSIS — G8929 Other chronic pain: Secondary | ICD-10-CM | POA: Diagnosis not present

## 2020-12-18 DIAGNOSIS — G9341 Metabolic encephalopathy: Secondary | ICD-10-CM | POA: Diagnosis not present

## 2020-12-18 DIAGNOSIS — E876 Hypokalemia: Secondary | ICD-10-CM | POA: Diagnosis not present

## 2020-12-19 DIAGNOSIS — G8929 Other chronic pain: Secondary | ICD-10-CM | POA: Diagnosis not present

## 2020-12-19 DIAGNOSIS — M1990 Unspecified osteoarthritis, unspecified site: Secondary | ICD-10-CM | POA: Diagnosis not present

## 2020-12-19 DIAGNOSIS — G4700 Insomnia, unspecified: Secondary | ICD-10-CM | POA: Diagnosis not present

## 2020-12-19 DIAGNOSIS — M961 Postlaminectomy syndrome, not elsewhere classified: Secondary | ICD-10-CM | POA: Diagnosis not present

## 2020-12-19 DIAGNOSIS — K219 Gastro-esophageal reflux disease without esophagitis: Secondary | ICD-10-CM | POA: Diagnosis not present

## 2020-12-19 DIAGNOSIS — J45909 Unspecified asthma, uncomplicated: Secondary | ICD-10-CM | POA: Diagnosis not present

## 2020-12-19 DIAGNOSIS — M6282 Rhabdomyolysis: Secondary | ICD-10-CM | POA: Diagnosis not present

## 2020-12-19 DIAGNOSIS — N10 Acute tubulo-interstitial nephritis: Secondary | ICD-10-CM | POA: Diagnosis not present

## 2020-12-19 DIAGNOSIS — G43919 Migraine, unspecified, intractable, without status migrainosus: Secondary | ICD-10-CM | POA: Diagnosis not present

## 2020-12-19 DIAGNOSIS — G9341 Metabolic encephalopathy: Secondary | ICD-10-CM | POA: Diagnosis not present

## 2020-12-19 DIAGNOSIS — A419 Sepsis, unspecified organism: Secondary | ICD-10-CM | POA: Diagnosis not present

## 2020-12-19 DIAGNOSIS — E876 Hypokalemia: Secondary | ICD-10-CM | POA: Diagnosis not present

## 2020-12-19 DIAGNOSIS — Z87891 Personal history of nicotine dependence: Secondary | ICD-10-CM | POA: Diagnosis not present

## 2020-12-19 DIAGNOSIS — M5412 Radiculopathy, cervical region: Secondary | ICD-10-CM | POA: Diagnosis not present

## 2020-12-19 DIAGNOSIS — Z79891 Long term (current) use of opiate analgesic: Secondary | ICD-10-CM | POA: Diagnosis not present

## 2020-12-19 DIAGNOSIS — I10 Essential (primary) hypertension: Secondary | ICD-10-CM | POA: Diagnosis not present

## 2020-12-20 DIAGNOSIS — M5412 Radiculopathy, cervical region: Secondary | ICD-10-CM | POA: Diagnosis not present

## 2020-12-20 DIAGNOSIS — J45909 Unspecified asthma, uncomplicated: Secondary | ICD-10-CM | POA: Diagnosis not present

## 2020-12-20 DIAGNOSIS — Z79891 Long term (current) use of opiate analgesic: Secondary | ICD-10-CM | POA: Diagnosis not present

## 2020-12-20 DIAGNOSIS — M961 Postlaminectomy syndrome, not elsewhere classified: Secondary | ICD-10-CM | POA: Diagnosis not present

## 2020-12-20 DIAGNOSIS — G8929 Other chronic pain: Secondary | ICD-10-CM | POA: Diagnosis not present

## 2020-12-20 DIAGNOSIS — K219 Gastro-esophageal reflux disease without esophagitis: Secondary | ICD-10-CM | POA: Diagnosis not present

## 2020-12-20 DIAGNOSIS — G43919 Migraine, unspecified, intractable, without status migrainosus: Secondary | ICD-10-CM | POA: Diagnosis not present

## 2020-12-20 DIAGNOSIS — G9341 Metabolic encephalopathy: Secondary | ICD-10-CM | POA: Diagnosis not present

## 2020-12-20 DIAGNOSIS — Z87891 Personal history of nicotine dependence: Secondary | ICD-10-CM | POA: Diagnosis not present

## 2020-12-20 DIAGNOSIS — M6282 Rhabdomyolysis: Secondary | ICD-10-CM | POA: Diagnosis not present

## 2020-12-20 DIAGNOSIS — E876 Hypokalemia: Secondary | ICD-10-CM | POA: Diagnosis not present

## 2020-12-20 DIAGNOSIS — M1990 Unspecified osteoarthritis, unspecified site: Secondary | ICD-10-CM | POA: Diagnosis not present

## 2020-12-20 DIAGNOSIS — I10 Essential (primary) hypertension: Secondary | ICD-10-CM | POA: Diagnosis not present

## 2020-12-20 DIAGNOSIS — N10 Acute tubulo-interstitial nephritis: Secondary | ICD-10-CM | POA: Diagnosis not present

## 2020-12-20 DIAGNOSIS — A419 Sepsis, unspecified organism: Secondary | ICD-10-CM | POA: Diagnosis not present

## 2020-12-20 DIAGNOSIS — G4700 Insomnia, unspecified: Secondary | ICD-10-CM | POA: Diagnosis not present

## 2020-12-21 ENCOUNTER — Other Ambulatory Visit: Payer: Medicare Other

## 2020-12-21 DIAGNOSIS — M1990 Unspecified osteoarthritis, unspecified site: Secondary | ICD-10-CM | POA: Diagnosis not present

## 2020-12-21 DIAGNOSIS — I10 Essential (primary) hypertension: Secondary | ICD-10-CM | POA: Diagnosis not present

## 2020-12-21 DIAGNOSIS — G9341 Metabolic encephalopathy: Secondary | ICD-10-CM | POA: Diagnosis not present

## 2020-12-21 DIAGNOSIS — Z87891 Personal history of nicotine dependence: Secondary | ICD-10-CM | POA: Diagnosis not present

## 2020-12-21 DIAGNOSIS — Z79891 Long term (current) use of opiate analgesic: Secondary | ICD-10-CM | POA: Diagnosis not present

## 2020-12-21 DIAGNOSIS — J45909 Unspecified asthma, uncomplicated: Secondary | ICD-10-CM | POA: Diagnosis not present

## 2020-12-21 DIAGNOSIS — K219 Gastro-esophageal reflux disease without esophagitis: Secondary | ICD-10-CM | POA: Diagnosis not present

## 2020-12-21 DIAGNOSIS — M961 Postlaminectomy syndrome, not elsewhere classified: Secondary | ICD-10-CM | POA: Diagnosis not present

## 2020-12-21 DIAGNOSIS — A419 Sepsis, unspecified organism: Secondary | ICD-10-CM | POA: Diagnosis not present

## 2020-12-21 DIAGNOSIS — G43919 Migraine, unspecified, intractable, without status migrainosus: Secondary | ICD-10-CM | POA: Diagnosis not present

## 2020-12-21 DIAGNOSIS — N10 Acute tubulo-interstitial nephritis: Secondary | ICD-10-CM | POA: Diagnosis not present

## 2020-12-21 DIAGNOSIS — E876 Hypokalemia: Secondary | ICD-10-CM | POA: Diagnosis not present

## 2020-12-21 DIAGNOSIS — G4700 Insomnia, unspecified: Secondary | ICD-10-CM | POA: Diagnosis not present

## 2020-12-21 DIAGNOSIS — M6282 Rhabdomyolysis: Secondary | ICD-10-CM | POA: Diagnosis not present

## 2020-12-21 DIAGNOSIS — G8929 Other chronic pain: Secondary | ICD-10-CM | POA: Diagnosis not present

## 2020-12-21 DIAGNOSIS — M5412 Radiculopathy, cervical region: Secondary | ICD-10-CM | POA: Diagnosis not present

## 2020-12-22 ENCOUNTER — Ambulatory Visit: Payer: Medicare Other | Admitting: Cardiology

## 2020-12-25 DIAGNOSIS — J45909 Unspecified asthma, uncomplicated: Secondary | ICD-10-CM | POA: Diagnosis not present

## 2020-12-25 DIAGNOSIS — G4700 Insomnia, unspecified: Secondary | ICD-10-CM | POA: Diagnosis not present

## 2020-12-25 DIAGNOSIS — G43919 Migraine, unspecified, intractable, without status migrainosus: Secondary | ICD-10-CM | POA: Diagnosis not present

## 2020-12-25 DIAGNOSIS — G8929 Other chronic pain: Secondary | ICD-10-CM | POA: Diagnosis not present

## 2020-12-25 DIAGNOSIS — M6282 Rhabdomyolysis: Secondary | ICD-10-CM | POA: Diagnosis not present

## 2020-12-25 DIAGNOSIS — Z79891 Long term (current) use of opiate analgesic: Secondary | ICD-10-CM | POA: Diagnosis not present

## 2020-12-25 DIAGNOSIS — A419 Sepsis, unspecified organism: Secondary | ICD-10-CM | POA: Diagnosis not present

## 2020-12-25 DIAGNOSIS — M961 Postlaminectomy syndrome, not elsewhere classified: Secondary | ICD-10-CM | POA: Diagnosis not present

## 2020-12-25 DIAGNOSIS — K219 Gastro-esophageal reflux disease without esophagitis: Secondary | ICD-10-CM | POA: Diagnosis not present

## 2020-12-25 DIAGNOSIS — E876 Hypokalemia: Secondary | ICD-10-CM | POA: Diagnosis not present

## 2020-12-25 DIAGNOSIS — M5412 Radiculopathy, cervical region: Secondary | ICD-10-CM | POA: Diagnosis not present

## 2020-12-25 DIAGNOSIS — M1990 Unspecified osteoarthritis, unspecified site: Secondary | ICD-10-CM | POA: Diagnosis not present

## 2020-12-25 DIAGNOSIS — N10 Acute tubulo-interstitial nephritis: Secondary | ICD-10-CM | POA: Diagnosis not present

## 2020-12-25 DIAGNOSIS — I10 Essential (primary) hypertension: Secondary | ICD-10-CM | POA: Diagnosis not present

## 2020-12-25 DIAGNOSIS — Z87891 Personal history of nicotine dependence: Secondary | ICD-10-CM | POA: Diagnosis not present

## 2020-12-25 DIAGNOSIS — G9341 Metabolic encephalopathy: Secondary | ICD-10-CM | POA: Diagnosis not present

## 2020-12-26 DIAGNOSIS — G9341 Metabolic encephalopathy: Secondary | ICD-10-CM | POA: Diagnosis not present

## 2020-12-26 DIAGNOSIS — G4700 Insomnia, unspecified: Secondary | ICD-10-CM | POA: Diagnosis not present

## 2020-12-26 DIAGNOSIS — N10 Acute tubulo-interstitial nephritis: Secondary | ICD-10-CM | POA: Diagnosis not present

## 2020-12-26 DIAGNOSIS — R5383 Other fatigue: Secondary | ICD-10-CM | POA: Diagnosis not present

## 2020-12-26 DIAGNOSIS — A419 Sepsis, unspecified organism: Secondary | ICD-10-CM | POA: Diagnosis not present

## 2020-12-26 DIAGNOSIS — M6282 Rhabdomyolysis: Secondary | ICD-10-CM | POA: Diagnosis not present

## 2020-12-26 DIAGNOSIS — R6889 Other general symptoms and signs: Secondary | ICD-10-CM | POA: Diagnosis not present

## 2020-12-26 DIAGNOSIS — G894 Chronic pain syndrome: Secondary | ICD-10-CM | POA: Diagnosis not present

## 2020-12-27 DIAGNOSIS — G8929 Other chronic pain: Secondary | ICD-10-CM | POA: Diagnosis not present

## 2020-12-27 DIAGNOSIS — J45909 Unspecified asthma, uncomplicated: Secondary | ICD-10-CM | POA: Diagnosis not present

## 2020-12-27 DIAGNOSIS — E876 Hypokalemia: Secondary | ICD-10-CM | POA: Diagnosis not present

## 2020-12-27 DIAGNOSIS — M1990 Unspecified osteoarthritis, unspecified site: Secondary | ICD-10-CM | POA: Diagnosis not present

## 2020-12-27 DIAGNOSIS — G4700 Insomnia, unspecified: Secondary | ICD-10-CM | POA: Diagnosis not present

## 2020-12-27 DIAGNOSIS — G9341 Metabolic encephalopathy: Secondary | ICD-10-CM | POA: Diagnosis not present

## 2020-12-27 DIAGNOSIS — M6282 Rhabdomyolysis: Secondary | ICD-10-CM | POA: Diagnosis not present

## 2020-12-27 DIAGNOSIS — M5412 Radiculopathy, cervical region: Secondary | ICD-10-CM | POA: Diagnosis not present

## 2020-12-27 DIAGNOSIS — I10 Essential (primary) hypertension: Secondary | ICD-10-CM | POA: Diagnosis not present

## 2020-12-27 DIAGNOSIS — A419 Sepsis, unspecified organism: Secondary | ICD-10-CM | POA: Diagnosis not present

## 2020-12-27 DIAGNOSIS — M961 Postlaminectomy syndrome, not elsewhere classified: Secondary | ICD-10-CM | POA: Diagnosis not present

## 2020-12-27 DIAGNOSIS — G43919 Migraine, unspecified, intractable, without status migrainosus: Secondary | ICD-10-CM | POA: Diagnosis not present

## 2020-12-27 DIAGNOSIS — K219 Gastro-esophageal reflux disease without esophagitis: Secondary | ICD-10-CM | POA: Diagnosis not present

## 2020-12-27 DIAGNOSIS — N10 Acute tubulo-interstitial nephritis: Secondary | ICD-10-CM | POA: Diagnosis not present

## 2020-12-27 DIAGNOSIS — Z79891 Long term (current) use of opiate analgesic: Secondary | ICD-10-CM | POA: Diagnosis not present

## 2020-12-27 DIAGNOSIS — Z87891 Personal history of nicotine dependence: Secondary | ICD-10-CM | POA: Diagnosis not present

## 2020-12-28 DIAGNOSIS — J45909 Unspecified asthma, uncomplicated: Secondary | ICD-10-CM | POA: Diagnosis not present

## 2020-12-28 DIAGNOSIS — A419 Sepsis, unspecified organism: Secondary | ICD-10-CM | POA: Diagnosis not present

## 2020-12-28 DIAGNOSIS — I10 Essential (primary) hypertension: Secondary | ICD-10-CM | POA: Diagnosis not present

## 2020-12-28 DIAGNOSIS — G8929 Other chronic pain: Secondary | ICD-10-CM | POA: Diagnosis not present

## 2020-12-28 DIAGNOSIS — Z79891 Long term (current) use of opiate analgesic: Secondary | ICD-10-CM | POA: Diagnosis not present

## 2020-12-28 DIAGNOSIS — M1990 Unspecified osteoarthritis, unspecified site: Secondary | ICD-10-CM | POA: Diagnosis not present

## 2020-12-28 DIAGNOSIS — G9341 Metabolic encephalopathy: Secondary | ICD-10-CM | POA: Diagnosis not present

## 2020-12-28 DIAGNOSIS — G4700 Insomnia, unspecified: Secondary | ICD-10-CM | POA: Diagnosis not present

## 2020-12-28 DIAGNOSIS — M961 Postlaminectomy syndrome, not elsewhere classified: Secondary | ICD-10-CM | POA: Diagnosis not present

## 2020-12-28 DIAGNOSIS — K219 Gastro-esophageal reflux disease without esophagitis: Secondary | ICD-10-CM | POA: Diagnosis not present

## 2020-12-28 DIAGNOSIS — E876 Hypokalemia: Secondary | ICD-10-CM | POA: Diagnosis not present

## 2020-12-28 DIAGNOSIS — M5412 Radiculopathy, cervical region: Secondary | ICD-10-CM | POA: Diagnosis not present

## 2020-12-28 DIAGNOSIS — G43919 Migraine, unspecified, intractable, without status migrainosus: Secondary | ICD-10-CM | POA: Diagnosis not present

## 2020-12-28 DIAGNOSIS — M6282 Rhabdomyolysis: Secondary | ICD-10-CM | POA: Diagnosis not present

## 2020-12-28 DIAGNOSIS — Z87891 Personal history of nicotine dependence: Secondary | ICD-10-CM | POA: Diagnosis not present

## 2020-12-28 DIAGNOSIS — N10 Acute tubulo-interstitial nephritis: Secondary | ICD-10-CM | POA: Diagnosis not present

## 2021-01-01 DIAGNOSIS — A419 Sepsis, unspecified organism: Secondary | ICD-10-CM | POA: Diagnosis not present

## 2021-01-01 DIAGNOSIS — Z87891 Personal history of nicotine dependence: Secondary | ICD-10-CM | POA: Diagnosis not present

## 2021-01-01 DIAGNOSIS — G9341 Metabolic encephalopathy: Secondary | ICD-10-CM | POA: Diagnosis not present

## 2021-01-01 DIAGNOSIS — G8929 Other chronic pain: Secondary | ICD-10-CM | POA: Diagnosis not present

## 2021-01-01 DIAGNOSIS — K219 Gastro-esophageal reflux disease without esophagitis: Secondary | ICD-10-CM | POA: Diagnosis not present

## 2021-01-01 DIAGNOSIS — N10 Acute tubulo-interstitial nephritis: Secondary | ICD-10-CM | POA: Diagnosis not present

## 2021-01-01 DIAGNOSIS — M5412 Radiculopathy, cervical region: Secondary | ICD-10-CM | POA: Diagnosis not present

## 2021-01-01 DIAGNOSIS — I10 Essential (primary) hypertension: Secondary | ICD-10-CM | POA: Diagnosis not present

## 2021-01-01 DIAGNOSIS — M961 Postlaminectomy syndrome, not elsewhere classified: Secondary | ICD-10-CM | POA: Diagnosis not present

## 2021-01-01 DIAGNOSIS — M6282 Rhabdomyolysis: Secondary | ICD-10-CM | POA: Diagnosis not present

## 2021-01-01 DIAGNOSIS — G4700 Insomnia, unspecified: Secondary | ICD-10-CM | POA: Diagnosis not present

## 2021-01-01 DIAGNOSIS — M1990 Unspecified osteoarthritis, unspecified site: Secondary | ICD-10-CM | POA: Diagnosis not present

## 2021-01-01 DIAGNOSIS — G43919 Migraine, unspecified, intractable, without status migrainosus: Secondary | ICD-10-CM | POA: Diagnosis not present

## 2021-01-01 DIAGNOSIS — J45909 Unspecified asthma, uncomplicated: Secondary | ICD-10-CM | POA: Diagnosis not present

## 2021-01-01 DIAGNOSIS — Z79891 Long term (current) use of opiate analgesic: Secondary | ICD-10-CM | POA: Diagnosis not present

## 2021-01-01 DIAGNOSIS — E876 Hypokalemia: Secondary | ICD-10-CM | POA: Diagnosis not present

## 2021-01-03 DIAGNOSIS — G9341 Metabolic encephalopathy: Secondary | ICD-10-CM | POA: Diagnosis not present

## 2021-01-03 DIAGNOSIS — I10 Essential (primary) hypertension: Secondary | ICD-10-CM | POA: Diagnosis not present

## 2021-01-03 DIAGNOSIS — M1990 Unspecified osteoarthritis, unspecified site: Secondary | ICD-10-CM | POA: Diagnosis not present

## 2021-01-03 DIAGNOSIS — N10 Acute tubulo-interstitial nephritis: Secondary | ICD-10-CM | POA: Diagnosis not present

## 2021-01-03 DIAGNOSIS — G8929 Other chronic pain: Secondary | ICD-10-CM | POA: Diagnosis not present

## 2021-01-03 DIAGNOSIS — M5412 Radiculopathy, cervical region: Secondary | ICD-10-CM | POA: Diagnosis not present

## 2021-01-03 DIAGNOSIS — G4700 Insomnia, unspecified: Secondary | ICD-10-CM | POA: Diagnosis not present

## 2021-01-03 DIAGNOSIS — M961 Postlaminectomy syndrome, not elsewhere classified: Secondary | ICD-10-CM | POA: Diagnosis not present

## 2021-01-03 DIAGNOSIS — M6282 Rhabdomyolysis: Secondary | ICD-10-CM | POA: Diagnosis not present

## 2021-01-03 DIAGNOSIS — A419 Sepsis, unspecified organism: Secondary | ICD-10-CM | POA: Diagnosis not present

## 2021-01-03 DIAGNOSIS — G43919 Migraine, unspecified, intractable, without status migrainosus: Secondary | ICD-10-CM | POA: Diagnosis not present

## 2021-01-03 DIAGNOSIS — Z87891 Personal history of nicotine dependence: Secondary | ICD-10-CM | POA: Diagnosis not present

## 2021-01-03 DIAGNOSIS — E876 Hypokalemia: Secondary | ICD-10-CM | POA: Diagnosis not present

## 2021-01-03 DIAGNOSIS — K219 Gastro-esophageal reflux disease without esophagitis: Secondary | ICD-10-CM | POA: Diagnosis not present

## 2021-01-03 DIAGNOSIS — J45909 Unspecified asthma, uncomplicated: Secondary | ICD-10-CM | POA: Diagnosis not present

## 2021-01-03 DIAGNOSIS — Z79891 Long term (current) use of opiate analgesic: Secondary | ICD-10-CM | POA: Diagnosis not present

## 2021-01-05 DIAGNOSIS — G43919 Migraine, unspecified, intractable, without status migrainosus: Secondary | ICD-10-CM | POA: Diagnosis not present

## 2021-01-05 DIAGNOSIS — A419 Sepsis, unspecified organism: Secondary | ICD-10-CM | POA: Diagnosis not present

## 2021-01-05 DIAGNOSIS — K219 Gastro-esophageal reflux disease without esophagitis: Secondary | ICD-10-CM | POA: Diagnosis not present

## 2021-01-05 DIAGNOSIS — G4700 Insomnia, unspecified: Secondary | ICD-10-CM | POA: Diagnosis not present

## 2021-01-05 DIAGNOSIS — N10 Acute tubulo-interstitial nephritis: Secondary | ICD-10-CM | POA: Diagnosis not present

## 2021-01-05 DIAGNOSIS — Z79891 Long term (current) use of opiate analgesic: Secondary | ICD-10-CM | POA: Diagnosis not present

## 2021-01-05 DIAGNOSIS — G8929 Other chronic pain: Secondary | ICD-10-CM | POA: Diagnosis not present

## 2021-01-05 DIAGNOSIS — I10 Essential (primary) hypertension: Secondary | ICD-10-CM | POA: Diagnosis not present

## 2021-01-05 DIAGNOSIS — M5412 Radiculopathy, cervical region: Secondary | ICD-10-CM | POA: Diagnosis not present

## 2021-01-05 DIAGNOSIS — M961 Postlaminectomy syndrome, not elsewhere classified: Secondary | ICD-10-CM | POA: Diagnosis not present

## 2021-01-05 DIAGNOSIS — M1990 Unspecified osteoarthritis, unspecified site: Secondary | ICD-10-CM | POA: Diagnosis not present

## 2021-01-05 DIAGNOSIS — G9341 Metabolic encephalopathy: Secondary | ICD-10-CM | POA: Diagnosis not present

## 2021-01-05 DIAGNOSIS — Z87891 Personal history of nicotine dependence: Secondary | ICD-10-CM | POA: Diagnosis not present

## 2021-01-05 DIAGNOSIS — M6282 Rhabdomyolysis: Secondary | ICD-10-CM | POA: Diagnosis not present

## 2021-01-05 DIAGNOSIS — J45909 Unspecified asthma, uncomplicated: Secondary | ICD-10-CM | POA: Diagnosis not present

## 2021-01-05 DIAGNOSIS — E876 Hypokalemia: Secondary | ICD-10-CM | POA: Diagnosis not present

## 2021-01-09 DIAGNOSIS — E876 Hypokalemia: Secondary | ICD-10-CM | POA: Diagnosis not present

## 2021-01-09 DIAGNOSIS — K219 Gastro-esophageal reflux disease without esophagitis: Secondary | ICD-10-CM | POA: Diagnosis not present

## 2021-01-09 DIAGNOSIS — J45909 Unspecified asthma, uncomplicated: Secondary | ICD-10-CM | POA: Diagnosis not present

## 2021-01-09 DIAGNOSIS — M961 Postlaminectomy syndrome, not elsewhere classified: Secondary | ICD-10-CM | POA: Diagnosis not present

## 2021-01-09 DIAGNOSIS — G8929 Other chronic pain: Secondary | ICD-10-CM | POA: Diagnosis not present

## 2021-01-09 DIAGNOSIS — I10 Essential (primary) hypertension: Secondary | ICD-10-CM | POA: Diagnosis not present

## 2021-01-09 DIAGNOSIS — Z87891 Personal history of nicotine dependence: Secondary | ICD-10-CM | POA: Diagnosis not present

## 2021-01-09 DIAGNOSIS — G4700 Insomnia, unspecified: Secondary | ICD-10-CM | POA: Diagnosis not present

## 2021-01-09 DIAGNOSIS — M6282 Rhabdomyolysis: Secondary | ICD-10-CM | POA: Diagnosis not present

## 2021-01-09 DIAGNOSIS — N10 Acute tubulo-interstitial nephritis: Secondary | ICD-10-CM | POA: Diagnosis not present

## 2021-01-09 DIAGNOSIS — M1990 Unspecified osteoarthritis, unspecified site: Secondary | ICD-10-CM | POA: Diagnosis not present

## 2021-01-09 DIAGNOSIS — G43919 Migraine, unspecified, intractable, without status migrainosus: Secondary | ICD-10-CM | POA: Diagnosis not present

## 2021-01-09 DIAGNOSIS — G9341 Metabolic encephalopathy: Secondary | ICD-10-CM | POA: Diagnosis not present

## 2021-01-09 DIAGNOSIS — A419 Sepsis, unspecified organism: Secondary | ICD-10-CM | POA: Diagnosis not present

## 2021-01-09 DIAGNOSIS — Z79891 Long term (current) use of opiate analgesic: Secondary | ICD-10-CM | POA: Diagnosis not present

## 2021-01-09 DIAGNOSIS — M5412 Radiculopathy, cervical region: Secondary | ICD-10-CM | POA: Diagnosis not present

## 2021-01-11 DIAGNOSIS — G8929 Other chronic pain: Secondary | ICD-10-CM | POA: Diagnosis not present

## 2021-01-11 DIAGNOSIS — Z87891 Personal history of nicotine dependence: Secondary | ICD-10-CM | POA: Diagnosis not present

## 2021-01-11 DIAGNOSIS — M1990 Unspecified osteoarthritis, unspecified site: Secondary | ICD-10-CM | POA: Diagnosis not present

## 2021-01-11 DIAGNOSIS — G43919 Migraine, unspecified, intractable, without status migrainosus: Secondary | ICD-10-CM | POA: Diagnosis not present

## 2021-01-11 DIAGNOSIS — N10 Acute tubulo-interstitial nephritis: Secondary | ICD-10-CM | POA: Diagnosis not present

## 2021-01-11 DIAGNOSIS — M6282 Rhabdomyolysis: Secondary | ICD-10-CM | POA: Diagnosis not present

## 2021-01-11 DIAGNOSIS — I10 Essential (primary) hypertension: Secondary | ICD-10-CM | POA: Diagnosis not present

## 2021-01-11 DIAGNOSIS — M961 Postlaminectomy syndrome, not elsewhere classified: Secondary | ICD-10-CM | POA: Diagnosis not present

## 2021-01-11 DIAGNOSIS — Z79891 Long term (current) use of opiate analgesic: Secondary | ICD-10-CM | POA: Diagnosis not present

## 2021-01-11 DIAGNOSIS — K219 Gastro-esophageal reflux disease without esophagitis: Secondary | ICD-10-CM | POA: Diagnosis not present

## 2021-01-11 DIAGNOSIS — M5412 Radiculopathy, cervical region: Secondary | ICD-10-CM | POA: Diagnosis not present

## 2021-01-11 DIAGNOSIS — A419 Sepsis, unspecified organism: Secondary | ICD-10-CM | POA: Diagnosis not present

## 2021-01-11 DIAGNOSIS — G9341 Metabolic encephalopathy: Secondary | ICD-10-CM | POA: Diagnosis not present

## 2021-01-11 DIAGNOSIS — G4700 Insomnia, unspecified: Secondary | ICD-10-CM | POA: Diagnosis not present

## 2021-01-11 DIAGNOSIS — E876 Hypokalemia: Secondary | ICD-10-CM | POA: Diagnosis not present

## 2021-01-11 DIAGNOSIS — J45909 Unspecified asthma, uncomplicated: Secondary | ICD-10-CM | POA: Diagnosis not present

## 2021-01-25 ENCOUNTER — Other Ambulatory Visit: Payer: Medicare Other

## 2021-06-13 DIAGNOSIS — Z885 Allergy status to narcotic agent status: Secondary | ICD-10-CM | POA: Diagnosis not present

## 2021-06-13 DIAGNOSIS — R29818 Other symptoms and signs involving the nervous system: Secondary | ICD-10-CM | POA: Diagnosis not present

## 2021-06-13 DIAGNOSIS — M549 Dorsalgia, unspecified: Secondary | ICD-10-CM | POA: Diagnosis not present

## 2021-06-13 DIAGNOSIS — M542 Cervicalgia: Secondary | ICD-10-CM | POA: Diagnosis not present

## 2021-06-13 DIAGNOSIS — M79601 Pain in right arm: Secondary | ICD-10-CM | POA: Diagnosis not present

## 2021-06-18 DIAGNOSIS — J00 Acute nasopharyngitis [common cold]: Secondary | ICD-10-CM | POA: Diagnosis not present

## 2021-06-18 DIAGNOSIS — Z6831 Body mass index (BMI) 31.0-31.9, adult: Secondary | ICD-10-CM | POA: Diagnosis not present

## 2021-06-18 DIAGNOSIS — E669 Obesity, unspecified: Secondary | ICD-10-CM | POA: Diagnosis not present

## 2021-06-18 DIAGNOSIS — G8929 Other chronic pain: Secondary | ICD-10-CM | POA: Diagnosis not present

## 2021-06-18 DIAGNOSIS — R69 Illness, unspecified: Secondary | ICD-10-CM | POA: Diagnosis not present

## 2021-06-29 ENCOUNTER — Encounter: Payer: Self-pay | Admitting: Cardiology

## 2021-06-29 NOTE — Progress Notes (Signed)
UNABLE TO CONTACT PATIENT, SENT LETTER, ORDER CANCELLED.

## 2021-10-31 IMAGING — CT CT MAXILLOFACIAL W/O CM
3 series · 16 of 47 positions shown, 19 images · non-contrast
Comparison: None.

CLINICAL DATA: Delirium

EXAM:
CT HEAD WITHOUT CONTRAST
CT MAXILLOFACIAL WITHOUT CONTRAST
TECHNIQUE: Multidetector CT imaging of the head and maxillofacial structures
were performed using the standard protocol without intravenous
contrast. Multiplanar CT image reconstructions of the maxillofacial
structures were also generated.

[Series 6: max soft · axial · 0.33mm/px · z∈[-132,-20]mm · 10 of 66 slices shown, 13 images]
[im 5/66  brain]
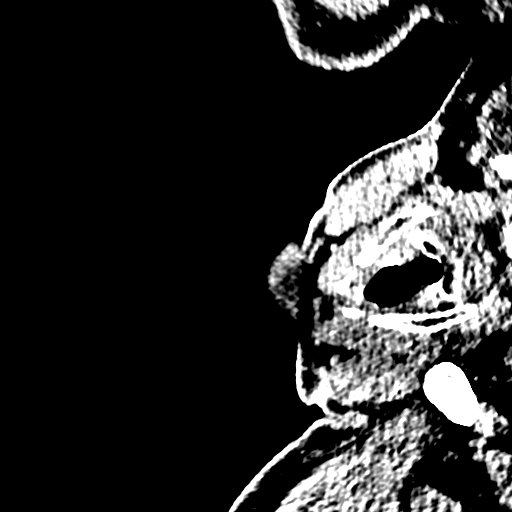
[im 5/66  bone]
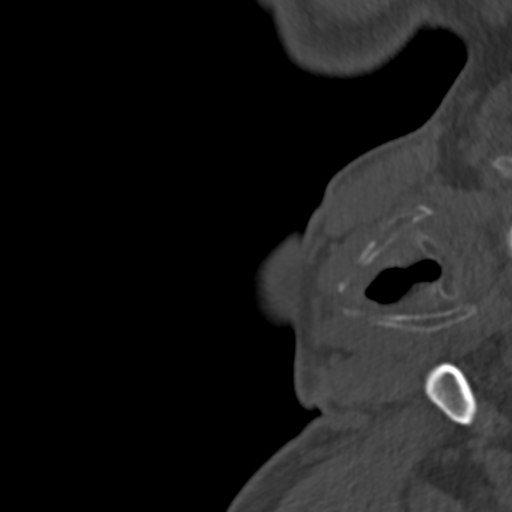
[im 12/66  bone]
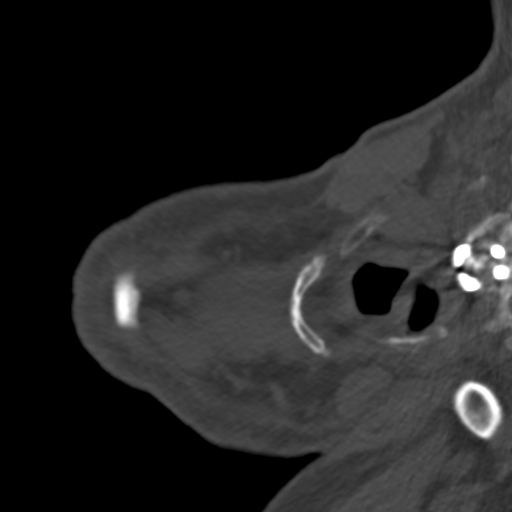
[im 18/66  bone]
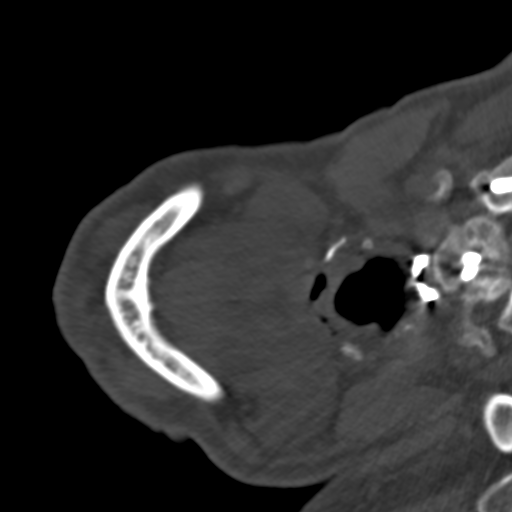
[im 23/66  bone]
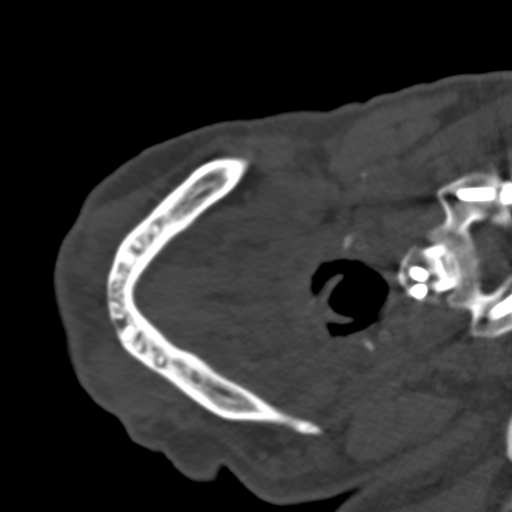
[im 30/66  brain]
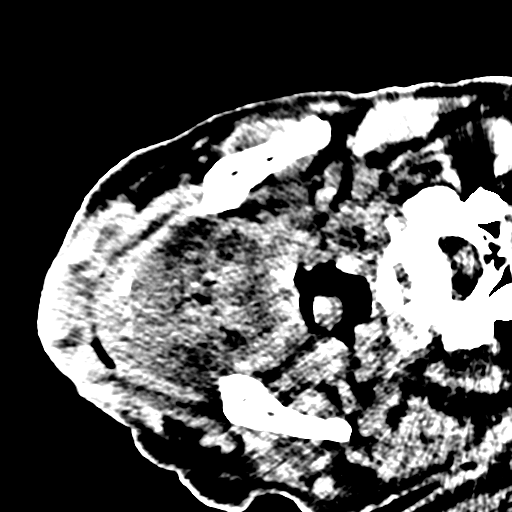
[im 30/66  bone]
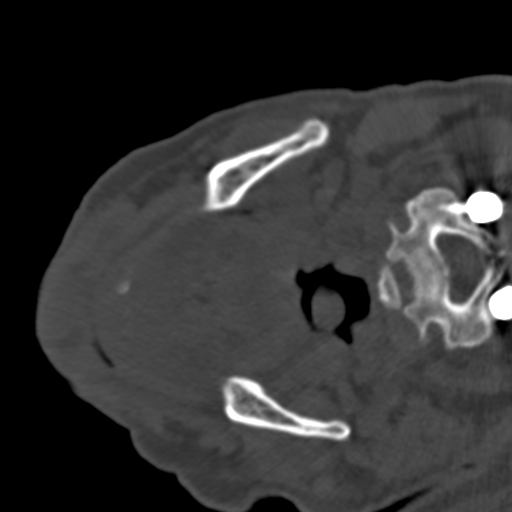
[im 36/66  bone]
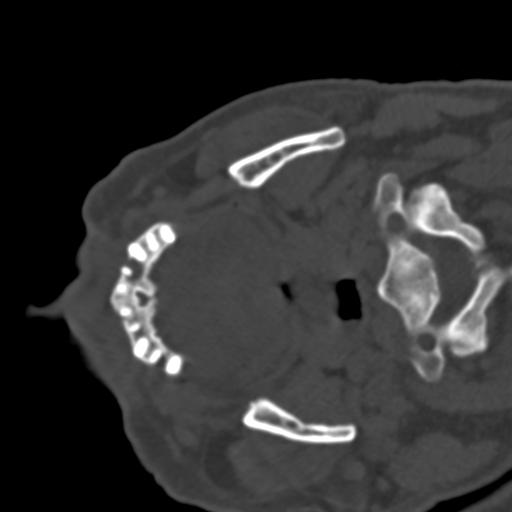
[im 43/66  bone]
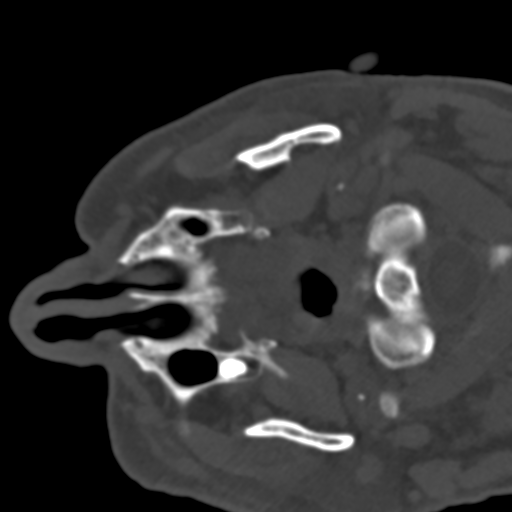
[im 50/66  bone]
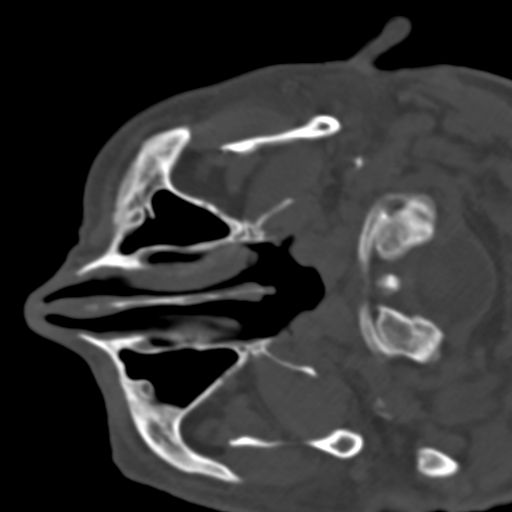
[im 54/66  brain]
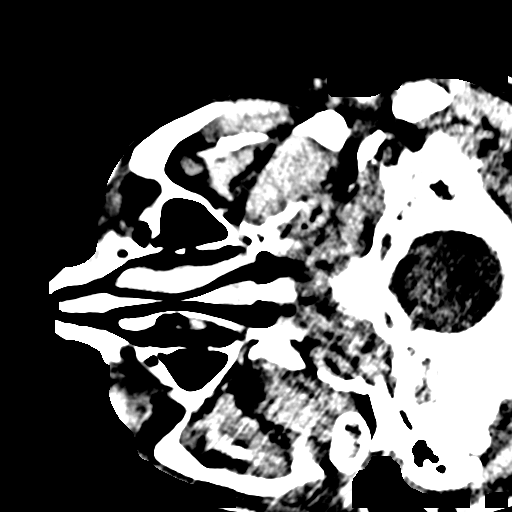
[im 54/66  bone]
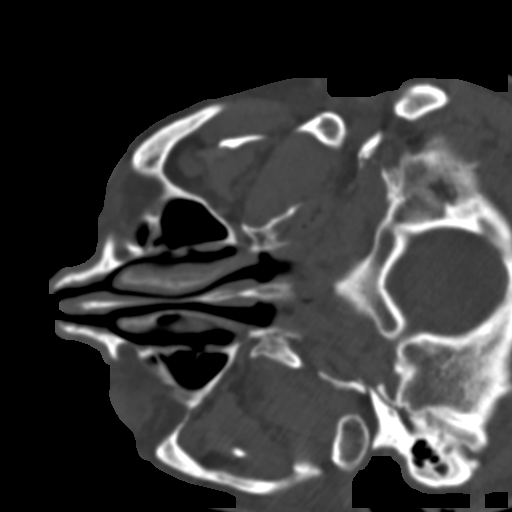
[im 61/66  bone]
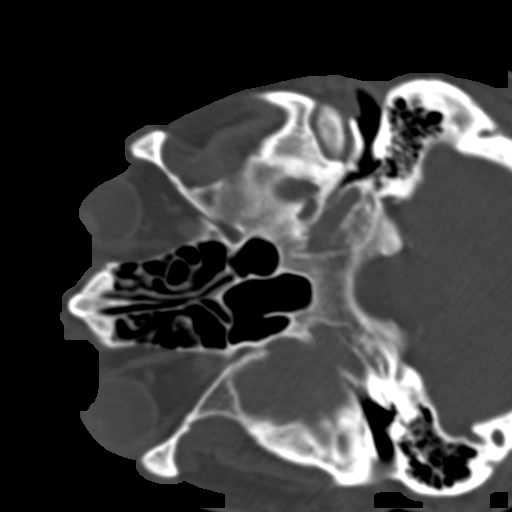

[Series 10: coronal soft · coronal · 0.30mm/px · 3 of 75 slices shown]
[im 25/75  bone]
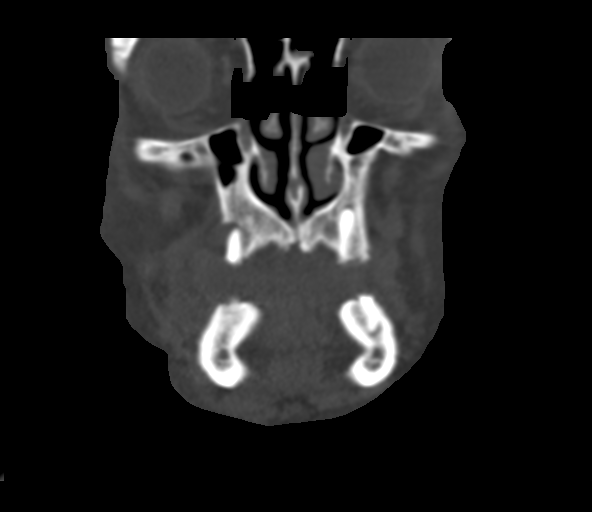
[im 33/75  bone]
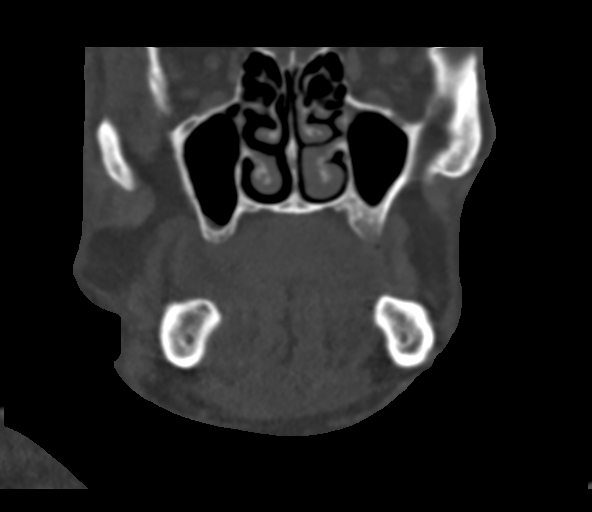
[im 42/75  bone]
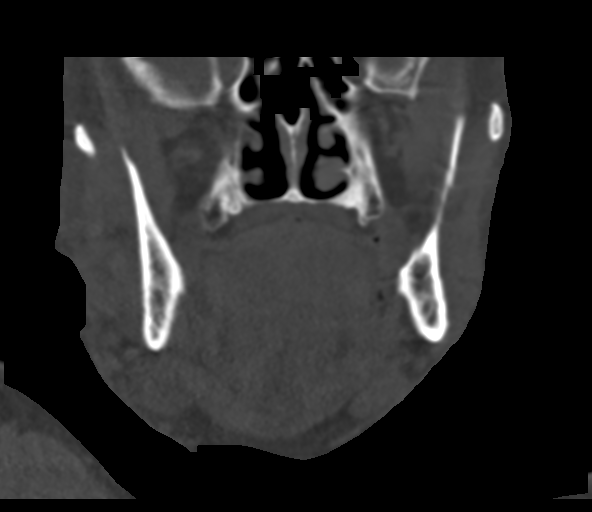

[Series 11: sagittal soft · sagittal · 0.31mm/px · 3 of 78 slices shown]
[im 26/78  bone]
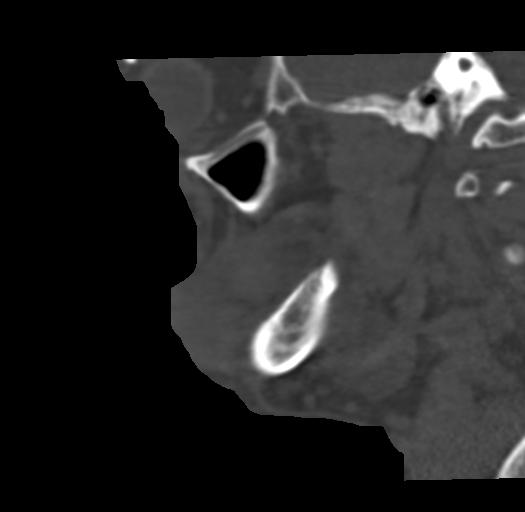
[im 39/78  bone]
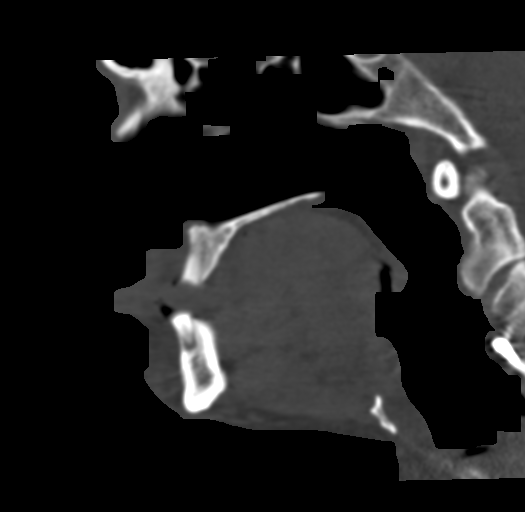
[im 52/78  bone]
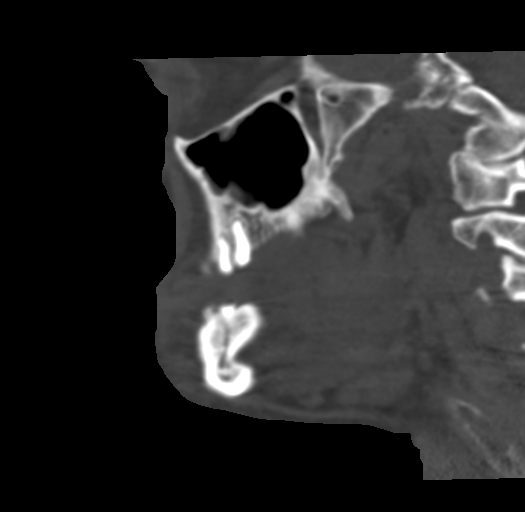

[16 of 47 positions shown; findings below may reference images not displayed]

FINDINGS: Motion degraded examination. Technologist reported patient combative
during the exam.

CT HEAD FINDINGS

Brain: No evidence of acute infarction, hemorrhage, hydrocephalus,
extra-axial collection or mass lesion/mass effect.

Vascular: No hyperdense vessel or unexpected calcification.

Skull: Normal. Negative for fracture or focal lesion.

Other: None.

CT MAXILLOFACIAL FINDINGS

Osseous: No acute maxillofacial bone fracture. Bony orbital walls
are intact. Mandible intact. Temporomandibular joints are aligned
without dislocation. Multifocal dental periapical lucencies.

Orbits: Negative. No traumatic or inflammatory finding.

Sinuses: Clear.

Soft tissues: No soft tissue swelling or hematoma.
IMPRESSION: 1. This limitation there is motion degraded examination. Within no
evidence of an acute intracranial process.
2. No acute maxillofacial bone fracture.
3. Multifocal dental periapical lucencies.

## 2021-10-31 IMAGING — CT CT HEAD W/O CM
4 series · 16 of 47 positions shown, 18 images · non-contrast
Comparison: None.

CLINICAL DATA: Delirium

EXAM:
CT HEAD WITHOUT CONTRAST
CT MAXILLOFACIAL WITHOUT CONTRAST
TECHNIQUE: Multidetector CT imaging of the head and maxillofacial structures
were performed using the standard protocol without intravenous
contrast. Multiplanar CT image reconstructions of the maxillofacial
structures were also generated.

[Series 1: head w o · axial · 0.47mm/px · z∈[-70,+34]mm · 6 of 31 slices shown, 8 images]
[im 5/31  brain]
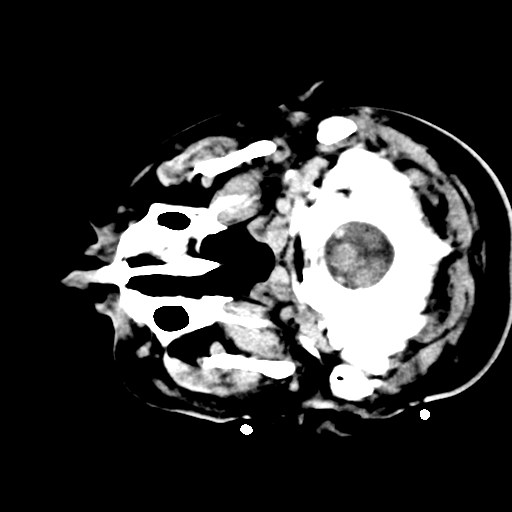
[im 5/31  bone]
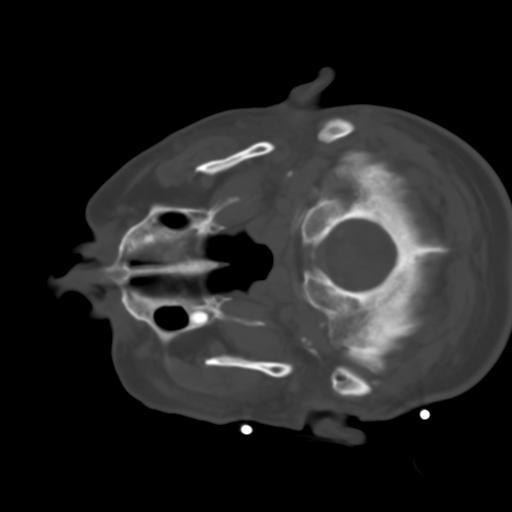
[im 9/31  brain]
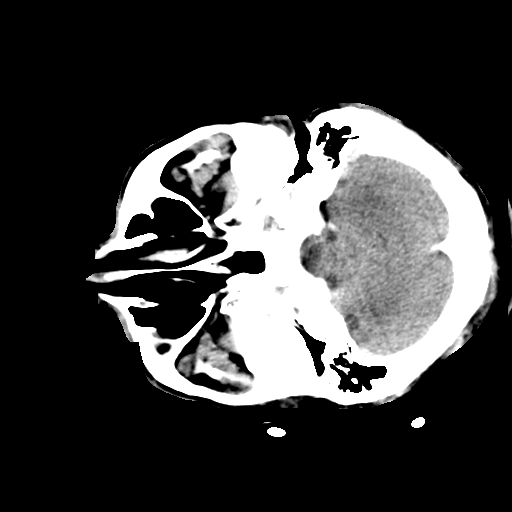
[im 13/31  brain]
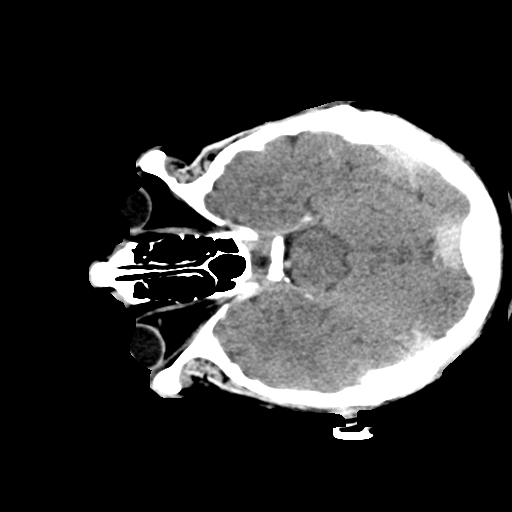
[im 18/31  brain]
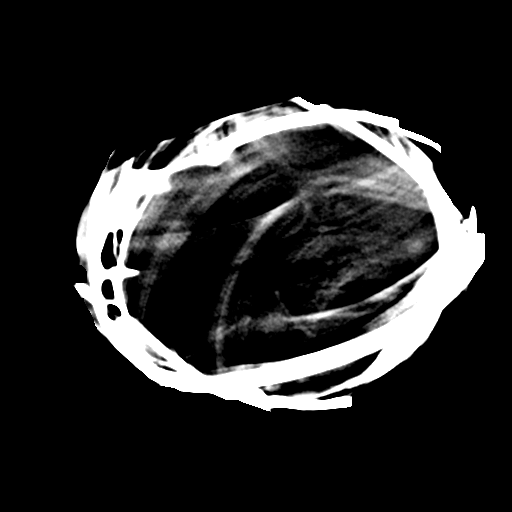
[im 22/31  brain]
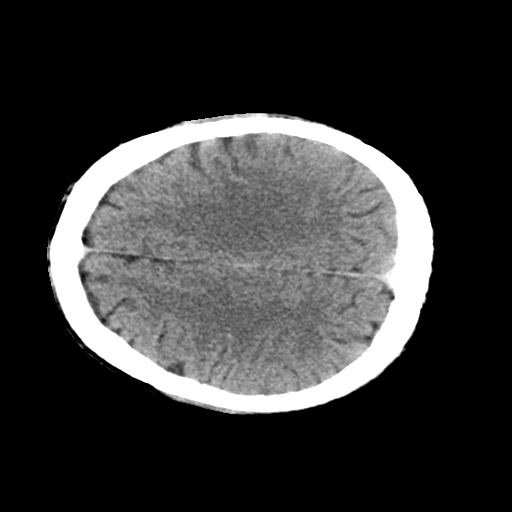
[im 22/31  bone]
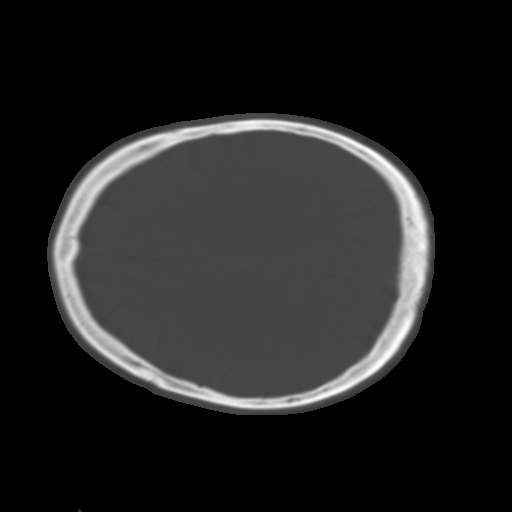
[im 26/31  brain]
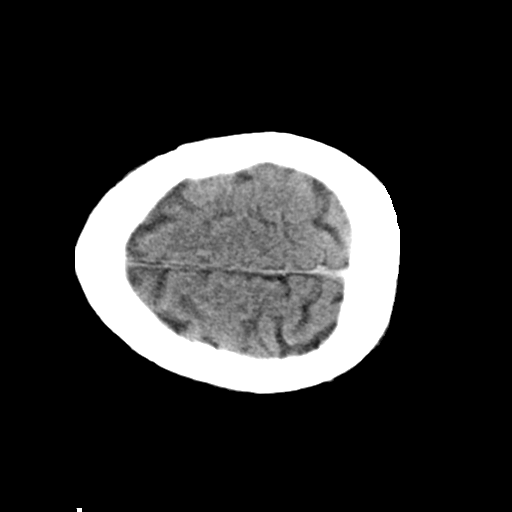

[Series 5: head bone · axial · 0.47mm/px · z∈[-66,-12]mm · 4 of 81 slices shown]
[im 8/81  bone]
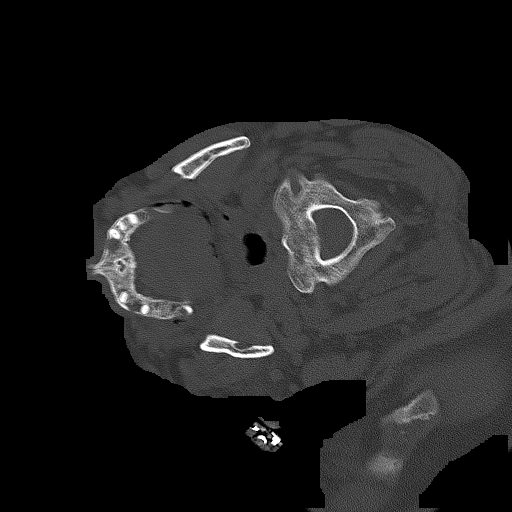
[im 16/81  bone]
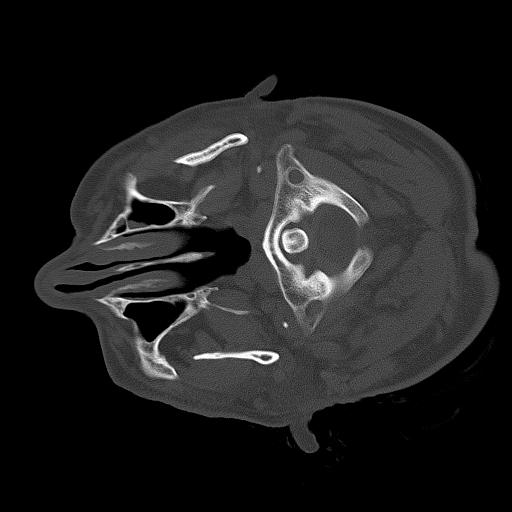
[im 27/81  bone]
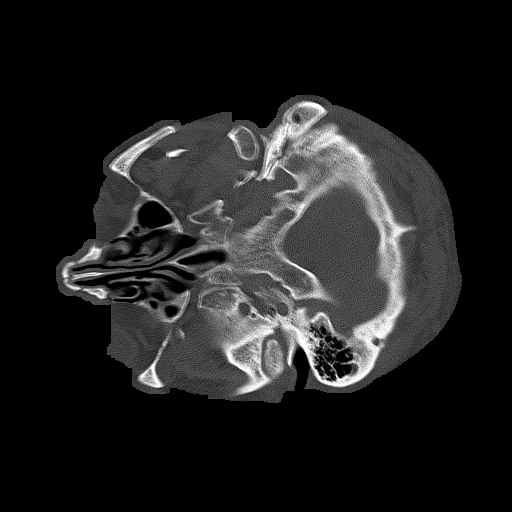
[im 35/81  bone]
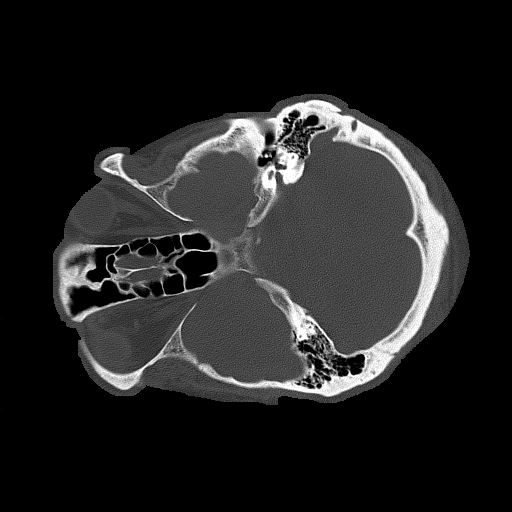

[Series 6: coronal soft · coronal · 0.33mm/px · 3 of 70 slices shown]
[im 24/70  brain]
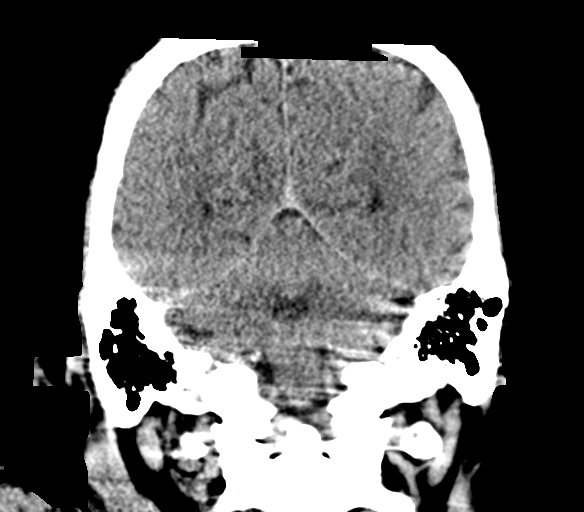
[im 31/70  brain]
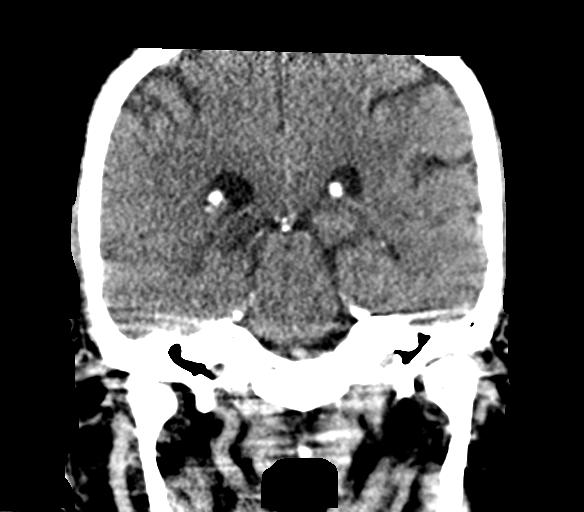
[im 39/70  brain]
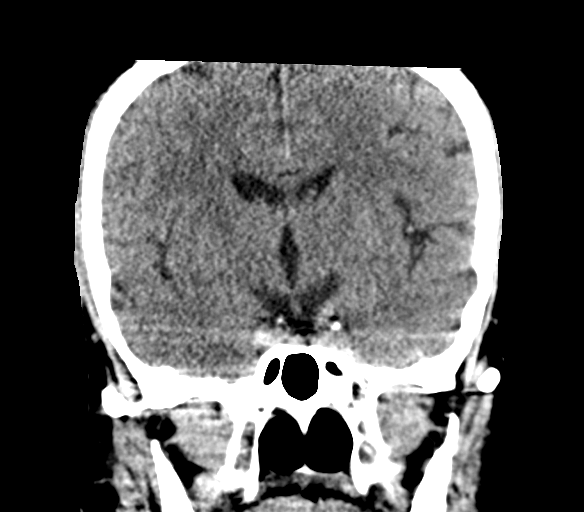

[Series 7: sagittal soft · sagittal · 0.33mm/px · 3 of 61 slices shown]
[im 21/61  brain]
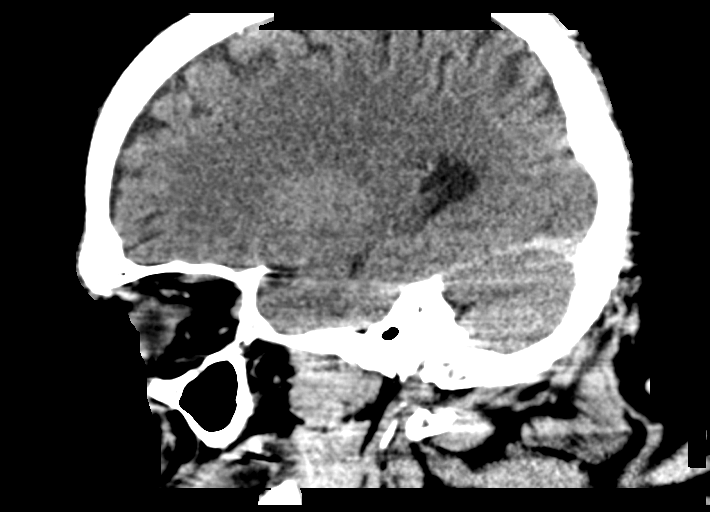
[im 31/61  brain]
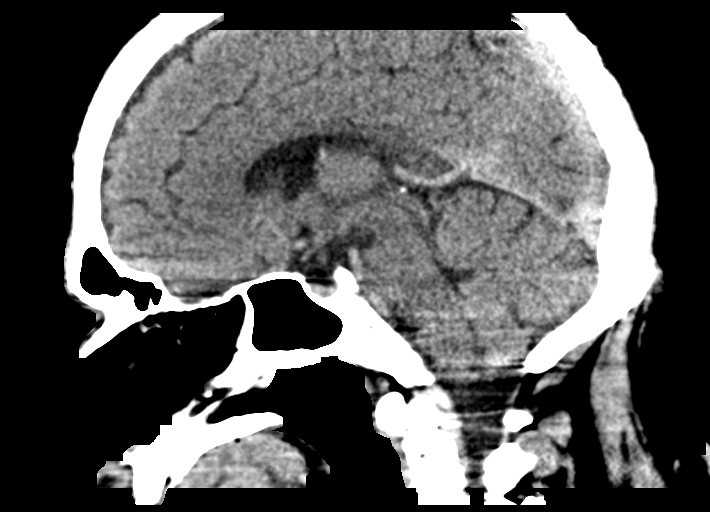
[im 41/61  brain]
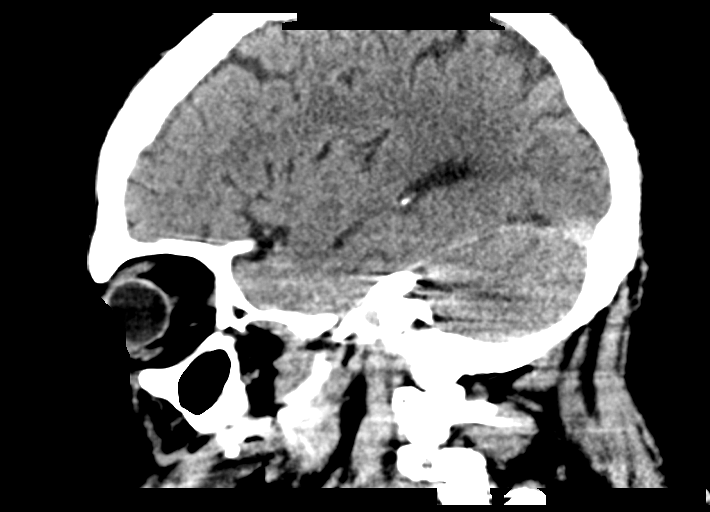

[16 of 47 positions shown; findings below may reference images not displayed]

FINDINGS: Motion degraded examination. Technologist reported patient combative
during the exam.

CT HEAD FINDINGS

Brain: No evidence of acute infarction, hemorrhage, hydrocephalus,
extra-axial collection or mass lesion/mass effect.

Vascular: No hyperdense vessel or unexpected calcification.

Skull: Normal. Negative for fracture or focal lesion.

Other: None.

CT MAXILLOFACIAL FINDINGS

Osseous: No acute maxillofacial bone fracture. Bony orbital walls
are intact. Mandible intact. Temporomandibular joints are aligned
without dislocation. Multifocal dental periapical lucencies.

Orbits: Negative. No traumatic or inflammatory finding.

Sinuses: Clear.

Soft tissues: No soft tissue swelling or hematoma.
IMPRESSION: 1. This limitation there is motion degraded examination. Within no
evidence of an acute intracranial process.
2. No acute maxillofacial bone fracture.
3. Multifocal dental periapical lucencies.

## 2021-11-13 ENCOUNTER — Ambulatory Visit (INDEPENDENT_AMBULATORY_CARE_PROVIDER_SITE_OTHER): Payer: Medicare HMO | Admitting: Family Medicine

## 2021-11-13 ENCOUNTER — Ambulatory Visit (HOSPITAL_COMMUNITY): Payer: Medicare HMO

## 2021-11-13 VITALS — BP 108/60 | HR 64 | Temp 97.9°F | Ht 70.0 in | Wt 180.0 lb

## 2021-11-13 DIAGNOSIS — G894 Chronic pain syndrome: Secondary | ICD-10-CM

## 2021-11-13 DIAGNOSIS — R55 Syncope and collapse: Secondary | ICD-10-CM | POA: Diagnosis not present

## 2021-11-13 DIAGNOSIS — G43909 Migraine, unspecified, not intractable, without status migrainosus: Secondary | ICD-10-CM | POA: Insufficient documentation

## 2021-11-13 DIAGNOSIS — Z13220 Encounter for screening for lipoid disorders: Secondary | ICD-10-CM

## 2021-11-13 DIAGNOSIS — G43709 Chronic migraine without aura, not intractable, without status migrainosus: Secondary | ICD-10-CM

## 2021-11-13 MED ORDER — ALBUTEROL SULFATE HFA 108 (90 BASE) MCG/ACT IN AERS
2.0000 | INHALATION_SPRAY | Freq: Four times a day (QID) | RESPIRATORY_TRACT | 3 refills | Status: DC | PRN
Start: 2021-11-13 — End: 2022-02-26

## 2021-11-13 NOTE — Patient Instructions (Signed)
We will get the CT scheduled as well as the referral.  Labs today.  Follow up in 1 month.

## 2021-11-14 DIAGNOSIS — R55 Syncope and collapse: Secondary | ICD-10-CM | POA: Insufficient documentation

## 2021-11-14 DIAGNOSIS — E785 Hyperlipidemia, unspecified: Secondary | ICD-10-CM | POA: Insufficient documentation

## 2021-11-14 DIAGNOSIS — K219 Gastro-esophageal reflux disease without esophagitis: Secondary | ICD-10-CM | POA: Insufficient documentation

## 2021-11-14 NOTE — Progress Notes (Signed)
Subjective:  Patient ID: Jared Li, male    DOB: 1966-03-15  Age: 56 y.o. MRN: [id number]  CC: Chief Complaint  Patient presents with   Establish Care    Syncopy w loss of conciousnes x 2 in 2 weeks injured right eye  Feels increase numbness  and tingling causing headaches  Hx of 23 back and neck surgeries, blurry vision - believes had concussion    HPI:  56 year old male with chronic pain, opiate dependence, migraine headache, hypogonadism, GERD, HLD presents to establish care.  Patient reports that he has had 2 unwitnessed syncopal episodes in the past 2 weeks.  Patient states that his syncopal episodes were preceded by headache and associated nausea.  Has a history of migraine headache.  States that the headache is different from his normal migraines.  Both syncopal episodes were unwitnessed.  He was found down the second time by his neighbor covered in blood.  He did not seek emergency care.  He has a longstanding history of chronic pain and is on multiple controlled medications including lorazepam, oxycodone, dronabinol.  Patient states that it takes him some time before he feels back to normal.  However, he states that this does not seem to be a postictal state.  He reports that he recently has been weaning down off of his pain medication managed by his pain medicine physician.  No reports of palpitations or preceding chest pain.  Patient Active Problem List   Diagnosis Date Noted   GERD (gastroesophageal reflux disease) 11/14/2021   HLD (hyperlipidemia) 11/14/2021   Syncope 11/14/2021   Chronic pain syndrome 11/13/2021   Migraine headache 11/13/2021   Opiate dependence ([place]) 12/06/2020   Hypogonadism male 01/18/2018    Social Hx   Social History   Socioeconomic History   Marital status: Divorced    Spouse name: Not on file   Number of children: Not on file   Years of education: Not on file   Highest education level: Not on file  Occupational History   Not on  file  Tobacco Use   Smoking status: Former    Years: 35.00    Types: Cigarettes    Quit date: 06/02/2016    Years since quitting: 5.4   Smokeless tobacco: Former   Tobacco comments:    used chewing tabacco as a child  Substance and Sexual Activity   Alcohol use: No    Alcohol/week: 0.0 standard drinks of alcohol    Comment: occasional   Drug use: No   Sexual activity: Not on file  Other Topics Concern   Not on file  Social History Narrative   Not on file   Social Determinants of Health   Financial Resource Strain: Not on file  Food Insecurity: Not on file  Transportation Needs: Not on file  Physical Activity: Not on file  Stress: Not on file  Social Connections: Not on file    Review of Systems Per HPI  Objective:  BP 108/60   Pulse 64   Temp 97.9 F (36.6 C)   Ht '5\' 10"'  (1.778 m)   Wt 180 lb (81.6 kg)   SpO2 97%   BMI 25.83 kg/m      11/13/2021    2:27 PM 12/12/2020    1:13 PM 12/12/2020    6:34 AM  BP/Weight  Systolic BP 440 347 425  Diastolic BP 60 96 956  Wt. (Lbs) 180    BMI 25.83 kg/m2      Physical Exam  Vitals and nursing note reviewed.  Constitutional:      Comments: No acute distress.  HENT:     Head: Normocephalic and atraumatic.     Nose: Nose normal.     Mouth/Throat:     Pharynx: Oropharynx is clear.  Eyes:     General:        Right eye: No discharge.        Left eye: No discharge.     Conjunctiva/sclera: Conjunctivae normal.     Pupils: Pupils are equal, round, and reactive to light.  Cardiovascular:     Rate and Rhythm: Normal rate and regular rhythm.  Pulmonary:     Effort: Pulmonary effort is normal.     Breath sounds: Normal breath sounds. No wheezing or rales.  Abdominal:     General: There is no distension.     Palpations: Abdomen is soft.     Tenderness: There is no abdominal tenderness.  Neurological:     General: No focal deficit present.     Mental Status: He is alert.  Psychiatric:     Comments: Flat affect.       Lab Results  Component Value Date   WBC 7.5 12/09/2020   HGB 14.5 12/09/2020   HCT 44.1 12/09/2020   PLT 176 12/09/2020   GLUCOSE 88 12/11/2020   ALT 17 12/06/2020   AST 34 12/06/2020   NA 138 12/11/2020   K 3.5 12/11/2020   CL 105 12/11/2020   CREATININE 0.89 12/11/2020   BUN 5 (L) 12/11/2020   CO2 28 12/11/2020   TSH 2.142 12/06/2020   INR 1.3 (H) 12/06/2020     Assessment & Plan:   Problem List Items Addressed This Visit       Other   Chronic pain syndrome   Syncope - Primary    Unclear etiology.  His preceding headache is concerning in the setting of syncope and also given his age.  Needs laboratory studies and CT for further evaluation.  Pending CT will likely need cardiology referral as well.      Relevant Orders   CT HEAD WO CONTRAST (5MM)   CBC   CMP14+EGFR   TSH   Ambulatory referral to Cardiology   Other Visit Diagnoses     Screening, lipid       Relevant Orders   Lipid panel       Meds ordered this encounter  Medications   albuterol (VENTOLIN HFA) 108 (90 Base) MCG/ACT inhaler    Sig: Inhale 2 puffs into the lungs every 6 (six) hours as needed for wheezing or shortness of breath.    Dispense:  18 g    Refill:  3    Follow-up:  Return in about 1 month (around 12/14/2021).  [place]

## 2021-11-14 NOTE — Assessment & Plan Note (Addendum)
Unclear etiology.  His preceding headache is concerning in the setting of syncope and also given his age.  Needs laboratory studies and CT for further evaluation.  Pending CT will likely need cardiology referral as well.

## 2021-11-15 ENCOUNTER — Ambulatory Visit (HOSPITAL_COMMUNITY): Admission: RE | Admit: 2021-11-15 | Payer: Medicare HMO | Source: Ambulatory Visit

## 2021-11-16 ENCOUNTER — Ambulatory Visit (HOSPITAL_COMMUNITY)
Admission: RE | Admit: 2021-11-16 | Discharge: 2021-11-16 | Disposition: A | Discharge status: Home / Self Care | Payer: Medicare HMO | Source: Ambulatory Visit | Attending: Family Medicine | Admitting: Family Medicine

## 2021-11-16 DIAGNOSIS — R519 Headache, unspecified: Secondary | ICD-10-CM | POA: Diagnosis not present

## 2021-11-16 DIAGNOSIS — R55 Syncope and collapse: Secondary | ICD-10-CM | POA: Diagnosis not present

## 2021-11-26 ENCOUNTER — Telehealth: Payer: Self-pay

## 2021-11-26 ENCOUNTER — Other Ambulatory Visit: Payer: Self-pay | Admitting: Family Medicine

## 2021-11-26 DIAGNOSIS — E291 Testicular hypofunction: Secondary | ICD-10-CM

## 2021-11-26 DIAGNOSIS — Z13220 Encounter for screening for lipoid disorders: Secondary | ICD-10-CM | POA: Diagnosis not present

## 2021-11-26 DIAGNOSIS — R55 Syncope and collapse: Secondary | ICD-10-CM | POA: Diagnosis not present

## 2021-11-26 NOTE — Telephone Encounter (Signed)
Patient notified and verbalized understanding. 

## 2021-11-26 NOTE — Telephone Encounter (Signed)
Tommie Sams, DO     Test ordered.  Must be done first thing in the Am.

## 2021-11-26 NOTE — Telephone Encounter (Signed)
Caller name:Jadier Rohman   On DPR? :Yes  Call back number:[phone]  Provider they see: Adriana Simas  Reason for call:Pt come by our office and said that he wanted testosterone test ordered that he and Dr Adriana Simas dicussed in his last appt.

## 2021-11-27 LAB — LIPID PANEL
Chol/HDL Ratio: 5.4 ratio — ABNORMAL HIGH (ref 0.0–5.0)
Cholesterol, Total: 163 mg/dL (ref 100–199)
HDL: 30 mg/dL — ABNORMAL LOW (ref >39)
LDL Chol Calc (NIH): 102 mg/dL — ABNORMAL HIGH (ref 0–99)
Triglycerides: 179 mg/dL — ABNORMAL HIGH (ref 0–149)
VLDL Cholesterol Cal: 31 mg/dL (ref 5–40)

## 2021-11-27 LAB — CMP14+EGFR
ALT: 11 IU/L (ref 0–44)
AST: 13 IU/L (ref 0–40)
Albumin/Globulin Ratio: 1.8 (ref 1.2–2.2)
Albumin: 4.2 g/dL (ref 3.8–4.9)
Alkaline Phosphatase: 143 IU/L — ABNORMAL HIGH (ref 44–121)
BUN/Creatinine Ratio: 5 — ABNORMAL LOW (ref 9–20)
BUN: 7 mg/dL (ref 6–24)
Bilirubin Total: 0.3 mg/dL (ref 0.0–1.2)
CO2: 26 mmol/L (ref 20–29)
Calcium: 9.1 mg/dL (ref 8.7–10.2)
Chloride: 99 mmol/L (ref 96–106)
Creatinine, Ser: 1.41 mg/dL — ABNORMAL HIGH (ref 0.76–1.27)
Globulin, Total: 2.4 g/dL (ref 1.5–4.5)
Glucose: 92 mg/dL (ref 70–99)
Potassium: 5.3 mmol/L — ABNORMAL HIGH (ref 3.5–5.2)
Sodium: 140 mmol/L (ref 134–144)
Total Protein: 6.6 g/dL (ref 6.0–8.5)
eGFR: 58 mL/min/{1.73_m2} — ABNORMAL LOW (ref >59)

## 2021-11-27 LAB — TSH: TSH: 3.02 u[IU]/mL (ref 0.450–4.500)

## 2021-11-27 LAB — CBC
Hematocrit: 47.6 % (ref 37.5–51.0)
Hemoglobin: 16.2 g/dL (ref 13.0–17.7)
MCH: 33.3 pg — ABNORMAL HIGH (ref 26.6–33.0)
MCHC: 34 g/dL (ref 31.5–35.7)
MCV: 98 fL — ABNORMAL HIGH (ref 79–97)
Platelets: 169 10*3/uL (ref 150–450)
RBC: 4.87 x10E6/uL (ref 4.14–5.80)
RDW: 11.8 % (ref 11.6–15.4)
WBC: 6.1 10*3/uL (ref 3.4–10.8)

## 2021-11-28 ENCOUNTER — Other Ambulatory Visit: Payer: Self-pay | Admitting: Family Medicine

## 2021-11-28 ENCOUNTER — Other Ambulatory Visit: Payer: Self-pay

## 2021-11-28 MED ORDER — ATORVASTATIN CALCIUM 40 MG PO TABS: 40.0000 mg | ORAL_TABLET | Freq: Every day | ORAL | 3 refills | Status: DC

## 2021-11-29 ENCOUNTER — Other Ambulatory Visit: Payer: Self-pay

## 2021-11-29 DIAGNOSIS — E875 Hyperkalemia: Secondary | ICD-10-CM

## 2021-12-05 ENCOUNTER — Emergency Department (HOSPITAL_COMMUNITY)
Admission: EM | Admit: 2021-12-05 | Discharge: 2021-12-05 | Disposition: A | Discharge status: Home / Self Care | Payer: Medicare HMO | Attending: Emergency Medicine | Admitting: Emergency Medicine

## 2021-12-05 ENCOUNTER — Encounter (HOSPITAL_COMMUNITY): Payer: Self-pay | Admitting: *Deleted

## 2021-12-05 ENCOUNTER — Other Ambulatory Visit: Payer: Self-pay

## 2021-12-05 ENCOUNTER — Emergency Department (HOSPITAL_COMMUNITY): Payer: Medicare HMO

## 2021-12-05 ENCOUNTER — Ambulatory Visit: Payer: Medicare HMO | Attending: Medical | Admitting: Medical

## 2021-12-05 ENCOUNTER — Ambulatory Visit (INDEPENDENT_AMBULATORY_CARE_PROVIDER_SITE_OTHER): Payer: Medicare HMO

## 2021-12-05 ENCOUNTER — Encounter: Payer: Self-pay | Admitting: Medical

## 2021-12-05 VITALS — BP 79/54 | HR 60 | Ht 70.0 in | Wt 186.6 lb

## 2021-12-05 DIAGNOSIS — I10 Essential (primary) hypertension: Secondary | ICD-10-CM | POA: Diagnosis not present

## 2021-12-05 DIAGNOSIS — R531 Weakness: Secondary | ICD-10-CM | POA: Diagnosis not present

## 2021-12-05 DIAGNOSIS — R55 Syncope and collapse: Secondary | ICD-10-CM | POA: Diagnosis not present

## 2021-12-05 DIAGNOSIS — I959 Hypotension, unspecified: Secondary | ICD-10-CM

## 2021-12-05 DIAGNOSIS — E860 Dehydration: Secondary | ICD-10-CM | POA: Diagnosis not present

## 2021-12-05 LAB — COMPREHENSIVE METABOLIC PANEL
ALT: 16 U/L (ref 0–44)
AST: 18 U/L (ref 15–41)
Albumin: 4.2 g/dL (ref 3.5–5.0)
Alkaline Phosphatase: 123 U/L (ref 38–126)
Anion gap: 8 (ref 5–15)
BUN: 15 mg/dL (ref 6–20)
CO2: 29 mmol/L (ref 22–32)
Calcium: 9.1 mg/dL (ref 8.9–10.3)
Chloride: 103 mmol/L (ref 98–111)
Creatinine, Ser: 1.39 mg/dL — ABNORMAL HIGH (ref 0.61–1.24)
GFR, Estimated: 59 mL/min — ABNORMAL LOW (ref >60)
Glucose, Bld: 86 mg/dL (ref 70–99)
Potassium: 4.5 mmol/L (ref 3.5–5.1)
Sodium: 140 mmol/L (ref 135–145)
Total Bilirubin: 0.6 mg/dL (ref 0.3–1.2)
Total Protein: 7.6 g/dL (ref 6.5–8.1)

## 2021-12-05 LAB — CBC WITH DIFFERENTIAL/PLATELET
Abs Immature Granulocytes: 0.01 10*3/uL (ref 0.00–0.07)
Basophils Absolute: 0.1 10*3/uL (ref 0.0–0.1)
Basophils Relative: 1 %
Eosinophils Absolute: 0.3 10*3/uL (ref 0.0–0.5)
Eosinophils Relative: 4 %
HCT: 46.1 % (ref 39.0–52.0)
Hemoglobin: 15.5 g/dL (ref 13.0–17.0)
Immature Granulocytes: 0 %
Lymphocytes Relative: 36 %
Lymphs Abs: 2.9 10*3/uL (ref 0.7–4.0)
MCH: 33.5 pg (ref 26.0–34.0)
MCHC: 33.6 g/dL (ref 30.0–36.0)
MCV: 99.6 fL (ref 80.0–100.0)
Monocytes Absolute: 0.7 10*3/uL (ref 0.1–1.0)
Monocytes Relative: 9 %
Neutro Abs: 4.1 10*3/uL (ref 1.7–7.7)
Neutrophils Relative %: 50 %
Platelets: 187 10*3/uL (ref 150–400)
RBC: 4.63 MIL/uL (ref 4.22–5.81)
RDW: 12.5 % (ref 11.5–15.5)
WBC: 8.1 10*3/uL (ref 4.0–10.5)
nRBC: 0 % (ref 0.0–0.2)

## 2021-12-05 LAB — LACTIC ACID, PLASMA: Lactic Acid, Venous: 0.8 mmol/L (ref 0.5–1.9)

## 2021-12-05 MED ORDER — CLONIDINE HCL 0.1 MG PO TABS: ORAL_TABLET | ORAL | 0 refills | Status: DC

## 2021-12-05 MED ORDER — SODIUM CHLORIDE 0.9 % IV BOLUS
1000.0000 mL | Freq: Once | INTRAVENOUS | Status: AC
  Administered 2021-12-05: 1000 mL via INTRAVENOUS

## 2021-12-05 NOTE — ED Triage Notes (Signed)
Pt sent here from [place] office for low blood pressure.  Noted BP was 79/54.  Pt with generalized weakness and dizziness for " awhile".  Pt seen at [place] office for syncopal episode. C/o chronic back pain.

## 2021-12-05 NOTE — Discharge Instructions (Signed)
Drink plenty of fluids and do not take your blood pressure medicine anymore.  Make sure you follow-up next week as scheduled with your doctor.  Return if problems

## 2021-12-05 NOTE — ED Notes (Signed)
Pt alert, NAD, calm, interactive, resps e/u, speaking in clear complete sentences, xray at [facility] complete, EKG complete. VSS.

## 2021-12-05 NOTE — Patient Instructions (Signed)
Medication Instructions:   Your physician has recommended you make the following change in your medication:    DECREASE your Clonidine to 0.1 MG:  Take 1 tab by mouth twice a day for 3 days and then take 1 tab by mouth once a day for 3 days then stop.   *If you need a refill on your cardiac medications before your next appointment, please call your pharmacy*     Testing/Procedures:   Your physician has requested that you have an echocardiogram in our [place] office. Echocardiography is a painless test that uses sound waves to create images of your heart. It provides your doctor with information about the size and shape of your heart and how well your heart's chambers and valves are working. This procedure takes approximately one hour. There are no restrictions for this procedure.  2.   Your physician has recommended that you wear a Zio XT monitor for 2 weeks. This will be mailed to your home address in 4-5 business days.   Your clinician has requested a Zio heart rhythm monitor by [employer] to be mailed to your home for you to wear for 14 days. You should expect a small box to arrive via USPS (or FedEx in some cases) within this next week. If you do not receive it please call [employer] at [phone].  Closely watching your heart at this time will help your care team understand more and provide information needed to develop your plan of care.  Please apply your Zio patch monitor the day you receive it. Keep this packaging, you will use this to return your Zio monitor.  You will easily be able to apply the monitor with the instructions provided in the Patient Guide.  If you need assistance, [employer] representatives are available 24/7 at [phone].  You can also download the [facility] app on your phone to view detailed application instructions and log symptoms.  After you wear your monitor for 14 days, place it back in the blue box or envelope, along with your Symptom Log.  To send  your monitor back: Simply use the pre-addressed and pre-paid box/envelope.  Send it back through [employer] the same day you remove it via your local post office or by placing it in your mailbox.  As soon as we receive the results, they will be reviewed and your clinician will contact you.  For the first 24 hours- it is essential to not shower or exercise, to allow the patch to adhere to your skin. Avoid excessive sweating to help maximize wear time. Do not submerge the device, no hot tubs, and no swimming pools. Keep any lotions or oils away from the patch. After 24 hours you may shower with the patch on. Take brief showers with your back facing the shower head.  Do not remove patch once it has been placed because that will interrupt data and decrease adhesive wear time. Push the button when you have any symptoms and write down what you were feeling. Once you have completed wearing your monitor, remove and place into box which has postage paid and place in your outgoing mailbox.  If for some reason you have misplaced your box then call our office and we can provide another box and/or mail it off for you.   Follow-Up: At [facility], you and your health needs are our priority.  As part of our continuing mission to provide you with exceptional heart care, we have created designated Provider Care Teams.  These  Care Teams include your primary Cardiologist (physician) and Advanced Practice Providers (APPs -  Physician Assistants and Nurse Practitioners) who all work together to provide you with the care you need, when you need it.  We recommend signing up for the patient portal called "MyChart".  Sign up information is provided on this After Visit Summary.  MyChart is used to connect with patients for Virtual Visits (Telemedicine).  Patients are able to view lab/test results, encounter notes, upcoming appointments, etc.  Non-urgent messages can be sent to your provider as well.   To learn  more about what you can do with MyChart, go to [email or web address].    Your next appointment:   6 week(s)  The format for your next appointment:   In Person  Provider:   You may see  or one of the following Advanced Practice Providers on your designated Care Team:   [place], PA-C  Jacolyn Reedy, [place]     Important Information About Sugar

## 2021-12-05 NOTE — Progress Notes (Signed)
Cardiology Office Note:    Date:  12/05/2021   ID:  Jared Li, DOB 1965/04/06, MRN [id number]  PCP:  Coral Spikes, DO  [facility] Cardiologist:  None  [facility] Electrophysiologist:  None   Referring MD: Coral Spikes, DO   Chief Complaint: syncope  History of Present Illness:    Jared Li is a 56 y.o. male with a hx of GERD, cauda equina syndrome, chronic pain, current smoker 40+ years who presents for syncope.   Last seen 10/2020 for chest pressure. Echo and Cardiac CTA were ordered.   Today, the patient reports syncope that started a couple weeks ago. He smokes cigarettes daily and marijuana 2-3 times a week. Syncope he feels body goes numb, dizziness and lightheaded. It occurs when he is standing. The last time he passed out was 3 weeks ago. Still feels dizzy and lightheadedness. No chest pain, SOB, or palpitations. The patient did not go to the ER due to past bad experiences.   Past Medical History:  Diagnosis Date   Adjustment disorder    Arthritis    Asthma    Cauda equina syndrome (HCC)    Cellulitis of lower limb    Cervical radiculopathy    Chronic intractable headache    Mirgraines- lst one 06/02/16   Chronic pain    Edema    GERD (gastroesophageal reflux disease)    History of kidney stones    passed   Insomnia    Loose stools    Lumbar post-laminectomy syndrome    MRSA bacteremia    Open wound of lumbar region with complication    Physical deconditioning    Tobacco use disorder     Past Surgical History:  Procedure Laterality Date   APPLICATION OF A-CELL OF HEAD/NECK N/A 06/13/2016   Procedure: neck closure AFTER SPINE SURGERY WITH placement of wound vac.;  Surgeon: Loel Lofty Dillingham, DO;  Location: [facility];  Service: Plastics;  Laterality: N/A;   CARPAL TUNNEL RELEASE Bilateral    CERVICAL FUSION     Numerous   LUMBAR FUSION     Numerous   Pain Pump insertion     x 2   POSTERIOR CERVICAL FUSION/FORAMINOTOMY N/A 06/13/2016    Procedure: POSTERIOR CERVICAL DECOMPRESSION FUSION, CERVICAL 3-4, CERVICAL 4-5, CERVICAL 5-6, CERVICAL 6-7 WITH INSTRUMENTATION AND ALLOGRAFT;  Surgeon: Phylliss Bob, MD;  Location: [facility];  Service: Orthopedics;  Laterality: N/A;  POSTERIOR CERVICAL DECOMPRESSION FUSION, CERVICAL 3-4, CERVICAL 4-5, CERVICAL 5-6, CERVICAL 6-7 WITH INSTRUMENTATION AND ALLOGRAFT   POSTERIOR LAMINECTOMY / DECOMPRESSION LUMBAR SPINE     removal of pain pump     x 2   Spinal Card Stimulator Removal      x 2   SPINAL CORD STIMULATOR INSERTION      x2    Current Medications: Current Meds  Medication Sig   albuterol (VENTOLIN HFA) 108 (90 Base) MCG/ACT inhaler Inhale 2 puffs into the lungs every 6 (six) hours as needed for wheezing or shortness of breath.   atorvastatin (LIPITOR) 40 MG tablet Take 1 tablet (40 mg total) by mouth daily.   Cholecalciferol (VITAMIN D3) 50 MCG (2000 UT) capsule    cloNIDine (CATAPRES) 0.1 MG tablet Take 1 tab by mouth twice a day for 3 days and then take 1 tab by mouth once a day for 3 days then stop   diphenhydrAMINE (BENADRYL) 25 MG tablet Take 25 mg by mouth every 6 (six) hours as needed for itching.  dronabinol (MARINOL) 5 MG capsule Take one capsule by mouth twice daily before meals   GRALISE 600 MG TABS Take 3,000 mg by mouth daily with supper. Takes 5 tablets at dinner   LORazepam (ATIVAN) 0.5 MG tablet Take 0.5 mg by mouth 2 (two) times daily as needed.   Multiple Vitamin (MULTIVITAMIN WITH MINERALS) TABS tablet Take 1 tablet by mouth daily.   Nutritional Supplements (ENSURE HIGH PROTEIN PO) Take 1 Can by mouth 4 (four) times daily.   Omega-3 Fatty Acids (FISH OIL) 500 MG CAPS    omeprazole (PRILOSEC OTC) 20 MG tablet Take 20 mg by mouth daily as needed.    ondansetron (ZOFRAN) 4 MG tablet Take 4 mg by mouth every 8 (eight) hours as needed for nausea or vomiting.   oxyCODONE (ROXICODONE) 15 MG immediate release tablet Take 15 mg by mouth every 4 (four) hours as needed for  pain.   rizatriptan (MAXALT) 10 MG tablet Take 10 mg by mouth every 2 (two) hours as needed for migraine. May repeat in 2 hours if needed    sertraline (ZOLOFT) 100 MG tablet Take 100 mg by mouth at bedtime. Takes 2 tablets   Simethicone (GAS-X PO) Take 2 tablets by mouth 2 (two) times daily as needed (gas).   Testosterone 12.5 MG/ACT (1%) GEL SMARTSIG:4 pump Topical Daily   vitamin C (ASCORBIC ACID) 500 MG tablet Take 500 mg by mouth 2 (two) times daily.   zinc sulfate 220 (50 Zn) MG capsule Take 220 mg by mouth daily.   [DISCONTINUED] cloNIDine (CATAPRES) 0.2 MG tablet Take 1 tablet (0.2 mg total) by mouth 2 (two) times daily.     Allergies:   Adhesive [tape], Codeine, and Morphine and related   Social History   Socioeconomic History   Marital status: Divorced    Spouse name: Not on file   Number of children: Not on file   Years of education: Not on file   Highest education level: Not on file  Occupational History   Not on file  Tobacco Use   Smoking status: Former    Years: 35.00    Types: Cigarettes    Quit date: 06/02/2016    Years since quitting: 5.5   Smokeless tobacco: Former   Tobacco comments:    used chewing tabacco as a child  Substance and Sexual Activity   Alcohol use: No    Alcohol/week: 0.0 standard drinks of alcohol    Comment: occasional   Drug use: No   Sexual activity: Not on file  Other Topics Concern   Not on file  Social History Narrative   Not on file   Social Determinants of Health   Financial Resource Strain: Not on file  Food Insecurity: Not on file  Transportation Needs: Not on file  Physical Activity: Not on file  Stress: Not on file  Social Connections: Not on file     Family History: The patient's family history includes Alpha-1 antitrypsin deficiency in his father; Other in an other family member.  ROS:   Please see the history of present illness.     All other systems reviewed and are negative.  EKGs/Labs/Other Studies  Reviewed:    The following studies were reviewed today:  Cardiac CTA 10/2020 IMPRESSION: 1. Normal coronary calcium score of 0. Patient is low risk for coronary events.   2.  Normal coronary origin with right dominance.   3.  No evidence of CAD.   4.  CAD-RADS 0. Consider non-atherosclerotic causes  of chest pain.   Electronically Signed: By: Kate Sable M.D. On: 11/09/2020 17:53  EKG:  EKG is *** ordered today.  The ekg ordered today demonstrates ***  Recent Labs: 12/07/2020: B Natriuretic Peptide 265.0 12/11/2020: Magnesium 1.9 11/26/2021: ALT 11; BUN 7; Creatinine, Ser 1.41; Hemoglobin 16.2; Platelets 169; Potassium 5.3; Sodium 140; TSH 3.020  Recent Lipid Panel    Component Value Date/Time   CHOL 163 11/26/2021 1026   TRIG 179 (H) 11/26/2021 1026   HDL 30 (L) 11/26/2021 1026   CHOLHDL 5.4 (H) 11/26/2021 1026   LDLCALC 102 (H) 11/26/2021 1026     Risk Assessment/Calculations:   {Does this patient have ATRIAL FIBRILLATION?[phone]   Physical Exam:    VS:  BP (!) 79/54 (BP Location: Left Arm, Patient Position: Sitting, Cuff Size: Normal)   Pulse 60   Ht '5\' 10"'  (1.778 m)   Wt 186 lb 9.6 oz (84.6 kg)   SpO2 98%   BMI 26.77 kg/m     Wt Readings from Last 3 Encounters:  12/05/21 186 lb 9.6 oz (84.6 kg)  11/13/21 180 lb (81.6 kg)  12/06/20 168 lb 6.9 oz (76.4 kg)     GEN:  Well nourished, well developed in no acute distress HEENT: Normal NECK: No JVD; No carotid bruits LYMPHATICS: No lymphadenopathy CARDIAC: RRR, no murmurs, rubs, gallops RESPIRATORY:  Clear to auscultation without rales, wheezing or rhonchi  ABDOMEN: Soft, non-tender, non-distended MUSCULOSKELETAL:  No edema; No deformity  SKIN: Warm and dry NEUROLOGIC:  Alert and oriented x 3 PSYCHIATRIC:  Normal affect   ASSESSMENT:    1. Syncope and collapse   2. Hypotension, unspecified hypotension type    PLAN:    In order of problems listed above:  Syncope Hypotension  He has  been experiencing syncope for the last few weeks, last episode was 3 weeks ago. He has been feeling constantly dizzy. BP low, lowest today was 79/54. Unsure how long BP has been low for. Orthostatics negative. He is on clonidine 0.65m BID. We will wean this down 0.154mID x 3 days>0.59m559maily x3 days then stop. He will monitor BP at home. I will order an echo and 2 week heart monitor. I asked the patient why he didn't go the ER after the first syncope episode, he said is was from past bad experiences. I recommended ER evaluation, and patient said he was willing to go. [facility] [place]. He has a driver so will not be driving. He singed AMA and was taken out. He said [facility] Disposition: Follow up in 6 week(s) with MD/APP    Signed, Juddson Cobern H FNinfa MeekerA-C  12/05/2021 3:03 PM    [facility]

## 2021-12-05 NOTE — ED Provider Notes (Signed)
[facility] EMERGENCY DEPARTMENT Provider Note   CSN: [id number] Arrival date & time: 12/05/21  9485     History  Chief Complaint  Patient presents with   Hypotension    Alize Acy is a 56 y.o. male.  Patient complains of weakness and has had low blood pressure at the doctor's office..  Patient has a history of hypertension and also has bronchospasm  The history is provided by the patient and medical records. No language interpreter was used.  Weakness Severity:  Moderate Onset quality:  Sudden Timing:  Constant Progression:  Waxing and waning Chronicity:  New Relieved by:  Nothing Worsened by:  Nothing Ineffective treatments:  None tried Associated symptoms: no abdominal pain, no chest pain, no cough, no diarrhea, no frequency, no headaches and no seizures        Home Medications Prior to Admission medications   Medication Sig Start Date End Date Taking? Authorizing Provider  albuterol (VENTOLIN HFA) 108 (90 Base) MCG/ACT inhaler Inhale 2 puffs into the lungs every 6 (six) hours as needed for wheezing or shortness of breath. 11/13/21   Everlene Other G, DO  atorvastatin (LIPITOR) 40 MG tablet Take 1 tablet (40 mg total) by mouth daily. 11/28/21   Tommie Sams, DO  Cholecalciferol (VITAMIN D3) 50 MCG (2000 UT) capsule  11/28/21   Tommie Sams, DO  cloNIDine (CATAPRES) 0.1 MG tablet Take 1 tab by mouth twice a day for 3 days and then take 1 tab by mouth once a day for 3 days then stop 12/05/21   Furth, Cadence H, PA-C  diphenhydrAMINE (BENADRYL) 25 MG tablet Take 25 mg by mouth every 6 (six) hours as needed for itching.     [provider]  dronabinol (MARINOL) 5 MG capsule Take one capsule by mouth twice daily before meals 04/14/15   Montez Morita, Monica, DO  GRALISE 600 MG TABS Take 3,000 mg by mouth daily with supper. Takes 5 tablets at dinner 05/04/16   [provider]  imipramine (TOFRANIL) 10 MG tablet Take 10 mg by mouth at bedtime. Patient not taking:  Reported on 12/05/2021    [provider]  LORazepam (ATIVAN) 0.5 MG tablet Take 0.5 mg by mouth 2 (two) times daily as needed. 01/04/20   [provider]  Multiple Vitamin (MULTIVITAMIN WITH MINERALS) TABS tablet Take 1 tablet by mouth daily.    [provider]  Nutritional Supplements (ENSURE HIGH PROTEIN PO) Take 1 Can by mouth 4 (four) times daily.    [provider]  Omega-3 Fatty Acids (FISH OIL) 500 MG CAPS  11/28/21   Tommie Sams, DO  omeprazole (PRILOSEC OTC) 20 MG tablet Take 20 mg by mouth daily as needed.     [provider]  ondansetron (ZOFRAN) 4 MG tablet Take 4 mg by mouth every 8 (eight) hours as needed for nausea or vomiting.    [provider]  oxyCODONE (ROXICODONE) 15 MG immediate release tablet Take 15 mg by mouth every 4 (four) hours as needed for pain.    [provider]  rizatriptan (MAXALT) 10 MG tablet Take 10 mg by mouth every 2 (two) hours as needed for migraine. May repeat in 2 hours if needed     [provider]  sertraline (ZOLOFT) 100 MG tablet Take 100 mg by mouth at bedtime. Takes 2 tablets    [provider]  Simethicone (GAS-X PO) Take 2 tablets by mouth 2 (two) times daily as needed (gas).  [provider]  Testosterone 12.5 MG/ACT (1%) GEL SMARTSIG:4 pump Topical Daily 06/24/21   [provider]  vitamin C (ASCORBIC ACID) 500 MG tablet Take 500 mg by mouth 2 (two) times daily.    [provider]  zinc sulfate 220 (50 Zn) MG capsule Take 220 mg by mouth daily.    [provider]      Allergies    Adhesive [tape], Codeine, and Morphine and related    Review of Systems   Review of Systems  Constitutional:  Negative for appetite change and fatigue.  HENT:  Negative for congestion, ear discharge and sinus pressure.   Eyes:  Negative for discharge.  Respiratory:  Negative for cough.   Cardiovascular:  Negative for chest pain.   Gastrointestinal:  Negative for abdominal pain and diarrhea.  Genitourinary:  Negative for frequency and hematuria.  Musculoskeletal:  Negative for back pain.  Skin:  Negative for rash.  Neurological:  Positive for weakness. Negative for seizures and headaches.  Psychiatric/Behavioral:  Negative for hallucinations.     Physical Exam Updated Vital Signs BP 116/68   Pulse (!) 50   Temp 98 F (36.7 C) (Oral)   Resp 13   SpO2 93%  Physical Exam Vitals and nursing note reviewed.  Constitutional:      Appearance: He is well-developed.  HENT:     Head: Normocephalic.     Nose: Nose normal.  Eyes:     General: No scleral icterus.    Conjunctiva/sclera: Conjunctivae normal.  Neck:     Thyroid: No thyromegaly.  Cardiovascular:     Rate and Rhythm: Normal rate and regular rhythm.     Heart sounds: No murmur heard.    No friction rub. No gallop.  Pulmonary:     Breath sounds: No stridor. No wheezing or rales.  Chest:     Chest wall: No tenderness.  Abdominal:     General: There is no distension.     Tenderness: There is no abdominal tenderness. There is no rebound.  Musculoskeletal:        General: Normal range of motion.     Cervical back: Neck supple.  Lymphadenopathy:     Cervical: No cervical adenopathy.  Skin:    Findings: No erythema or rash.  Neurological:     Mental Status: He is alert and oriented to person, place, and time.     Motor: No abnormal muscle tone.     Coordination: Coordination normal.  Psychiatric:        Behavior: Behavior normal.     ED Results / Procedures / Treatments   Labs (all labs ordered are listed, but only abnormal results are displayed) Labs Reviewed  COMPREHENSIVE METABOLIC PANEL - Abnormal; Notable for the following components:      Result Value   Creatinine, Ser 1.39 (*)    GFR, Estimated 59 (*)    All other components within normal limits  CBC WITH DIFFERENTIAL/PLATELET  LACTIC ACID, PLASMA  LACTIC ACID, PLASMA     EKG None  Radiology DG Chest Port 1 View  Result Date: 12/05/2021 CLINICAL DATA:  Hypotension and weakness EXAM: PORTABLE CHEST 1 VIEW COMPARISON:  Chest radiograph dated 12/05/2020 FINDINGS: The heart size and mediastinal contours are within normal limits. Both lungs are clear. The visualized skeletal structures are unremarkable. IMPRESSION: No active disease. Electronically Signed   By: Lorenza Cambridge M.D.   On: 12/05/2021 16:44    Procedures Procedures    Medications Ordered in ED  Medications  sodium chloride 0.9 % bolus 1,000 mL (0 mLs Intravenous Stopped 12/05/21 1716)    ED Course/ Medical Decision Making/ A&P  Patient was hypotensive but responded well to saline.  He will be discharged home                         Medical Decision Making Amount and/or Complexity of Data Reviewed Labs: ordered. Radiology: ordered. ECG/medicine tests: ordered.  This patient presents to the ED for concern of weakness and hypotension, this involves an extensive number of treatment options, and is a complaint that carries with it a high risk of complications and morbidity.  The differential diagnosis includes sepsis, dehydration   Co morbidities that complicate the patient evaluation  Hypertension   Additional history obtained:  Additional history obtained from the patient External records from outside source obtained and reviewed including hospital records   Lab Tests:  I Ordered, and personally interpreted labs.  The pertinent results include: CBC chemistries and lactic negative   Imaging Studies ordered:  Chest x-ray done which shows no acute disease.  X-ray was reviewed by me and I agree with radiologist interpretation  Cardiac Monitoring: / EKG:  The patient was maintained on a cardiac monitor.  I personally viewed and interpreted the cardiac monitored which showed an underlying rhythm of: Normal sinus rhythm   Consultations Obtained: None  Problem List / ED Course  / Critical interventions / Medication management  Weakness, hypertension I ordered medication including normal saline for dehydration Reevaluation of the patient after these medicines showed that the patient improved I have reviewed the patients home medicines and have made adjustments as needed   Social Determinants of Health:  None   Test / Admission - Considered: Patient does not require hospitalization   Patient with dehydration and hypotension that resolved with normal saline        Final Clinical Impression(s) / ED Diagnoses Final diagnoses:  Dehydration    Rx / DC Orders ED Discharge Orders     None         Milton Ferguson, MD 12/07/21 1247

## 2021-12-08 DIAGNOSIS — R55 Syncope and collapse: Secondary | ICD-10-CM | POA: Diagnosis not present

## 2021-12-14 ENCOUNTER — Telehealth: Payer: Self-pay

## 2021-12-14 ENCOUNTER — Ambulatory Visit: Payer: Medicare HMO | Admitting: Family Medicine

## 2021-12-14 ENCOUNTER — Ambulatory Visit (HOSPITAL_COMMUNITY): Admission: RE | Admit: 2021-12-14 | Payer: Medicare HMO | Source: Ambulatory Visit

## 2021-12-14 NOTE — Telephone Encounter (Addendum)
Patient  visit on 12/05/2021  at [facility] was for dehydration  Have you been able to follow up with your primary care physician? - Pt has a 77month follow up today, however, patient advised CG he was not going to appt due to being sick. Patient also shared with CG his BP numbers. CG advised pt that this appt would be beneficial as he still does not feel well. Pt declined appt. CG advised patient PCP office will be contacted to share this information and patient agreed that was fine to do. CG spoke to clinic staff and advised pt's bp number. Staff stated they would change his appt description from follow up to sick visit and encouraged patient to come in. CG contacted patient and advised this information and patient advised he was not feeling up to driving. CG offered [facility] transportation and went over the necessary information on this with patient. Patient agreed to rider's waiver that is listed below.   The patient was or was not able to obtain any needed medicine or equipment. - Patient was able to obtain bp machine.  Are there diet recommendations that you are having difficulty following? - No  Patient expresses understanding of discharge instructions and education provided has no other needs at this time.     Jared Li DOB: 07/06/1965 MRN: [id number]   RIDER WAIVER AND RELEASE OF LIABILITY  For purposes of improving physical access to our facilities, [facility] is pleased to partner with third parties to provide [facility] patients or other authorized individuals the option of convenient, on-demand ground transportation services (the [employer]") through use of the technology service that enables users to request on-demand ground transportation from independent third-party providers.  By opting to use and accept these [employer], I, the undersigned, hereby agree on behalf of myself, and on behalf of any minor child using the Science writer for whom I am the  parent or legal guardian, as follows:  Science writer provided to me are provided by independent third-party transportation providers who are not [employer] or employees and who are unaffiliated with [employer]. [facility] is neither a transportation carrier nor a common or public carrier. [facility] has no control over the quality or safety of the transportation that occurs as a result of the [employer]. [facility] cannot guarantee that any third-party transportation provider will complete any arranged transportation service. [facility] makes no representation, warranty, or guarantee regarding the reliability, timeliness, quality, safety, suitability, or availability of any of the Transport Services or that they will be error free. I fully understand that traveling by vehicle involves risks and dangers of serious bodily injury, including permanent disability, paralysis, and death. I agree, on behalf of myself and on behalf of any minor child using the Transport Services for whom I am the parent or legal guardian, that the entire risk arising out of my use of the [employer] remains solely with me, to the maximum extent permitted under applicable law. The [employer] are provided "as is" and "as available." [facility] disclaims all representations and warranties, express, implied or statutory, not expressly set out in these terms, including the implied warranties of merchantability and fitness for a particular purpose. I hereby waive and release [facility], its agents, employees, officers, directors, representatives, insurers, attorneys, assigns, successors, subsidiaries, and affiliates from any and all past, present, or future claims, demands, liabilities, actions, causes of action, or suits of any kind directly or  indirectly arising from acceptance and use of the [employer]. I further waive and release [facility] and its affiliates from all present and  future liability and responsibility for any injury or death to persons or damages to property caused by or related to the use of the [employer]. I have read this Waiver and Release of Liability, and I understand the terms used in it and their legal significance. This Waiver is freely and voluntarily given with the understanding that my right (as well as the right of any minor child for whom I am the parent or legal guardian using the [employer]) to legal recourse against [facility] in connection with the [employer] is knowingly surrendered in return for use of these services.   I attest that I read the consent document to Jared Li, gave Mr. Windt the opportunity to ask questions and answered the questions asked (if any). I affirm that Jared Li then provided consent for he's participation in this program.     [place] booked for 12/14/2021. Patient opted for the text feature. Clinic advised of [facility] concierge line number if further assistance is needed or they can contact [place], [place], [place]  Main Phone: [phone]  E-mail: [email or web address]  Website: [email or web address]

## 2021-12-14 NOTE — Telephone Encounter (Signed)
   Telephone encounter was:  Successful.  12/14/2021 Name: Jared Li MRN: [id number] DOB: Feb 25, 1966  Jared Li is a 56 y.o. year old male who is a primary care patient of Coral Spikes, DO . The community resource team was consulted for assistance with Transportation Needs   Care guide performed the following interventions:  Patient advised ride for 12/14/2021 was cancelled. CG called Transportation to inquire the issue. At this current time, there are no drivers available. Clinic has been notified and rescheduled appt for Tuesday. Patient advised. CG inquired if patient needed ride for 10/3 appt and patient stated no, his brother will be back in town and can take him. CG encouraged pt to please call if anything changes. Patient understood.  Follow Up Plan:  No further follow up planned at this time. The patient has been provided with needed resources.  [facility] management  [place], [place]  Main Phone: [phone]  E-mail: [email or web address]  Website: [email or web address]

## 2021-12-18 ENCOUNTER — Ambulatory Visit: Payer: Medicare HMO | Admitting: Family Medicine

## 2021-12-20 ENCOUNTER — Telehealth: Payer: Self-pay

## 2021-12-20 DIAGNOSIS — R7989 Other specified abnormal findings of blood chemistry: Secondary | ICD-10-CM

## 2021-12-20 NOTE — Telephone Encounter (Signed)
Caller name:Demetrius Hermelinda Dellen   On DPR? :Yes  Call back number:[phone]  Provider they see: Lacinda Axon   Reason for call:Pt is calling wanting to know if Dr Lacinda Axon wants him doing repeat blood work

## 2021-12-20 NOTE — Telephone Encounter (Signed)
Lab orders placed and pt is aware 

## 2021-12-20 NOTE — Telephone Encounter (Signed)
Active labs in from 11/29/21 CMP and 11/26/21 Testosterone. Last completed labs 11/26/21 Lipid, TSH, CMP14+EGFR and CBC. Please advise. Thank you.

## 2021-12-21 DIAGNOSIS — R7989 Other specified abnormal findings of blood chemistry: Secondary | ICD-10-CM | POA: Diagnosis not present

## 2021-12-22 LAB — TESTOSTERONE: Testosterone: 356 ng/dL (ref 264–916)

## 2021-12-22 LAB — BASIC METABOLIC PANEL
BUN/Creatinine Ratio: 8 — ABNORMAL LOW (ref 9–20)
BUN: 10 mg/dL (ref 6–24)
CO2: 23 mmol/L (ref 20–29)
Calcium: 9.1 mg/dL (ref 8.7–10.2)
Chloride: 98 mmol/L (ref 96–106)
Creatinine, Ser: 1.23 mg/dL (ref 0.76–1.27)
Glucose: 112 mg/dL — ABNORMAL HIGH (ref 70–99)
Potassium: 4 mmol/L (ref 3.5–5.2)
Sodium: 142 mmol/L (ref 134–144)
eGFR: 69 mL/min/{1.73_m2} (ref >59)

## 2021-12-25 ENCOUNTER — Ambulatory Visit: Payer: Medicare HMO | Admitting: Family Medicine

## 2022-01-01 DIAGNOSIS — R55 Syncope and collapse: Secondary | ICD-10-CM | POA: Diagnosis not present

## 2022-01-02 ENCOUNTER — Encounter: Payer: Self-pay | Admitting: Family Medicine

## 2022-01-02 ENCOUNTER — Ambulatory Visit (INDEPENDENT_AMBULATORY_CARE_PROVIDER_SITE_OTHER): Payer: Medicare HMO | Admitting: Family Medicine

## 2022-01-02 DIAGNOSIS — R55 Syncope and collapse: Secondary | ICD-10-CM | POA: Diagnosis not present

## 2022-01-02 DIAGNOSIS — R112 Nausea with vomiting, unspecified: Secondary | ICD-10-CM

## 2022-01-02 MED ORDER — ONDANSETRON 4 MG PO TBDP: 4.0000 mg | ORAL_TABLET | Freq: Three times a day (TID) | ORAL | 3 refills | Status: AC | PRN

## 2022-01-02 NOTE — Patient Instructions (Addendum)
Be sure to reschedule the Echo.  Zofran as prescribed.  Be sure to stay hydrated.  Follow up in 6 months.

## 2022-01-03 DIAGNOSIS — R112 Nausea with vomiting, unspecified: Secondary | ICD-10-CM | POA: Insufficient documentation

## 2022-01-03 NOTE — Assessment & Plan Note (Addendum)
Overall he is doing better and has had no syncope since discontinuation of clonidine. Advised that he needs to get his echocardiogram.  Needs follow-up with cardiology.

## 2022-01-03 NOTE — Progress Notes (Signed)
Subjective:  Patient ID: Jared Li, male    DOB: Apr 14, 1965  Age: 56 y.o. MRN: [id number]  CC: Chief Complaint  Patient presents with   Follow-up    Pt having n/v for last couple of weeks.     HPI:  56 year old male with migraine, GERD, chronic pain on chronic opiates, anxiety, hyperlipidemia presents for follow-up.  Patient has had ongoing issues with syncope.  Has now seen cardiology.  Has Zio patch.  Has not had an echocardiogram.  He has had no recent syncopal episodes.  He feels better after clonidine was discontinued.  His blood pressure is normal today.  He reports ongoing nausea and vomiting since last week.  He states that he typically has some nausea associated with severe pain but that this feels different.  He is on dronabinol and states that this does not seem to be helping at this time.  Patient Active Problem List   Diagnosis Date Noted   Nausea and vomiting 01/03/2022   GERD (gastroesophageal reflux disease) 11/14/2021   HLD (hyperlipidemia) 11/14/2021   Syncope 11/14/2021   Chronic pain syndrome 11/13/2021   Migraine headache 11/13/2021   Opiate dependence ([place]) 12/06/2020   Hypogonadism male 01/18/2018    Social Hx   Social History   Socioeconomic History   Marital status: Divorced    Spouse name: Not on file   Number of children: Not on file   Years of education: Not on file   Highest education level: Not on file  Occupational History   Not on file  Tobacco Use   Smoking status: Former    Years: 35.00    Types: Cigarettes    Quit date: 06/02/2016    Years since quitting: 5.5   Smokeless tobacco: Former   Tobacco comments:    used chewing tabacco as a child  Substance and Sexual Activity   Alcohol use: No    Alcohol/week: 0.0 standard drinks of alcohol    Comment: occasional   Drug use: No   Sexual activity: Not on file  Other Topics Concern   Not on file  Social History Narrative   Not on file   Social Determinants of Health    Financial Resource Strain: Not on file  Food Insecurity: Not on file  Transportation Needs: Not on file  Physical Activity: Not on file  Stress: Not on file  Social Connections: Not on file    Review of Systems Per HPI  Objective:  BP 124/84   Pulse 62   Temp (!) 97.2 F (36.2 C)   Wt 184 lb 12.8 oz (83.8 kg)   SpO2 97%   BMI 26.52 kg/m      01/02/2022   10:40 AM 12/05/2021    6:00 PM 12/05/2021    5:55 PM  BP/Weight  Systolic BP [phone]  Diastolic BP 84 85 90  Wt. (Lbs) 184.8    BMI 26.52 kg/m2      Physical Exam Vitals and nursing note reviewed.  Constitutional:      General: He is not in acute distress. HENT:     Head: Normocephalic and atraumatic.  Eyes:     General:        Right eye: No discharge.        Left eye: No discharge.     Conjunctiva/sclera: Conjunctivae normal.  Cardiovascular:     Rate and Rhythm: Normal rate and regular rhythm.  Pulmonary:     Effort: Pulmonary effort is normal.  Breath sounds: Normal breath sounds. No wheezing, rhonchi or rales.  Neurological:     Mental Status: He is alert.     Lab Results  Component Value Date   WBC 8.1 12/05/2021   HGB 15.5 12/05/2021   HCT 46.1 12/05/2021   PLT 187 12/05/2021   GLUCOSE 112 (H) 12/21/2021   CHOL 163 11/26/2021   TRIG 179 (H) 11/26/2021   HDL 30 (L) 11/26/2021   LDLCALC 102 (H) 11/26/2021   ALT 16 12/05/2021   AST 18 12/05/2021   NA 142 12/21/2021   K 4.0 12/21/2021   CL 98 12/21/2021   CREATININE 1.23 12/21/2021   BUN 10 12/21/2021   CO2 23 12/21/2021   TSH 3.020 11/26/2021   INR 1.3 (H) 12/06/2020     Assessment & Plan:   Problem List Items Addressed This Visit       Digestive   Nausea and vomiting    Unclear etiology.  I am concerned that his medication/polypharmacy is the culprit.  Overall well-appearing.  Zofran as directed.        Other   Syncope    Overall he is doing better and has had no syncope since discontinuation of  clonidine. Advised that he needs to get his echocardiogram.  Needs follow-up with cardiology.       Meds ordered this encounter  Medications   ondansetron (ZOFRAN-ODT) 4 MG disintegrating tablet    Sig: Take 1-2 tablets (4-8 mg total) by mouth every 8 (eight) hours as needed for nausea, vomiting or refractory nausea / vomiting.    Dispense:  20 tablet    Refill:  3    Follow-up:  Return in about 6 months (around 07/04/2022).  Everlene Other DO [facility]

## 2022-01-03 NOTE — Assessment & Plan Note (Signed)
Unclear etiology.  I am concerned that his medication/polypharmacy is the culprit.  Overall well-appearing.  Zofran as directed.

## 2022-01-15 ENCOUNTER — Encounter: Payer: Self-pay | Admitting: Medical

## 2022-01-15 ENCOUNTER — Ambulatory Visit: Payer: Medicare HMO | Attending: Medical | Admitting: Medical

## 2022-01-15 ENCOUNTER — Ambulatory Visit (HOSPITAL_COMMUNITY)
Admission: RE | Admit: 2022-01-15 | Discharge: 2022-01-15 | Disposition: A | Discharge status: Home / Self Care | Payer: Medicare HMO | Source: Ambulatory Visit | Attending: Medical | Admitting: Medical

## 2022-01-15 ENCOUNTER — Telehealth: Payer: Self-pay | Admitting: Medical

## 2022-01-15 VITALS — BP 112/66 | HR 89 | Ht 70.0 in | Wt 188.2 lb

## 2022-01-15 DIAGNOSIS — R55 Syncope and collapse: Secondary | ICD-10-CM

## 2022-01-15 DIAGNOSIS — I959 Hypotension, unspecified: Secondary | ICD-10-CM | POA: Insufficient documentation

## 2022-01-15 LAB — ECHOCARDIOGRAM COMPLETE
AR max vel: 2.99 cm2
AV Area VTI: 2.88 cm2
AV Area mean vel: 2.86 cm2
AV Mean grad: 5 mmHg
AV Peak grad: 9 mmHg
Ao pk vel: 1.5 m/s
Area-P 1/2: 3.53 cm2
Height: 70 in
S' Lateral: 2.8 cm
Weight: 3011.2 oz

## 2022-01-15 NOTE — Progress Notes (Signed)
Cardiology Office Note:    Date:  01/15/2022   ID:  Jared Li, DOB 10/28/1965, MRN [id number]  PCP:  Cook, Jayce G, DO  [facility] Cardiologist:  None  [facility] Electrophysiologist:  None   Referring MD: Cook, Jayce G, DO   Chief Complaint: 6 week follow-up  History of Present Illness:    Jared Li is a 55 y.o. male with a hx of GERD, cada equina syndrome, chronic pain, tobacco use who presents for 6 week follow-up.  Seen 10/2020 for chest pressure. Cardiac CTA was ordered. This showed coronary calcium score of 0, no CAD.  Last seen 12/05/21 and reported syncopal episode.  BP was low 79/54.  He was smoking cigarettes and marijuana.  Orthostatics were negative.  Clonidine was weaned down.  Heart monitor was ordered, as well as an echo.  ER evaluation was recommended.  Patient ultimately ended up going to the ER and given IV fluids for low blood pressure.  Today, BP is good, 112/66. He is Off clonidine. No other dizziness or lightheadedness. He is overall feeling good. He denies chest pain, shortness of breath, lower leg edema, orthopnea or pnd. He has the echo today. Heart monitor was reviewed.   Past Medical History:  Diagnosis Date   Adjustment disorder    Arthritis    Asthma    Cauda equina syndrome (HCC)    Cellulitis of lower limb    Cervical radiculopathy    Chronic intractable headache    Mirgraines- lst one 06/02/16   Chronic pain    Edema    GERD (gastroesophageal reflux disease)    History of kidney stones    passed   Insomnia    Loose stools    Lumbar post-laminectomy syndrome    MRSA bacteremia    Open wound of lumbar region with complication    Physical deconditioning    Tobacco use disorder     Past Surgical History:  Procedure Laterality Date   APPLICATION OF A-CELL OF HEAD/NECK N/A 06/13/2016   Procedure: neck closure AFTER SPINE SURGERY WITH placement of wound vac.;  Surgeon: Claire S Dillingham, DO;  Location: [facility] OR;   Service: Plastics;  Laterality: N/A;   CARPAL TUNNEL RELEASE Bilateral    CERVICAL FUSION     Numerous   LUMBAR FUSION     Numerous   Pain Pump insertion     x 2   POSTERIOR CERVICAL FUSION/FORAMINOTOMY N/A 06/13/2016   Procedure: POSTERIOR CERVICAL DECOMPRESSION FUSION, CERVICAL 3-4, CERVICAL 4-5, CERVICAL 5-6, CERVICAL 6-7 WITH INSTRUMENTATION AND ALLOGRAFT;  Surgeon: Mark Dumonski, MD;  Location: [facility] OR;  Service: Orthopedics;  Laterality: N/A;  POSTERIOR CERVICAL DECOMPRESSION FUSION, CERVICAL 3-4, CERVICAL 4-5, CERVICAL 5-6, CERVICAL 6-7 WITH INSTRUMENTATION AND ALLOGRAFT   POSTERIOR LAMINECTOMY / DECOMPRESSION LUMBAR SPINE     removal of pain pump     x 2   Spinal Card Stimulator Removal      x 2   SPINAL CORD STIMULATOR INSERTION      x2    Current Medications: Current Meds  Medication Sig   albuterol (VENTOLIN HFA) 108 (90 Base) MCG/ACT inhaler Inhale 2 puffs into the lungs every 6 (six) hours as needed for wheezing or shortness of breath.   atorvastatin (LIPITOR) 40 MG tablet Take 1 tablet (40 mg total) by mouth daily.   Cholecalciferol (VITAMIN D3) 50 MCG (2000 UT) capsule    diphenhydrAMINE (BENADRYL) 25 MG tablet Take 25 mg by mouth every 6 (six) hours as needed   for itching.    dronabinol (MARINOL) 5 MG capsule Take one capsule by mouth twice daily before meals   GRALISE 600 MG TABS Take 3,000 mg by mouth daily with supper. Takes 5 tablets at dinner   LORazepam (ATIVAN) 0.5 MG tablet Take 0.5 mg by mouth 2 (two) times daily as needed.   Multiple Vitamin (MULTIVITAMIN WITH MINERALS) TABS tablet Take 1 tablet by mouth daily.   Omega-3 Fatty Acids (FISH OIL) 500 MG CAPS    omeprazole (PRILOSEC OTC) 20 MG tablet Take 20 mg by mouth daily as needed.    ondansetron (ZOFRAN-ODT) 4 MG disintegrating tablet Take 1-2 tablets (4-8 mg total) by mouth every 8 (eight) hours as needed for nausea, vomiting or refractory nausea / vomiting.   oxyCODONE (ROXICODONE) 15 MG immediate release  tablet Take 15 mg by mouth every 4 (four) hours as needed for pain.   rizatriptan (MAXALT) 10 MG tablet Take 10 mg by mouth every 2 (two) hours as needed for migraine. May repeat in 2 hours if needed    sertraline (ZOLOFT) 100 MG tablet Take 100 mg by mouth at bedtime. Takes 2 tablets   Simethicone (GAS-X PO) Take 2 tablets by mouth 2 (two) times daily as needed (gas).   Testosterone 12.5 MG/ACT (1%) GEL SMARTSIG:4 pump Topical Daily   vitamin C (ASCORBIC ACID) 500 MG tablet Take 500 mg by mouth 2 (two) times daily.   zinc sulfate 220 (50 Zn) MG capsule Take 220 mg by mouth daily.     Allergies:   Hydrocodone, Adhesive [tape], Codeine, and Morphine and related   Social History   Socioeconomic History   Marital status: Divorced    Spouse name: Not on file   Number of children: Not on file   Years of education: Not on file   Highest education level: Not on file  Occupational History   Not on file  Tobacco Use   Smoking status: Every Day    Packs/day: 0.50    Years: 35.00    Total pack years: 17.50    Types: Cigarettes    Last attempt to quit: 06/02/2016    Years since quitting: 5.6   Smokeless tobacco: Former   Tobacco comments:    used chewing tabacco as a child  Scientific laboratory technician Use: Never used  Substance and Sexual Activity   Alcohol use: Yes    Comment: occasional   Drug use: No   Sexual activity: Not on file  Other Topics Concern   Not on file  Social History Narrative   Not on file   Social Determinants of Health   Financial Resource Strain: Not on file  Food Insecurity: Not on file  Transportation Needs: Not on file  Physical Activity: Not on file  Stress: Not on file  Social Connections: Not on file     Family History: The patient's family history includes Alpha-1 antitrypsin deficiency in his father; Other in an other family member.  ROS:   Please see the history of present illness.     All other systems reviewed and are negative.  EKGs/Labs/Other  Studies Reviewed:    The following studies were reviewed today:  Heart monitor 12/2021 Patch Wear Time:  11 days and 14 hours (2023-09-23T04:05:20-0400 to 2023-10-04T18:57:40-0400)   Patient had a min HR of 37 bpm, max HR of 157 bpm, and avg HR of 83 bpm. Predominant underlying rhythm was Sinus Rhythm. Isolated SVEs were rare (<1.0%), SVE Couplets were rare (<1.0%), and  SVE Triplets were rare (<1.0%). Isolated VEs were rare (<1.0%),  VE Couplets were rare (<1.0%), and no VE Triplets were present. Ventricular Trigeminy was present. Inverted QRS complexes possibly due to inverted placement of device.   Conclusion Cardiac monitor with no arrhythmias to suggest etiology of syncope Patient triggered events associated with rare PACs   Cardiac CT 10/2020 IMPRESSION: 1. Normal coronary calcium score of 0. Patient is low risk for coronary events.   2.  Normal coronary origin with right dominance.   3.  No evidence of CAD.   4.  CAD-RADS 0. Consider non-atherosclerotic causes of chest pain.    EKG:  EKG is not ordered today.   Recent Labs: 11/26/2021: TSH 3.020 12/05/2021: ALT 16; Hemoglobin 15.5; Platelets 187 12/21/2021: BUN 10; Creatinine, Ser 1.23; Potassium 4.0; Sodium 142  Recent Lipid Panel    Component Value Date/Time   CHOL 163 11/26/2021 1026   TRIG 179 (H) 11/26/2021 1026   HDL 30 (L) 11/26/2021 1026   CHOLHDL 5.4 (H) 11/26/2021 1026   LDLCALC 102 (H) 11/26/2021 1026    Physical Exam:    VS:  BP 112/66   Pulse 89   Ht 5' 10" (1.778 m)   Wt 188 lb 3.2 oz (85.4 kg)   SpO2 96%   BMI 27.00 kg/m     Wt Readings from Last 3 Encounters:  01/15/22 188 lb 3.2 oz (85.4 kg)  01/02/22 184 lb 12.8 oz (83.8 kg)  12/05/21 186 lb 9.6 oz (84.6 kg)     GEN:  Well nourished, well developed in no acute distress HEENT: Normal NECK: No JVD; No carotid bruits LYMPHATICS: No lymphadenopathy CARDIAC: RRR, no murmurs, rubs, gallops RESPIRATORY:  +wheezing ABDOMEN: Soft, non-tender,  non-distended MUSCULOSKELETAL:  No edema; No deformity  SKIN: Warm and dry NEUROLOGIC:  Alert and oriented x 3 PSYCHIATRIC:  Normal affect   ASSESSMENT:    1. Syncope and collapse   2. Hypotension, unspecified hypotension type    PLAN:    In order of problems listed above:  Syncope Hypotension Syncope most likely due to hypotension. He went to the ER later that day and was given IVF bolus  for hypotension with improvement. Clonidine was weaned off. BP today is normal, 112/66.  He denies recurrent dizziness or lightheadedness.  Heart monitor showed normal sinus rhythm, no arrhythmias.  Patient is scheduled for an echocardiogram today.  Discussed heart healthy diet.  Patient plans on starting walking program.  We will call patient with echo results.  Disposition: Follow up in 6 month(s) with MD/APP    Signed, Naiara Lombardozzi Ninfa Meeker, PA-C  01/15/2022 2:31 PM    [facility]

## 2022-01-15 NOTE — Patient Instructions (Signed)
Medication Instructions:  Your physician recommends that you continue on your current medications as directed. Please refer to the Current Medication list given to you today.  *If you need a refill on your cardiac medications before your next appointment, please call your pharmacy*   Lab Work: NONE   If you have labs (blood work) drawn today and your tests are completely normal, you will receive your results only by: [place] (if you have MyChart) OR A paper copy in the mail If you have any lab test that is abnormal or we need to change your treatment, we will call you to review the results.   Testing/Procedures: NONE    Follow-Up: At [facility], you and your health needs are our priority.  As part of our continuing mission to provide you with exceptional heart care, we have created designated Provider Care Teams.  These Care Teams include your primary Cardiologist (physician) and Advanced Practice Providers (APPs -  Physician Assistants and Nurse Practitioners) who all work together to provide you with the care you need, when you need it.  We recommend signing up for the patient portal called "MyChart".  Sign up information is provided on this After Visit Summary.  MyChart is used to connect with patients for Virtual Visits (Telemedicine).  Patients are able to view lab/test results, encounter notes, upcoming appointments, etc.  Non-urgent messages can be sent to your provider as well.   To learn more about what you can do with MyChart, go to [email or web address].    Your next appointment:   6 month(s)  The format for your next appointment:   In Person  Provider:   With MD      Other Instructions Thank you for choosing [facility]!    Important Information About Sugar

## 2022-01-15 NOTE — Telephone Encounter (Signed)
Pt came back in after appointment and asked if Cadence would order a Cardiac CT for peace of mind for him.

## 2022-01-15 NOTE — Progress Notes (Signed)
*  PRELIMINARY RESULTS* Echocardiogram 2D Echocardiogram has been performed.  Jared Li 01/15/2022, 3:40 PM

## 2022-01-16 NOTE — Telephone Encounter (Signed)
Pt notified that cardiac CT is not indicated at this time. Pt voiced understanding.

## 2022-02-15 HISTORY — PX: STOMACH SURGERY: SHX791

## 2022-02-25 ENCOUNTER — Other Ambulatory Visit: Payer: Self-pay | Admitting: Family Medicine

## 2022-03-11 DIAGNOSIS — K921 Melena: Secondary | ICD-10-CM | POA: Diagnosis not present

## 2022-03-11 DIAGNOSIS — K922 Gastrointestinal hemorrhage, unspecified: Secondary | ICD-10-CM | POA: Diagnosis not present

## 2022-03-11 DIAGNOSIS — R1013 Epigastric pain: Secondary | ICD-10-CM | POA: Diagnosis not present

## 2022-03-11 DIAGNOSIS — R1110 Vomiting, unspecified: Secondary | ICD-10-CM | POA: Diagnosis not present

## 2022-03-11 DIAGNOSIS — R1084 Generalized abdominal pain: Secondary | ICD-10-CM | POA: Diagnosis not present

## 2022-03-11 DIAGNOSIS — K920 Hematemesis: Secondary | ICD-10-CM | POA: Diagnosis not present

## 2022-03-12 DIAGNOSIS — I10 Essential (primary) hypertension: Secondary | ICD-10-CM | POA: Diagnosis not present

## 2022-03-12 DIAGNOSIS — R69 Illness, unspecified: Secondary | ICD-10-CM | POA: Diagnosis not present

## 2022-03-12 DIAGNOSIS — R10819 Abdominal tenderness, unspecified site: Secondary | ICD-10-CM | POA: Diagnosis not present

## 2022-03-12 DIAGNOSIS — K922 Gastrointestinal hemorrhage, unspecified: Secondary | ICD-10-CM | POA: Diagnosis not present

## 2022-03-12 DIAGNOSIS — G834 Cauda equina syndrome: Secondary | ICD-10-CM | POA: Diagnosis not present

## 2022-03-12 DIAGNOSIS — K259 Gastric ulcer, unspecified as acute or chronic, without hemorrhage or perforation: Secondary | ICD-10-CM | POA: Diagnosis not present

## 2022-03-12 DIAGNOSIS — G894 Chronic pain syndrome: Secondary | ICD-10-CM | POA: Diagnosis not present

## 2022-03-12 DIAGNOSIS — K254 Chronic or unspecified gastric ulcer with hemorrhage: Secondary | ICD-10-CM | POA: Diagnosis not present

## 2022-03-12 DIAGNOSIS — K760 Fatty (change of) liver, not elsewhere classified: Secondary | ICD-10-CM | POA: Diagnosis not present

## 2022-03-12 DIAGNOSIS — Z885 Allergy status to narcotic agent status: Secondary | ICD-10-CM | POA: Diagnosis not present

## 2022-03-12 DIAGNOSIS — R1310 Dysphagia, unspecified: Secondary | ICD-10-CM | POA: Diagnosis not present

## 2022-03-12 DIAGNOSIS — F17210 Nicotine dependence, cigarettes, uncomplicated: Secondary | ICD-10-CM | POA: Diagnosis not present

## 2022-03-12 DIAGNOSIS — Z79899 Other long term (current) drug therapy: Secondary | ICD-10-CM | POA: Diagnosis not present

## 2022-03-12 DIAGNOSIS — K279 Peptic ulcer, site unspecified, unspecified as acute or chronic, without hemorrhage or perforation: Secondary | ICD-10-CM | POA: Diagnosis not present

## 2022-03-12 DIAGNOSIS — Z888 Allergy status to other drugs, medicaments and biological substances status: Secondary | ICD-10-CM | POA: Diagnosis not present

## 2022-03-12 DIAGNOSIS — K449 Diaphragmatic hernia without obstruction or gangrene: Secondary | ICD-10-CM | POA: Diagnosis not present

## 2022-03-12 DIAGNOSIS — K920 Hematemesis: Secondary | ICD-10-CM | POA: Diagnosis not present

## 2022-03-12 DIAGNOSIS — K921 Melena: Secondary | ICD-10-CM | POA: Diagnosis not present

## 2022-03-12 DIAGNOSIS — R1013 Epigastric pain: Secondary | ICD-10-CM | POA: Diagnosis not present

## 2022-03-13 DIAGNOSIS — K922 Gastrointestinal hemorrhage, unspecified: Secondary | ICD-10-CM | POA: Diagnosis not present

## 2022-03-13 DIAGNOSIS — K279 Peptic ulcer, site unspecified, unspecified as acute or chronic, without hemorrhage or perforation: Secondary | ICD-10-CM | POA: Diagnosis not present

## 2022-04-03 ENCOUNTER — Telehealth: Payer: Self-pay | Admitting: Family Medicine

## 2022-04-03 NOTE — Telephone Encounter (Signed)
  Left message for patient to call back and schedule Medicare Annual Wellness Visit (AWV) in office.   If unable to come into the office for AWV,  please offer to do virtually or by telephone.  No hx of AWV eligible for AWVI per [place] as of   05/16/2017  Please schedule at anytime with [facility]-Nurse Health Advisor.      45 minute appointment   Any questions, please call me.  Thank you,   Stephanie  Ambulatory Clinical Support for [facility] Are. We Are. [facility] ??[phone] or ??[phone]

## 2022-05-16 ENCOUNTER — Encounter: Payer: Self-pay | Admitting: Radiology

## 2022-06-25 ENCOUNTER — Telehealth: Payer: Self-pay | Admitting: Family Medicine

## 2022-06-25 NOTE — Telephone Encounter (Signed)
Patient has 6 month follow up in May and would like labs done.

## 2022-06-25 NOTE — Telephone Encounter (Signed)
Patient has 6 month follow up in May and would like labs done.  

## 2022-06-28 ENCOUNTER — Other Ambulatory Visit: Payer: Self-pay

## 2022-06-28 DIAGNOSIS — E875 Hyperkalemia: Secondary | ICD-10-CM

## 2022-06-28 DIAGNOSIS — E785 Hyperlipidemia, unspecified: Secondary | ICD-10-CM

## 2022-06-28 DIAGNOSIS — R7989 Other specified abnormal findings of blood chemistry: Secondary | ICD-10-CM

## 2022-06-28 DIAGNOSIS — R55 Syncope and collapse: Secondary | ICD-10-CM

## 2022-06-28 NOTE — Telephone Encounter (Signed)
Patient made aware and appt moved up .

## 2022-07-01 ENCOUNTER — Ambulatory Visit: Payer: Medicare HMO | Admitting: Family Medicine

## 2022-07-03 ENCOUNTER — Ambulatory Visit (INDEPENDENT_AMBULATORY_CARE_PROVIDER_SITE_OTHER): Payer: Medicare HMO | Admitting: Family Medicine

## 2022-07-03 DIAGNOSIS — R5383 Other fatigue: Secondary | ICD-10-CM | POA: Diagnosis not present

## 2022-07-03 DIAGNOSIS — R69 Illness, unspecified: Secondary | ICD-10-CM | POA: Diagnosis not present

## 2022-07-03 DIAGNOSIS — F321 Major depressive disorder, single episode, moderate: Secondary | ICD-10-CM

## 2022-07-03 DIAGNOSIS — R1012 Left upper quadrant pain: Secondary | ICD-10-CM | POA: Diagnosis not present

## 2022-07-03 DIAGNOSIS — E785 Hyperlipidemia, unspecified: Secondary | ICD-10-CM

## 2022-07-03 DIAGNOSIS — K279 Peptic ulcer, site unspecified, unspecified as acute or chronic, without hemorrhage or perforation: Secondary | ICD-10-CM

## 2022-07-03 MED ORDER — BUPROPION HCL ER (XL) 150 MG PO TB24: 150.0000 mg | ORAL_TABLET | Freq: Every day | ORAL | 1 refills | Status: DC

## 2022-07-03 MED ORDER — ALBUTEROL SULFATE HFA 108 (90 BASE) MCG/ACT IN AERS: INHALATION_SPRAY | RESPIRATORY_TRACT | 3 refills | Status: DC

## 2022-07-03 MED ORDER — ATORVASTATIN CALCIUM 40 MG PO TABS: 40.0000 mg | ORAL_TABLET | Freq: Every day | ORAL | 3 refills | Status: DC

## 2022-07-03 MED ORDER — OMEPRAZOLE 40 MG PO CPDR: 40.0000 mg | DELAYED_RELEASE_CAPSULE | Freq: Every day | ORAL | 1 refills | Status: DC

## 2022-07-03 NOTE — Patient Instructions (Addendum)
Labs today.  I have order the CT scan. We will be in contact regarding scheduling.  I have added Wellbutrin for your depression.  I will place referral to GI.  Follow up in 3 months.

## 2022-07-04 LAB — CBC
Hematocrit: 45.3 % (ref 37.5–51.0)
Hemoglobin: 14.8 g/dL (ref 13.0–17.7)
MCH: 31 pg (ref 26.6–33.0)
MCHC: 32.7 g/dL (ref 31.5–35.7)
MCV: 95 fL (ref 79–97)
Platelets: 205 10*3/uL (ref 150–450)
RBC: 4.77 x10E6/uL (ref 4.14–5.80)
RDW: 12.9 % (ref 11.6–15.4)
WBC: 7.6 10*3/uL (ref 3.4–10.8)

## 2022-07-04 LAB — CMP14+EGFR
ALT: 10 IU/L (ref 0–44)
AST: 18 IU/L (ref 0–40)
Albumin/Globulin Ratio: 1.7 (ref 1.2–2.2)
Albumin: 4.6 g/dL (ref 3.8–4.9)
Alkaline Phosphatase: 112 IU/L (ref 44–121)
BUN/Creatinine Ratio: 6 — ABNORMAL LOW (ref 9–20)
BUN: 8 mg/dL (ref 6–24)
Bilirubin Total: 0.3 mg/dL (ref 0.0–1.2)
CO2: 26 mmol/L (ref 20–29)
Calcium: 9.7 mg/dL (ref 8.7–10.2)
Chloride: 105 mmol/L (ref 96–106)
Creatinine, Ser: 1.36 mg/dL — ABNORMAL HIGH (ref 0.76–1.27)
Globulin, Total: 2.7 g/dL (ref 1.5–4.5)
Glucose: 114 mg/dL — ABNORMAL HIGH (ref 70–99)
Potassium: 4.3 mmol/L (ref 3.5–5.2)
Sodium: 146 mmol/L — ABNORMAL HIGH (ref 134–144)
Total Protein: 7.3 g/dL (ref 6.0–8.5)
eGFR: 61 mL/min/{1.73_m2} (ref >59)

## 2022-07-04 LAB — LIPID PANEL
Chol/HDL Ratio: 2.6 ratio (ref 0.0–5.0)
Cholesterol, Total: 116 mg/dL (ref 100–199)
HDL: 44 mg/dL (ref >39)
LDL Chol Calc (NIH): 57 mg/dL (ref 0–99)
Triglycerides: 74 mg/dL (ref 0–149)
VLDL Cholesterol Cal: 15 mg/dL (ref 5–40)

## 2022-07-04 LAB — VITAMIN B12: Vitamin B-12: 683 pg/mL (ref 232–1245)

## 2022-07-04 NOTE — Progress Notes (Signed)
Subjective:  Patient ID: Jared Li, male    DOB: 1965-11-06  Age: 57 y.o. MRN: [id number]  CC: Chief Complaint  Patient presents with   6 month follow up     B12 shot discussion for exaustion, discuss depression, seasonal allergies medications recommendations, baseball size protrusion under left rib comes and goes, ER surgery for bleeding ulcer in December 2023    HPI:  57 year old male with migraine, GERD, hypogonadism, chronic pain syndrome and chronic opiate use, hyperlipidemia, history of syncope presents for follow-up.  Patient's GI bleed at the end of 2023.  He was treated at an outside hospital.  This was because excessive use of BC powder.  He is no longer taking BC powder or Goody powder and is also not taking NSAIDs.  Patient will need follow-up endoscopy.  Patient is not currently on PPI.  Needs labs today.  Patient reports ongoing issues with depression.  He states that he does not feel like his depression is well-controlled.  He is down and finds himself crying a lot.  He is currently on Cymbalta 30 mg twice daily.  He would like to discuss other treatment options.  Patient also reports ongoing intermittent pain in the left upper quadrant.  He states that he has a bulge in this area and then he subsequently moves around and presses down and the bulge resolves and pain improves.  It is unclear whether this is due to hernia or spasm.   Patient Active Problem List   Diagnosis Date Noted   PUD (peptic ulcer disease) 07/05/2022   Abdominal pain, left upper quadrant 07/05/2022   Depression, major, single episode, moderate 07/05/2022   GERD (gastroesophageal reflux disease) 11/14/2021   HLD (hyperlipidemia) 11/14/2021   Syncope 11/14/2021   Chronic pain syndrome 11/13/2021   Migraine headache 11/13/2021   Opiate dependence 12/06/2020   Hypogonadism male 01/18/2018    Social Hx   Social History   Socioeconomic History   Marital status: Divorced    Spouse name:  Not on file   Number of children: Not on file   Years of education: Not on file   Highest education level: Not on file  Occupational History   Not on file  Tobacco Use   Smoking status: Every Day    Packs/day: 0.50    Years: 35.00    Additional pack years: 0.00    Total pack years: 17.50    Types: Cigarettes    Last attempt to quit: 06/02/2016    Years since quitting: 6.0   Smokeless tobacco: Former   Tobacco comments:    used chewing tabacco as a child  Building services engineer Use: Never used  Substance and Sexual Activity   Alcohol use: Yes    Comment: occasional   Drug use: No   Sexual activity: Not on file  Other Topics Concern   Not on file  Social History Narrative   Not on file   Social Determinants of Health   Financial Resource Strain: Not on file  Food Insecurity: Not on file  Transportation Needs: Not on file  Physical Activity: Not on file  Stress: Not on file  Social Connections: Not on file    Review of Systems Per HPI  Objective:  BP 100/68   Pulse (!) 102   Temp 98.3 F (36.8 C)   Ht  (1.778 m)   Wt 163 lb (73.9 kg)   SpO2 98%   BMI 23.39 kg/m  07/03/2022    3:07 PM 01/15/2022    2:08 PM 01/02/2022   10:40 AM  BP/Weight  Systolic BP 100 112 124  Diastolic BP 68 66 84  Wt. (Lbs) 163 188.2 184.8  BMI 23.39 kg/m2 27 kg/m2 26.52 kg/m2    Physical Exam Vitals and nursing note reviewed.  Constitutional:      General: He is not in acute distress.    Appearance: Normal appearance.  HENT:     Head: Normocephalic and atraumatic.  Cardiovascular:     Rate and Rhythm: Normal rate and regular rhythm.  Pulmonary:     Effort: Pulmonary effort is normal.     Breath sounds: Normal breath sounds.  Abdominal:     General: There is no distension.     Palpations: Abdomen is soft.     Comments: Mild tenderness in the left upper quadrant.  No palpable hernia.  Neurological:     Mental Status: He is alert.  Psychiatric:     Comments:  Depressed mood.     Lab Results  Component Value Date   WBC 7.6 07/03/2022   HGB 14.8 07/03/2022   HCT 45.3 07/03/2022   PLT 205 07/03/2022   GLUCOSE 114 (H) 07/03/2022   CHOL 116 07/03/2022   TRIG 74 07/03/2022   HDL 44 07/03/2022   LDLCALC 57 07/03/2022   ALT 10 07/03/2022   AST 18 07/03/2022   NA 146 (H) 07/03/2022   K 4.3 07/03/2022   CL 105 07/03/2022   CREATININE 1.36 (H) 07/03/2022   BUN 8 07/03/2022   CO2 26 07/03/2022   TSH 3.020 11/26/2021   INR 1.3 (H) 12/06/2020     Assessment & Plan:   Problem List Items Addressed This Visit       Digestive   PUD (peptic ulcer disease)    Referring to GI.  Needs repeat EGD.  Avoid NSAIDs and aspirin.  Sending in PPI.      Relevant Orders   CBC (Completed)   CMP14+EGFR (Completed)   Ambulatory referral to Gastroenterology     Other   HLD (hyperlipidemia)   Relevant Medications   atorvastatin (LIPITOR) 40 MG tablet   Other Relevant Orders   Lipid panel (Completed)   Depression, major, single episode, moderate    Continue Cymbalta.  Adding Wellbutrin.      Relevant Medications   DULoxetine (CYMBALTA) 30 MG capsule   buPROPion (WELLBUTRIN XL) 150 MG 24 hr tablet   Abdominal pain, left upper quadrant    Etiology unclear at this time.  Arranging CT scan for further evaluation.      Relevant Orders   CT Abdomen Pelvis Wo Contrast   Other Visit Diagnoses     Fatigue, unspecified type       Relevant Orders   Vitamin B12 (Completed)       Meds ordered this encounter  Medications   atorvastatin (LIPITOR) 40 MG tablet    Sig: Take 1 tablet (40 mg total) by mouth daily.    Dispense:  90 tablet    Refill:  3   albuterol (VENTOLIN HFA) 108 (90 Base) MCG/ACT inhaler    Sig: TAKE 2 PUFFS BY MOUTH EVERY 6 HOURS AS NEEDED FOR WHEEZE OR SHORTNESS OF BREATH    Dispense:  18 each    Refill:  3   buPROPion (WELLBUTRIN XL) 150 MG 24 hr tablet    Sig: Take 1 tablet (150 mg total) by mouth daily.    Dispense:  90  tablet    Refill:  1   omeprazole (PRILOSEC) 40 MG capsule    Sig: Take 1 capsule (40 mg total) by mouth daily.    Dispense:  90 capsule    Refill:  1    Follow-up:  Return in about 3 months (around 10/02/2022).  Everlene Other DO [facility]

## 2022-07-05 DIAGNOSIS — K279 Peptic ulcer, site unspecified, unspecified as acute or chronic, without hemorrhage or perforation: Secondary | ICD-10-CM | POA: Insufficient documentation

## 2022-07-05 DIAGNOSIS — F321 Major depressive disorder, single episode, moderate: Secondary | ICD-10-CM | POA: Insufficient documentation

## 2022-07-05 DIAGNOSIS — R1012 Left upper quadrant pain: Secondary | ICD-10-CM | POA: Insufficient documentation

## 2022-07-05 NOTE — Assessment & Plan Note (Signed)
Etiology unclear at this time.  Arranging CT scan for further evaluation.

## 2022-07-05 NOTE — Assessment & Plan Note (Signed)
Continue Cymbalta.  Adding Wellbutrin.

## 2022-07-05 NOTE — Assessment & Plan Note (Signed)
Referring to GI.  Needs repeat EGD.  Avoid NSAIDs and aspirin.  Sending in PPI.

## 2022-07-08 ENCOUNTER — Other Ambulatory Visit: Payer: Self-pay

## 2022-07-08 DIAGNOSIS — N289 Disorder of kidney and ureter, unspecified: Secondary | ICD-10-CM

## 2022-07-09 ENCOUNTER — Telehealth: Payer: Self-pay | Admitting: Family Medicine

## 2022-07-09 NOTE — Telephone Encounter (Signed)
Called patient to schedule Medicare Annual Wellness Visit (AWV). Left message for patient to call back and schedule Medicare Annual Wellness Visit (AWV).  Last date of AWV: due 05/16/2017 awvi per [facility]  Please schedule an appointment at any time with Toni Amend, [facility] .  If any questions, please contact me at [phone].   Thank you,  Judeth Cornfield,  AMB Clinical Support [facility] AWV Program Direct Dial ??[phone]

## 2022-07-14 NOTE — Progress Notes (Signed)
GI Office Note    Referring Provider: Tommie Sams, DO Primary Care Physician:  Tommie Sams, DO  Primary Gastroenterologist: Hennie Duos. Marletta Lor, DO  Chief Complaint   Chief Complaint  Patient presents with   New Patient (Initial Visit)    Pt referred for PUD   History of Present Illness   [name] is a 57 y.o. male presenting today at the request of Tommie Sams, DO for peptic ulcer disease.  CT A/P w/o contrast scheduled 07/20/22 (ordered by PCP).   Patient was seen by PCP 07/03/2022.  Patient admitted to an outside hospital in December 2023 for GI bleed.  Stated he was treated and was found to be secondary to excessive use of aspirin powders.  At the time of his visit he reported he is no longer taking BC or Goody powders and no other NSAIDs.  Not currently on PPI.  Will need follow-up endoscopy.  Ongoing intermittent left upper quadrant pain.  Reports a bulge in the area that with palpation and pressing on it resolves and pain goes away. CBC and CMP as well as CT A/P without contrast ordered.   Check CBC and CMP and start omeprazole 40 mg once daily.  Labs 07/03/2022: Hemoglobin 14.8, platelets 205, sodium 146, normal LFTs and renal function.  Normal lipid panel and B12.  Today: On chronic pain medications but was used to having abdominal pain but on christmas day he was having severe epigastric pain but was sitting outside and out of the blue had projectile bright red blood and then loose black bowel movements. He was in [place] when that occurred. States he had to be transferred to [place] and states he had had staples placed due to a hole in his stomach. Was in the hospital for 4 nights and 5 days. Was sent home with pantoprazole 40mg . States he is still taking this once daily. No longer having melena. Denies brbpr.   Previously having nausea and vomiting, continues to have some nausea. States the pharmacy has been having trouble getting his Marinol. All of his  meds he has been on for about 15 years or longer. Marinol helps with the nausea. Has not been eating as much.   States he has lost 34 lbs since christmas. Lost all of teeth due to a bacterial infection about 2 years ago. Trying to eat at least once per day and tries to get his protein intake. He reports he wants to eat but once its presented to him he does not want it any longer.   Having some bulge that occurs in the middle of his abdomen and LUQ and when it pops out he has pain and notices it when he coughs.   Reports he recently switched the type of gabapentin and states increased urinary frequency.   Denies lower extremity edema, chest pain, shortness of breath.   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   Despite his renal function he would like a contrasted abdominal CT.   Rarely goes more than 10 days without a BM. Has been like this since 2007. Currently going once every 2-3 days. Not currently taking any stool softeners or miralax. He will take these if he goes over 10 days without a BM.   Father with history of alpha-1 antitrypsin deficiency.  Current Outpatient Medications  Medication Sig Dispense Refill   albuterol (VENTOLIN HFA) 108 (90 Base) MCG/ACT inhaler TAKE 2 PUFFS BY MOUTH EVERY 6  HOURS AS NEEDED FOR WHEEZE OR SHORTNESS OF BREATH 18 each 3   atorvastatin (LIPITOR) 40 MG tablet Take 1 tablet (40 mg total) by mouth daily. 90 tablet 3   buPROPion (WELLBUTRIN XL) 150 MG 24 hr tablet Take 1 tablet (150 mg total) by mouth daily. 90 tablet 1   Cholecalciferol (VITAMIN D3) 50 MCG (2000 UT) capsule      diphenhydrAMINE (BENADRYL) 25 MG tablet Take 25 mg by mouth every 6 (six) hours as needed for itching.      dronabinol (MARINOL) 5 MG capsule Take one capsule by mouth twice daily before meals 60 capsule 0   DULoxetine (CYMBALTA) 30 MG capsule Take by mouth.     Flaxseed, Linseed, (FLAXSEED OIL) 1000 MG CAPS Take by mouth.     GRALISE 600 MG TABS Take 3,000 mg  by mouth daily with supper. Takes 5 tablets at dinner  5   LORazepam (ATIVAN) 0.5 MG tablet Take 0.5 mg by mouth 2 (two) times daily as needed.     Multiple Vitamin (MULTIVITAMIN WITH MINERALS) TABS tablet Take 1 tablet by mouth daily.     Nutritional Supplements (ENSURE HIGH PROTEIN PO) Take 1 Can by mouth 4 (four) times daily.     Omega-3 Fatty Acids (FISH OIL) 500 MG CAPS   0   ondansetron (ZOFRAN) 4 MG tablet Take 4 mg by mouth every 8 (eight) hours as needed.     ondansetron (ZOFRAN-ODT) 4 MG disintegrating tablet Take 1-2 tablets (4-8 mg total) by mouth every 8 (eight) hours as needed for nausea, vomiting or refractory nausea / vomiting. 20 tablet 3   oxyCODONE (ROXICODONE) 15 MG immediate release tablet Take 15 mg by mouth every 4 (four) hours as needed for pain.     pantoprazole (PROTONIX) 40 MG tablet Take 40 mg by mouth daily.     rizatriptan (MAXALT) 10 MG tablet Take 10 mg by mouth every 2 (two) hours as needed for migraine. May repeat in 2 hours if needed      Simethicone (GAS-X PO) Take 2 tablets by mouth 2 (two) times daily as needed (gas).     Testosterone 12.5 MG/ACT (1%) GEL SMARTSIG:4 pump Topical Daily     Thiamine HCl (VITAMIN B-1) 250 MG tablet Take 250 mg by mouth daily.     vitamin C (ASCORBIC ACID) 500 MG tablet Take 500 mg by mouth 2 (two) times daily.     zinc sulfate 220 (50 Zn) MG capsule Take 220 mg by mouth daily. (Patient not taking: Reported on 07/15/2022)     No current facility-administered medications for this visit.    Past Medical History:  Diagnosis Date   Adjustment disorder    Arthritis    Asthma    Cauda equina syndrome (HCC)    Cellulitis of lower limb    Cervical radiculopathy    Chronic intractable headache    Mirgraines- lst one 06/02/16   Chronic pain    Edema    GERD (gastroesophageal reflux disease)    History of kidney stones    passed   Insomnia    Loose stools    Lumbar post-laminectomy syndrome    MRSA bacteremia    Open wound  of lumbar region with complication    Physical deconditioning    Tobacco use disorder     Past Surgical History:  Procedure Laterality Date   APPLICATION OF A-CELL OF HEAD/NECK N/A 06/13/2016   Procedure: neck closure AFTER SPINE SURGERY WITH placement of wound  vac.;  Surgeon: Alena Bills Dillingham, DO;  Location: [facility] OR;  Service: Plastics;  Laterality: N/A;   CARPAL TUNNEL RELEASE Bilateral    CERVICAL FUSION     Numerous   LUMBAR FUSION     Numerous   Pain Pump insertion     x 2   POSTERIOR CERVICAL FUSION/FORAMINOTOMY N/A 06/13/2016   Procedure: POSTERIOR CERVICAL DECOMPRESSION FUSION, CERVICAL 3-4, CERVICAL 4-5, CERVICAL 5-6, CERVICAL 6-7 WITH INSTRUMENTATION AND ALLOGRAFT;  Surgeon: Estill Bamberg, MD;  Location: [facility] OR;  Service: Orthopedics;  Laterality: N/A;  POSTERIOR CERVICAL DECOMPRESSION FUSION, CERVICAL 3-4, CERVICAL 4-5, CERVICAL 5-6, CERVICAL 6-7 WITH INSTRUMENTATION AND ALLOGRAFT   POSTERIOR LAMINECTOMY / DECOMPRESSION LUMBAR SPINE     removal of pain pump     x 2   Spinal Card Stimulator Removal      x 2   SPINAL CORD STIMULATOR INSERTION      x2    Family History  Problem Relation Age of Onset   Alpha-1 antitrypsin deficiency Father    Other Other     Allergies as of 07/15/2022 - Review Complete 07/15/2022  Allergen Reaction Noted   Hydrocodone Other (See Comments) and Nausea And Vomiting 07/31/2021   Adhesive [tape] Other (See Comments) 03/15/2015   Codeine Nausea And Vomiting 03/15/2015   Morphine and related  03/15/2015    Social History   Socioeconomic History   Marital status: Divorced    Spouse name: Not on file   Number of children: Not on file   Years of education: Not on file   Highest education level: Not on file  Occupational History   Not on file  Tobacco Use   Smoking status: Every Day    Packs/day: 0.50    Years: 35.00    Additional pack years: 0.00    Total pack years: 17.50    Types: Cigarettes    Last attempt to quit: 06/02/2016     Years since quitting: 6.1   Smokeless tobacco: Former   Tobacco comments:    used chewing tabacco as a child  Building services engineer Use: Never used  Substance and Sexual Activity   Alcohol use: Yes    Comment: occasional   Drug use: No   Sexual activity: Not on file  Other Topics Concern   Not on file  Social History Narrative   Not on file   Social Determinants of Health   Financial Resource Strain: Not on file  Food Insecurity: Not on file  Transportation Needs: Not on file  Physical Activity: Not on file  Stress: Not on file  Social Connections: Not on file  Intimate Partner Violence: Not on file     Review of Systems   Gen: Denies any fever, chills, fatigue, weight loss, lack of appetite.  CV: Denies chest pain, heart palpitations, peripheral edema, syncope.  Resp: Denies shortness of breath at rest or with exertion. Denies wheezing or cough.  GI: see HPI GU : Denies urinary burning, urinary frequency, urinary hesitancy MS: Denies joint pain, muscle weakness, cramps, or limitation of movement.  Derm: Denies rash, itching, dry skin Psych: Denies depression, anxiety, memory loss, and confusion Heme: Denies bruising, bleeding, and enlarged lymph nodes.   Physical Exam   BP 109/76   Pulse 79   Temp 98.1 F (36.7 C)   Ht 5\' 10"  (1.778 m)   Wt 164 lb 9.6 oz (74.7 kg)   BMI 23.62 kg/m   General:   Alert and oriented. Pleasant  and cooperative. Well-nourished and well-developed.  Head:  Normocephalic and atraumatic. Eyes:  Without icterus, sclera clear and conjunctiva pink.  Ears:  Normal auditory acuity. Mouth:  No deformity or lesions, oral mucosa pink.  Lungs:  Clear to auscultation bilaterally. No wheezes, rales, or rhonchi. No distress.  Heart:  S1, S2 present without murmurs appreciated.  Abdomen:  +BS, soft, non-tender and non-distended. No HSM noted. No guarding or rebound. No masses appreciated.  Rectal:  Deferred  Msk:  Symmetrical without gross  deformities. Normal posture. Extremities:  Without edema. Neurologic:  Alert and  oriented x4;  grossly normal neurologically. Skin:  Intact without significant lesions or rashes. Psych:  Alert and cooperative. Normal mood and affect.   Assessment   Jared Li is a 57 y.o. male with a history of asthma, arthritis, chronic pain, GERD, tobacco use presenting today for evaluation of left upper quadrant pain and recent history of upper GI bleed secondary to aspirin powder use.  History of upper GI bleed, peptic ulcer disease: Upper GI bleed in December 2023 that occurred while out of town in [place]. He reports history of staples placed in his stomach for what was described as a perforation. No longer using NSAIDs or aspirin powders. States sent home with PPI which he has continued to take daily. Denies any melena, brbpr, hematemesis. Advised to continue pantoprazole daily. In need of repeat EGD which he has agreed to. We will request prior hospitalization records.   Screening for colon cancer: Having lack of appetite and weight loss. Has never had a colonoscopy. Denied family history of colon cancer or colon polyps. Will schedule colonoscopy for colon cancer screening as well as evaluation of weight loss.   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 Has suffered from constipation for about 16-17 years. On average her has a BM every 2-3 days. Takes stool softeners or laxatives if he goes more than 10 days without a BM. Advised miralax and stool softeners if no BM in 5 days. Given Linzess to use for bowel prep for colonoscopy. Could consider long term prescription of this if effective, he currently is not interested in prescription therapy right now.   Weight loss, lack of appetite, nausea: Having nausea but denies vomiting. Reports a 34lb weight loss since December. Only eating once daily and has had no changes in medications. Has lack of appetite and at times food  aversion. Typically takes marinol for appetite and pain but has been having trouble getting it recently. Encouraged increase in protein intake and will check labs including CRP and BMP. Has upcoming CT scan ordered for weight loss evaluation. If kidney function stable could change to CTA.   PLAN   Proceed with upper endoscopy and colonoscopy with propofol by Dr. Marletta Lor in near future: the risks, benefits, and alternatives have been discussed with the patient in detail. The patient states understanding and desires to proceed. ASA 2 Trilyte prep Extra 1/2 day clears Linzess 145 mg daily for 4 days Continue pantoprazole 40 mg once daily. Avoid NSAIDs and ASA powders. Follow up CT A/P (without contrast given renal function) - scheduled 5/4. Will change to contrast if kidney function stable.  CRP and BMP Increase protein intake.  Continue colace and miralax as needed. Would take we are scheduling you for an upper endoscopy and if no BM in 5 days.  Request prior hospitalization records. Follow up in 6-8 weeks.    Brooke Bonito, MSN, FNP-BC, AGACNP-BC [facility]

## 2022-07-15 ENCOUNTER — Encounter: Payer: Self-pay | Admitting: Gastroenterology

## 2022-07-15 ENCOUNTER — Ambulatory Visit: Payer: Medicare HMO | Admitting: Gastroenterology

## 2022-07-15 VITALS — BP 109/76 | HR 79 | Temp 98.1°F | Ht 70.0 in | Wt 164.6 lb

## 2022-07-15 DIAGNOSIS — Z8719 Personal history of other diseases of the digestive system: Secondary | ICD-10-CM | POA: Diagnosis not present

## 2022-07-15 DIAGNOSIS — R112 Nausea with vomiting, unspecified: Secondary | ICD-10-CM | POA: Diagnosis not present

## 2022-07-15 DIAGNOSIS — K5903 Drug induced constipation: Secondary | ICD-10-CM | POA: Diagnosis not present

## 2022-07-15 DIAGNOSIS — R630 Anorexia: Secondary | ICD-10-CM

## 2022-07-15 DIAGNOSIS — Z1211 Encounter for screening for malignant neoplasm of colon: Secondary | ICD-10-CM

## 2022-07-15 DIAGNOSIS — R634 Abnormal weight loss: Secondary | ICD-10-CM

## 2022-07-15 DIAGNOSIS — Z8711 Personal history of peptic ulcer disease: Secondary | ICD-10-CM | POA: Diagnosis not present

## 2022-07-15 NOTE — Patient Instructions (Addendum)
We are scheduling you for an upper endoscopy and colonoscopy in the near future with Dr. Marletta Lor.  You received separate detailed written instructions regarding your prep  Continue taking pantoprazole 40 mg once daily. Continue to avoid any NSAIDs or aspirins.  Follow a GERD diet:  Avoid fried, fatty, greasy, spicy, citrus foods. Avoid caffeine and carbonated beverages. Avoid chocolate. Try eating 4-6 small meals a day rather than 3 large meals. Do not eat within 3 hours of laying down. Prop head of bed up on wood or bricks to create a 6 inch incline.   Please have blood work completed at [facility].  We will call you with results once they have been received. Please allow 3-5 business days for review. 2 locations for [facility] in [place]:              1. [address],               2. [address] Maisie Fus   I would recommend that if you do not have a bowel movement for more than 5 days that she should take MiraLAX and a stool softener rather than waiting until 10 days.  We will work on modifying your CT scan from without contrast with contrast pending the results of your kidney function.  To help maintain weight please increase your protein intake.  You may benefit from supplementing with boost or Ensure or other protein shake daily.  It was a pleasure to see you today. I want to create trusting relationships with patients. If you receive a survey regarding your visit,  I greatly appreciate you taking time to fill this out on paper or through your MyChart. I value your feedback.  Brooke Bonito, MSN, FNP-BC, AGACNP-BC [facility]

## 2022-07-16 ENCOUNTER — Telehealth: Payer: Self-pay | Admitting: *Deleted

## 2022-07-16 ENCOUNTER — Encounter: Payer: Self-pay | Admitting: Gastroenterology

## 2022-07-16 NOTE — Telephone Encounter (Signed)
LMOVM to call back to schedule TCS/EGD with Dr. Marletta Lor, ASA 2. Trilyte prep, extra 1/2 day clears, linzess 4 days prior

## 2022-07-20 ENCOUNTER — Ambulatory Visit (HOSPITAL_BASED_OUTPATIENT_CLINIC_OR_DEPARTMENT_OTHER): Payer: Medicare HMO | Attending: Family Medicine

## 2022-07-23 ENCOUNTER — Encounter: Payer: Self-pay | Admitting: *Deleted

## 2022-07-23 ENCOUNTER — Other Ambulatory Visit: Payer: Self-pay | Admitting: *Deleted

## 2022-07-23 MED ORDER — PEG 3350-KCL-NA BICARB-NACL 420 G PO SOLR: 4000.0000 mL | Freq: Once | ORAL | 0 refills | Status: AC

## 2022-07-23 NOTE — Telephone Encounter (Signed)
Pt has been scheduled for 08/15/22. Instructions sent to pt via MyChart and prep sent to the pharmacy

## 2022-07-26 ENCOUNTER — Ambulatory Visit: Payer: Medicare HMO | Admitting: Family Medicine

## 2022-08-13 ENCOUNTER — Encounter (HOSPITAL_COMMUNITY): Payer: Self-pay

## 2022-08-13 ENCOUNTER — Encounter (HOSPITAL_COMMUNITY)
Admission: RE | Admit: 2022-08-13 | Discharge: 2022-08-13 | Disposition: A | Discharge status: Home / Self Care | Payer: Medicare HMO | Source: Ambulatory Visit | Attending: Internal Medicine | Admitting: Internal Medicine

## 2022-08-13 HISTORY — DX: Sleep apnea, unspecified: G47.30

## 2022-08-15 ENCOUNTER — Encounter (HOSPITAL_COMMUNITY)
Admission: RE | Disposition: A | Discharge status: Home / Self Care | Payer: Self-pay | Source: Home / Self Care | Attending: Internal Medicine

## 2022-08-15 ENCOUNTER — Ambulatory Visit (HOSPITAL_COMMUNITY)
Admission: RE | Admit: 2022-08-15 | Discharge: 2022-08-15 | Disposition: A | Discharge status: Home / Self Care | Payer: Medicare HMO | Attending: Internal Medicine | Admitting: Internal Medicine

## 2022-08-15 ENCOUNTER — Ambulatory Visit (HOSPITAL_COMMUNITY): Payer: Medicare HMO | Admitting: Anesthesiology

## 2022-08-15 ENCOUNTER — Ambulatory Visit (HOSPITAL_BASED_OUTPATIENT_CLINIC_OR_DEPARTMENT_OTHER): Payer: Medicare HMO | Admitting: Anesthesiology

## 2022-08-15 ENCOUNTER — Encounter (HOSPITAL_COMMUNITY): Payer: Self-pay

## 2022-08-15 DIAGNOSIS — F17210 Nicotine dependence, cigarettes, uncomplicated: Secondary | ICD-10-CM | POA: Diagnosis not present

## 2022-08-15 DIAGNOSIS — Z1211 Encounter for screening for malignant neoplasm of colon: Secondary | ICD-10-CM | POA: Insufficient documentation

## 2022-08-15 DIAGNOSIS — G4730 Sleep apnea, unspecified: Secondary | ICD-10-CM | POA: Insufficient documentation

## 2022-08-15 DIAGNOSIS — K2970 Gastritis, unspecified, without bleeding: Secondary | ICD-10-CM

## 2022-08-15 DIAGNOSIS — Z8711 Personal history of peptic ulcer disease: Secondary | ICD-10-CM | POA: Diagnosis not present

## 2022-08-15 DIAGNOSIS — R12 Heartburn: Secondary | ICD-10-CM | POA: Insufficient documentation

## 2022-08-15 DIAGNOSIS — K317 Polyp of stomach and duodenum: Secondary | ICD-10-CM | POA: Diagnosis not present

## 2022-08-15 DIAGNOSIS — J45909 Unspecified asthma, uncomplicated: Secondary | ICD-10-CM

## 2022-08-15 DIAGNOSIS — K219 Gastro-esophageal reflux disease without esophagitis: Secondary | ICD-10-CM | POA: Diagnosis not present

## 2022-08-15 DIAGNOSIS — F32A Depression, unspecified: Secondary | ICD-10-CM | POA: Insufficient documentation

## 2022-08-15 DIAGNOSIS — K279 Peptic ulcer, site unspecified, unspecified as acute or chronic, without hemorrhage or perforation: Secondary | ICD-10-CM | POA: Diagnosis not present

## 2022-08-15 DIAGNOSIS — E785 Hyperlipidemia, unspecified: Secondary | ICD-10-CM | POA: Diagnosis not present

## 2022-08-15 DIAGNOSIS — Z8719 Personal history of other diseases of the digestive system: Secondary | ICD-10-CM | POA: Diagnosis not present

## 2022-08-15 HISTORY — PX: COLONOSCOPY WITH PROPOFOL: SHX5780

## 2022-08-15 HISTORY — PX: BIOPSY: SHX5522

## 2022-08-15 HISTORY — PX: ESOPHAGOGASTRODUODENOSCOPY (EGD) WITH PROPOFOL: SHX5813

## 2022-08-15 SURGERY — COLONOSCOPY WITH PROPOFOL
Anesthesia: General

## 2022-08-15 MED ORDER — PROPOFOL 10 MG/ML IV BOLUS
INTRAVENOUS | Status: DC | PRN
  Administered 2022-08-15: 50 mg via INTRAVENOUS
  Administered 2022-08-15: 100 mg via INTRAVENOUS
  Administered 2022-08-15: 30 mg via INTRAVENOUS
  Administered 2022-08-15: 50 mg via INTRAVENOUS

## 2022-08-15 MED ORDER — DEXMEDETOMIDINE HCL IN NACL 80 MCG/20ML IV SOLN
INTRAVENOUS | Status: DC | PRN
  Administered 2022-08-15: 8 ug via INTRAVENOUS
  Administered 2022-08-15: 12 ug via INTRAVENOUS

## 2022-08-15 MED ORDER — LIDOCAINE HCL (CARDIAC) PF 100 MG/5ML IV SOSY
PREFILLED_SYRINGE | INTRAVENOUS | Status: DC | PRN
  Administered 2022-08-15: 50 mg via INTRAVENOUS

## 2022-08-15 MED ORDER — LACTATED RINGERS IV SOLN: INTRAVENOUS | Status: DC | PRN

## 2022-08-15 MED ORDER — PHENYLEPHRINE 80 MCG/ML (10ML) SYRINGE FOR IV PUSH (FOR BLOOD PRESSURE SUPPORT)
PREFILLED_SYRINGE | INTRAVENOUS | Status: DC | PRN
  Administered 2022-08-15 (×2): 160 ug via INTRAVENOUS

## 2022-08-15 NOTE — Transfer of Care (Signed)
Immediate Anesthesia Transfer of Care Note  Patient: Jared Li  Procedure(s) Performed: COLONOSCOPY WITH PROPOFOL ESOPHAGOGASTRODUODENOSCOPY (EGD) WITH PROPOFOL BIOPSY  Patient Location: Short Stay  Anesthesia Type:General  Level of Consciousness: awake  Airway & Oxygen Therapy: Patient Spontanous Breathing  Post-op Assessment: Report given to RN and Post -op Vital signs reviewed and stable  Post vital signs: Reviewed and stable  Last Vitals:  Vitals Value Taken Time  BP    Temp    Pulse    Resp    SpO2      Last Pain:  Vitals:   08/15/22 1228  PainSc: 0-No pain         Complications: No notable events documented.

## 2022-08-15 NOTE — Op Note (Signed)
[facility] Patient Name: Jared Li Procedure Date: 08/15/2022 12:18 PM MRN: [id number] Date of Birth: 1965-08-30 Attending MD: Hennie Duos. Marletta Lor , [place], [id number] CSN: [id number] Age: 57 Admit Type: Outpatient Procedure:                Colonoscopy Indications:              Screening for colorectal malignant neoplasm Providers:                Hennie Duos. Marletta Lor, DO, Sheran Fava, Dyann Ruddle Referring MD:              Medicines:                See the Anesthesia note for documentation of the                            administered medications Complications:            No immediate complications. Estimated Blood Loss:     Estimated blood loss: none. Procedure:                Pre-Anesthesia Assessment:                           - The anesthesia plan was to use monitored                            anesthesia care (MAC).                           After obtaining informed consent, the colonoscope                            was passed under direct vision. Throughout the                            procedure, the patient's blood pressure, pulse, and                            oxygen saturations were monitored continuously. The                            [id number]-HQ190L ([id number]) scope was introduced through                            the anus and advanced to the the cecum, identified                            by appendiceal orifice and ileocecal valve. The                            colonoscopy was performed without difficulty. The                            patient tolerated the procedure well. The quality  of the bowel preparation was evaluated using the                            BBPS [facility] Bowel Preparation Scale) with scores                            of: Right Colon = 1 (portion of mucosa seen, but                            other areas not well seen due to staining, residual                            stool and/or  opaque liquid), Transverse Colon = 1                            (portion of mucosa seen, but other areas not well                            seen due to staining, residual stool and/or opaque                            liquid) and Left Colon = 2 (minor amount of                            residual staining, small fragments of stool and/or                            opaque liquid, but mucosa seen well). The total                            BBPS score equals 4. The quality of the bowel                            preparation was inadequate. Scope In: 12:39:24 PM Scope Out: 12:50:05 PM Scope Withdrawal Time: 0 hours 5 minutes 46 seconds  Total Procedure Duration: 0 hours 10 minutes 41 seconds  Findings:      Extensive amounts of semi-liquid stool was found in the transverse       colon, in the ascending colon and in the cecum, precluding       visualization. Lavage of the area was performed using copious amounts of       sterile water, resulting in incomplete clearance with continued poor       visualization. Impression:               - Preparation of the colon was inadequate.                           - Stool in the transverse colon, in the ascending                            colon and in the cecum.                           -  No specimens collected. Moderate Sedation:      Per Anesthesia Care Recommendation:           - Patient has a contact number available for                            emergencies. The signs and symptoms of potential                            delayed complications were discussed with the                            patient. Return to normal activities tomorrow.                            Written discharge instructions were provided to the                            patient.                           - Resume previous diet.                           - Continue present medications.                           - Repeat colonoscopy in 6 months because the bowel                             preparation was poor.                           - Return to GI clinic in 6 weeks. Procedure Code(s):        --- Professional ---                           Z6109, Colorectal cancer screening; colonoscopy on                            individual not meeting criteria for high risk Diagnosis Code(s):        --- Professional ---                           Z12.11, Encounter for screening for malignant                            neoplasm of colon CPT copyright 2022 American Medical Association. All rights reserved. The codes documented in this report are preliminary and upon coder review may  be revised to meet current compliance requirements. Hennie Duos. Marletta Lor, DO Hennie Duos. Marletta Lor, DO 08/15/2022 12:52:57 PM This report has been signed electronically. Number of Addenda: 0

## 2022-08-15 NOTE — Anesthesia Preprocedure Evaluation (Signed)
Anesthesia Evaluation  Patient identified by MRN, date of birth, ID band Patient awake    Reviewed: Allergy & Precautions, H&P , NPO status , Patient's Chart, lab work & pertinent test results, reviewed documented beta blocker date and time   Airway Mallampati: II  TM Distance: >3 FB Neck ROM: full    Dental no notable dental hx.    Pulmonary neg pulmonary ROS, asthma , sleep apnea , Current Smoker   Pulmonary exam normal breath sounds clear to auscultation       Cardiovascular Exercise Tolerance: Good negative cardio ROS  Rhythm:regular Rate:Normal     Neuro/Psych  Headaches PSYCHIATRIC DISORDERS  Depression     Neuromuscular disease negative neurological ROS  negative psych ROS   GI/Hepatic negative GI ROS, Neg liver ROS, PUD,GERD  ,,  Endo/Other  negative endocrine ROS    Renal/GU negative Renal ROS  negative genitourinary   Musculoskeletal   Abdominal   Peds  Hematology negative hematology ROS (+)   Anesthesia Other Findings   Reproductive/Obstetrics negative OB ROS                             Anesthesia Physical Anesthesia Plan  ASA: 3  Anesthesia Plan: General   Post-op Pain Management:    Induction:   PONV Risk Score and Plan: Propofol infusion  Airway Management Planned:   Additional Equipment:   Intra-op Plan:   Post-operative Plan:   Informed Consent: I have reviewed the patients History and Physical, chart, labs and discussed the procedure including the risks, benefits and alternatives for the proposed anesthesia with the patient or authorized representative who has indicated his/her understanding and acceptance.     Dental Advisory Given  Plan Discussed with: CRNA  Anesthesia Plan Comments:        Anesthesia Quick Evaluation

## 2022-08-15 NOTE — Op Note (Signed)
[facility] Patient Name: Jared Li Procedure Date: 08/15/2022 12:19 PM MRN: [id number] Date of Birth: [birth year] Attending MD: Hennie Duos. Marletta Lor , [place], [id number] CSN: [id number] Age: 57 Admit Type: Outpatient Procedure:                Upper GI endoscopy Indications:              Heartburn, Follow-up of peptic ulcer Providers:                Hennie Duos. Marletta Lor, DO, Sheran Fava, Dyann Ruddle Referring MD:              Medicines:                See the Anesthesia note for documentation of the                            administered medications Complications:            No immediate complications. Estimated Blood Loss:     Estimated blood loss was minimal. Procedure:                Pre-Anesthesia Assessment:                           - The anesthesia plan was to use monitored                            anesthesia care (MAC).                           After obtaining informed consent, the endoscope was                            passed under direct vision. Throughout the                            procedure, the patient's blood pressure, pulse, and                            oxygen saturations were monitored continuously. The                            [id number]-H190 ([id number]) scope was introduced through the                            mouth, and advanced to the second part of duodenum.                            The upper GI endoscopy was accomplished without                            difficulty. The patient tolerated the procedure                            well. Scope In: 12:32:46  PM Scope Out: 12:35:45 PM Total Procedure Duration: 0 hours 2 minutes 59 seconds  Findings:      The Z-line was regular and was found 41 cm from the incisors.      Patchy mild inflammation characterized by erythema was found in the       gastric antrum and in the stomach. Biopsies were taken with a cold       forceps for Helicobacter pylori testing.      The  duodenal bulb, first portion of the duodenum and second portion of       the duodenum were normal. Impression:               - Z-line regular, 41 cm from the incisors.                           - Gastritis. Biopsied.                           - Normal duodenal bulb, first portion of the                            duodenum and second portion of the duodenum. Moderate Sedation:      Per Anesthesia Care Recommendation:           - Patient has a contact number available for                            emergencies. The signs and symptoms of potential                            delayed complications were discussed with the                            patient. Return to normal activities tomorrow.                            Written discharge instructions were provided to the                            patient.                           - Resume previous diet.                           - Continue present medications.                           - Await pathology results.                           - Use Protonix (pantoprazole) 40 mg PO daily.                           - Return to GI clinic in 6 weeks. Procedure Code(s):        --- Professional ---  16109, Esophagogastroduodenoscopy, flexible,                            transoral; with biopsy, single or multiple Diagnosis Code(s):        --- Professional ---                           K29.70, Gastritis, unspecified, without bleeding                           R12, Heartburn                           K27.9, Peptic ulcer, site unspecified, unspecified                            as acute or chronic, without hemorrhage or                            perforation CPT copyright 2022 American Medical Association. All rights reserved. The codes documented in this report are preliminary and upon coder review may  be revised to meet current compliance requirements. Hennie Duos. Marletta Lor, DO Hennie Duos. Marletta Lor, DO 08/15/2022 12:38:14 PM This  report has been signed electronically. Number of Addenda: 0

## 2022-08-15 NOTE — H&P (Signed)
Primary Care Physician:  Tommie Sams, DO Primary Gastroenterologist:  Dr. Marletta Lor  Pre-Procedure History & Physical: HPI:  Jared Li is a 57 y.o. male is here for an EGD for follow up of perforated ulcer and a colonoscopy to be performed for colon cancer screening purposes.  Past Medical History:  Diagnosis Date   Adjustment disorder    Arthritis    Asthma    Cauda equina syndrome (HCC)    Cellulitis of lower limb    Cervical radiculopathy    Chronic intractable headache    Mirgraines- lst one 06/02/16   Chronic pain    Edema    GERD (gastroesophageal reflux disease)    History of kidney stones    passed   Insomnia    Loose stools    Lumbar post-laminectomy syndrome    MRSA bacteremia    Open wound of lumbar region with complication    Physical deconditioning    Sleep apnea    Tobacco use disorder     Past Surgical History:  Procedure Laterality Date   APPLICATION OF A-CELL OF HEAD/NECK N/A 06/13/2016   Procedure: neck closure AFTER SPINE SURGERY WITH placement of wound vac.;  Surgeon: Alena Bills Dillingham, DO;  Location: [facility] OR;  Service: Plastics;  Laterality: N/A;   CARPAL TUNNEL RELEASE Bilateral    CERVICAL FUSION     Numerous   LUMBAR FUSION     Numerous   Pain Pump insertion     x 2   POSTERIOR CERVICAL FUSION/FORAMINOTOMY N/A 06/13/2016   Procedure: POSTERIOR CERVICAL DECOMPRESSION FUSION, CERVICAL 3-4, CERVICAL 4-5, CERVICAL 5-6, CERVICAL 6-7 WITH INSTRUMENTATION AND ALLOGRAFT;  Surgeon: Estill Bamberg, MD;  Location: [facility] OR;  Service: Orthopedics;  Laterality: N/A;  POSTERIOR CERVICAL DECOMPRESSION FUSION, CERVICAL 3-4, CERVICAL 4-5, CERVICAL 5-6, CERVICAL 6-7 WITH INSTRUMENTATION AND ALLOGRAFT   POSTERIOR LAMINECTOMY / DECOMPRESSION LUMBAR SPINE     removal of pain pump     x 2   Spinal Card Stimulator Removal      x 2   SPINAL CORD STIMULATOR INSERTION      x2   STOMACH SURGERY  02/2022    Prior to Admission medications   Medication Sig Start  Date End Date Taking? Authorizing Provider  albuterol (VENTOLIN HFA) 108 (90 Base) MCG/ACT inhaler TAKE 2 PUFFS BY MOUTH EVERY 6 HOURS AS NEEDED FOR WHEEZE OR SHORTNESS OF BREATH 07/03/22  Yes Cook, Jayce G, DO  atorvastatin (LIPITOR) 40 MG tablet Take 1 tablet (40 mg total) by mouth daily. 07/03/22  Yes Cook, Jayce G, DO  buPROPion (WELLBUTRIN XL) 150 MG 24 hr tablet Take 1 tablet (150 mg total) by mouth daily. Patient taking differently: Take 150 mg by mouth in the morning and at bedtime. 07/03/22  Yes Tommie Sams, DO  Cholecalciferol (VITAMIN D3) 50 MCG (2000 UT) capsule Take 2,000 Units by mouth daily. 11/28/21  Yes Cook, Jayce G, DO  Coenzyme Q10 (COQ10) 200 MG CAPS Take 200 mg by mouth daily.   Yes [provider]  diphenhydrAMINE (BENADRYL) 25 MG tablet Take 25 mg by mouth every 6 (six) hours as needed for itching.    Yes [provider]  dronabinol (MARINOL) 5 MG capsule Take one capsule by mouth twice daily before meals 04/14/15  Yes Kirt Boys, DO  DULoxetine (CYMBALTA) 30 MG capsule Take 30 mg by mouth daily. 06/18/22  Yes [provider]  Flaxseed, Linseed, (FLAXSEED OIL) 1000 MG CAPS Take 1,000 mg by mouth daily.  Yes [provider]  GRALISE 600 MG TABS Take 3,000 mg by mouth daily with supper. Takes 5 tablets at dinner 05/04/16  Yes [provider]  LORazepam (ATIVAN) 0.5 MG tablet Take 0.5 mg by mouth 2 (two) times daily as needed. 01/04/20  Yes [provider]  Multiple Vitamin (MULTIVITAMIN WITH MINERALS) TABS tablet Take 1 tablet by mouth daily.   Yes [provider]  Omega-3 Fatty Acids (FISH OIL) 1000 MG CAPS Take 1,000 mg by mouth daily. 11/28/21  Yes Cook, Jayce G, DO  ondansetron (ZOFRAN) 4 MG tablet Take 4 mg by mouth every 8 (eight) hours as needed for nausea or vomiting. 04/23/22  Yes [provider]  oxyCODONE (ROXICODONE) 15 MG immediate release tablet Take 15 mg by mouth every 4 (four) hours as needed  for pain.   Yes [provider]  pantoprazole (PROTONIX) 40 MG tablet Take 40 mg by mouth daily. 03/20/22  Yes [provider]  rizatriptan (MAXALT) 10 MG tablet Take 10 mg by mouth every 2 (two) hours as needed for migraine. May repeat in 2 hours if needed    Yes [provider]  Simethicone (GAS-X PO) Take 2 tablets by mouth 2 (two) times daily as needed (gas).   Yes [provider]  Thiamine HCl (VITAMIN B-1) 250 MG tablet Take 250 mg by mouth daily.   Yes [provider]  vitamin C (ASCORBIC ACID) 500 MG tablet Take 500 mg by mouth 2 (two) times daily.   Yes [provider]  Nutritional Supplements (ENSURE HIGH PROTEIN PO) Take 1 Can by mouth 4 (four) times daily.    [provider]  ondansetron (ZOFRAN-ODT) 4 MG disintegrating tablet Take 1-2 tablets (4-8 mg total) by mouth every 8 (eight) hours as needed for nausea, vomiting or refractory nausea / vomiting. 01/02/22   Tommie Sams, DO  Testosterone 12.5 MG/ACT (1%) GEL Apply 4 Pump topically daily. 06/24/21   [provider]    Allergies as of 07/23/2022 - Review Complete 07/15/2022  Allergen Reaction Noted   Hydrocodone Other (See Comments) and Nausea And Vomiting 07/31/2021   Adhesive [tape] Other (See Comments) 03/15/2015   Codeine Nausea And Vomiting 03/15/2015   Morphine and codeine  03/15/2015    Family History  Problem Relation Age of Onset   Alpha-1 antitrypsin deficiency Father    Other Other     Social History   Socioeconomic History   Marital status: Divorced    Spouse name: Not on file   Number of children: Not on file   Years of education: Not on file   Highest education level: Not on file  Occupational History   Not on file  Tobacco Use   Smoking status: Every Day    Packs/day: 0.50    Years: 35.00    Additional pack years: 0.00    Total pack years: 17.50    Types: Cigarettes    Last attempt to quit: 06/02/2016    Years since quitting:  6.2   Smokeless tobacco: Former   Tobacco comments:    used chewing tabacco as a child  Building services engineer Use: Never used  Substance and Sexual Activity   Alcohol use: Yes    Comment: occasional   Drug use: No   Sexual activity: Not on file  Other Topics Concern   Not on file  Social History Narrative   Not on file   Social Determinants of Corporate investment banker  Strain: Not on file  Food Insecurity: Not on file  Transportation Needs: Not on file  Physical Activity: Not on file  Stress: Not on file  Social Connections: Not on file  Intimate Partner Violence: Not on file    Review of Systems: See HPI, otherwise negative ROS  Physical Exam: Vital signs in last 24 hours:     General:   Alert,  Well-developed, well-nourished, pleasant and cooperative in NAD Head:  Normocephalic and atraumatic. Eyes:  Sclera clear, no icterus.   Conjunctiva pink. Ears:  Normal auditory acuity. Nose:  No deformity, discharge,  or lesions. Msk:  Symmetrical without gross deformities. Normal posture. Extremities:  Without clubbing or edema. Neurologic:  Alert and  oriented x4;  grossly normal neurologically. Skin:  Intact without significant lesions or rashes. Psych:  Alert and cooperative. Normal mood and affect.  Impression/Plan: Jared Li is here for an EGD for follow up of perforated ulcer and a colonoscopy to be performed for colon cancer screening purposes.  The risks of the procedure including infection, bleed, or perforation as well as benefits, limitations, alternatives and imponderables have been reviewed with the patient. Questions have been answered. All parties agreeable.

## 2022-08-15 NOTE — Discharge Instructions (Addendum)
EGD Discharge instructions Please read the instructions outlined below and refer to this sheet in the next few weeks. These discharge instructions provide you with general information on caring for yourself after you leave the hospital. Your doctor may also give you specific instructions. While your treatment has been planned according to the most current medical practices available, unavoidable complications occasionally occur. If you have any problems or questions after discharge, please call your doctor. ACTIVITY You may resume your regular activity but move at a slower pace for the next 24 hours.  Take frequent rest periods for the next 24 hours.  Walking will help expel (get rid of) the air and reduce the bloated feeling in your abdomen.  No driving for 24 hours (because of the anesthesia (medicine) used during the test).  You may shower.  Do not sign any important legal documents or operate any machinery for 24 hours (because of the anesthesia used during the test).  NUTRITION Drink plenty of fluids.  You may resume your normal diet.  Begin with a light meal and progress to your normal diet.  Avoid alcoholic beverages for 24 hours or as instructed by your caregiver.  MEDICATIONS You may resume your normal medications unless your caregiver tells you otherwise.  WHAT YOU CAN EXPECT TODAY You may experience abdominal discomfort such as a feeling of fullness or "gas" pains.  FOLLOW-UP Your doctor will discuss the results of your test with you.  SEEK IMMEDIATE MEDICAL ATTENTION IF ANY OF THE FOLLOWING OCCUR: Excessive nausea (feeling sick to your stomach) and/or vomiting.  Severe abdominal pain and distention (swelling).  Trouble swallowing.  Temperature over 101 F (37.8 C).  Rectal bleeding or vomiting of blood.   Your EGD revealed mild amount inflammation in your stomach.  I took biopsies of this to rule out infection with a bacteria called H. pylori.  Await pathology results, my  office will contact you.  Esophagus and small bowel appeared normal.  Continue on Pantoprazole.   Unfortunately, your colon was not adequately prepped today for colonoscopy.  I did not see any evidence of colon cancer or large polyps, but certainly could have missed smaller polyps due to poor visualization.  Would recommend repeat colonoscopy in 6 months with a different colon prep.  Follow up in GI office in 6 weeks.     I hope you have a great rest of your week!  Hennie Duos. Marletta Lor, D.O. Gastroenterology and Hepatology [facility]

## 2022-08-15 NOTE — Anesthesia Procedure Notes (Signed)
Date/Time: 08/15/2022 12:40 PM  Performed by: Julian Reil, CRNAPre-anesthesia Checklist: Patient identified, Emergency Drugs available, Suction available and Patient being monitored Patient Re-evaluated:Patient Re-evaluated prior to induction Oxygen Delivery Method: Simple face mask Induction Type: IV induction Placement Confirmation: positive ETCO2

## 2022-08-16 LAB — SURGICAL PATHOLOGY

## 2022-08-20 ENCOUNTER — Encounter (HOSPITAL_COMMUNITY): Payer: Self-pay | Admitting: Internal Medicine

## 2022-08-21 NOTE — Anesthesia Postprocedure Evaluation (Signed)
Anesthesia Post Note  Patient: Jared Li  Procedure(s) Performed: COLONOSCOPY WITH PROPOFOL ESOPHAGOGASTRODUODENOSCOPY (EGD) WITH PROPOFOL BIOPSY  Patient location during evaluation: Phase II Anesthesia Type: General Level of consciousness: awake Pain management: pain level controlled Vital Signs Assessment: post-procedure vital signs reviewed and stable Respiratory status: spontaneous breathing and respiratory function stable Cardiovascular status: blood pressure returned to baseline and stable Postop Assessment: no headache and no apparent nausea or vomiting Anesthetic complications: no Comments: Late entry   No notable events documented.   Last Vitals:  Vitals:   08/15/22 1137 08/15/22 1253  BP: 104/83 (!) 152/72  Pulse: 64 (!) 50  Resp: 12 17  Temp: 36.8 C 36.7 C  SpO2: 95% 98%    Last Pain:  Vitals:   08/15/22 1228  PainSc: 0-No pain                 Windell Norfolk

## 2022-09-16 ENCOUNTER — Other Ambulatory Visit: Payer: Self-pay | Admitting: Internal Medicine

## 2022-09-16 ENCOUNTER — Other Ambulatory Visit: Payer: Self-pay | Admitting: Medical

## 2022-10-03 ENCOUNTER — Other Ambulatory Visit: Payer: Self-pay | Admitting: Family Medicine

## 2022-10-03 DIAGNOSIS — F321 Major depressive disorder, single episode, moderate: Secondary | ICD-10-CM

## 2022-10-13 ENCOUNTER — Other Ambulatory Visit: Payer: Self-pay | Admitting: Family Medicine

## 2022-11-05 DIAGNOSIS — F1290 Cannabis use, unspecified, uncomplicated: Secondary | ICD-10-CM | POA: Diagnosis not present

## 2022-11-05 DIAGNOSIS — E870 Hyperosmolality and hypernatremia: Secondary | ICD-10-CM | POA: Diagnosis not present

## 2022-11-05 DIAGNOSIS — N1831 Chronic kidney disease, stage 3a: Secondary | ICD-10-CM | POA: Diagnosis not present

## 2022-11-05 DIAGNOSIS — G4733 Obstructive sleep apnea (adult) (pediatric): Secondary | ICD-10-CM | POA: Diagnosis not present

## 2022-11-08 ENCOUNTER — Ambulatory Visit: Payer: Medicare HMO

## 2022-11-10 ENCOUNTER — Other Ambulatory Visit: Payer: Self-pay | Admitting: Family Medicine

## 2022-11-12 ENCOUNTER — Other Ambulatory Visit (HOSPITAL_COMMUNITY): Payer: Self-pay | Admitting: Nephrology

## 2022-11-12 DIAGNOSIS — N1831 Chronic kidney disease, stage 3a: Secondary | ICD-10-CM

## 2022-11-27 ENCOUNTER — Other Ambulatory Visit: Payer: Self-pay | Admitting: Family Medicine

## 2022-12-13 ENCOUNTER — Other Ambulatory Visit: Payer: Self-pay | Admitting: Family Medicine

## 2023-01-02 ENCOUNTER — Ambulatory Visit: Payer: Medicare HMO | Admitting: Family Medicine

## 2023-01-14 ENCOUNTER — Ambulatory Visit: Payer: Medicare HMO | Admitting: Family Medicine

## 2023-01-23 ENCOUNTER — Ambulatory Visit (INDEPENDENT_AMBULATORY_CARE_PROVIDER_SITE_OTHER): Payer: Medicare HMO | Admitting: Family Medicine

## 2023-01-23 VITALS — BP 86/58 | HR 79 | Temp 97.9°F | Wt 166.0 lb

## 2023-01-23 DIAGNOSIS — R490 Dysphonia: Secondary | ICD-10-CM

## 2023-01-23 DIAGNOSIS — Z23 Encounter for immunization: Secondary | ICD-10-CM

## 2023-01-23 DIAGNOSIS — F321 Major depressive disorder, single episode, moderate: Secondary | ICD-10-CM | POA: Diagnosis not present

## 2023-01-23 MED ORDER — BUPROPION HCL ER (XL) 300 MG PO TB24
300.0000 mg | ORAL_TABLET | Freq: Every day | ORAL | 3 refills | Status: AC
Start: 2023-01-23 — End: ?

## 2023-01-23 NOTE — Patient Instructions (Signed)
Medication as prescribed.  Referral to ENT placed.  Follow-up in 3-6 months.

## 2023-01-24 ENCOUNTER — Encounter (INDEPENDENT_AMBULATORY_CARE_PROVIDER_SITE_OTHER): Payer: Self-pay | Admitting: Otolaryngology

## 2023-01-24 DIAGNOSIS — R490 Dysphonia: Secondary | ICD-10-CM | POA: Insufficient documentation

## 2023-01-24 NOTE — Assessment & Plan Note (Signed)
Given persistence and the fact that the patient smokes, needs direct visualization of the vocal cords.  Referring to ENT.

## 2023-01-24 NOTE — Assessment & Plan Note (Signed)
Stable. Increased dose of Wellbutrin sent.

## 2023-01-24 NOTE — Progress Notes (Signed)
Subjective:  Patient ID: Jared Li, male    DOB: Feb 17, 1966  Age: 57 y.o. MRN: [id number]  CC:   Chief Complaint  Patient presents with   Laryngitis    Since November 01, 2024when friend passed away, denies any other related symptoms   discuss depression medication- rewrite    To 300 mg daily     HPI:  57 year old male with the below mentioned medical problems presents for follow-up.  Patient reports ongoing laryngitis.  He states that this has been going on since Jan 17, 2023.  He is a smoker.  Has not improved with supportive care and voice rest.  Patient also reports that depression seems to be better since the increase in Wellbutrin to 300 mg.  He needs a new prescription.  Patient Active Problem List   Diagnosis Date Noted   Chronic hoarseness 01/24/2023   PUD (peptic ulcer disease) 07/05/2022   Depression, major, single episode, moderate (HCC) 07/05/2022   GERD (gastroesophageal reflux disease) 11/14/2021   HLD (hyperlipidemia) 11/14/2021   Syncope 11/14/2021   Chronic pain syndrome 11/13/2021   Migraine headache 11/13/2021   Opiate dependence (HCC) 12/06/2020   Hypogonadism male 01/18/2018    Social Hx   Social History   Socioeconomic History   Marital status: Divorced    Spouse name: Not on file   Number of children: Not on file   Years of education: Not on file   Highest education level: Associate degree: occupational, Scientist, product/process development, or vocational program  Occupational History   Not on file  Tobacco Use   Smoking status: Every Day    Current packs/day: 0.00    Average packs/day: 0.5 packs/day for 35.0 years (17.5 ttl pk-yrs)    Types: Cigarettes    Start date: 06/02/1981    Last attempt to quit: 06/02/2016    Years since quitting: 6.6   Smokeless tobacco: Former   Tobacco comments:    used chewing tabacco as a child  Advertising account planner   Vaping status: Never Used  Substance and Sexual Activity   Alcohol use: Yes    Comment: occasional   Drug use: No   Sexual  activity: Not on file  Other Topics Concern   Not on file  Social History Narrative   Not on file   Social Determinants of Health   Financial Resource Strain: Medium Risk (01/22/2023)   Overall Financial Resource Strain (CARDIA)    Difficulty of Paying Living Expenses: Somewhat hard  Food Insecurity: Food Insecurity Present (01/22/2023)   Hunger Vital Sign    Worried About Running Out of Food in the Last Year: Sometimes true    Ran Out of Food in the Last Year: Often true  Transportation Needs: Unmet Transportation Needs (01/22/2023)   PRAPARE - Transportation    Lack of Transportation (Medical): Yes    Lack of Transportation (Non-Medical): Yes  Physical Activity: Sufficiently Active (01/22/2023)   Exercise Vital Sign    Days of Exercise per Week: 7 days    Minutes of Exercise per Session: 60 min  Stress: Stress Concern Present (01/22/2023)   [employer] of Occupational Health - Occupational Stress Questionnaire    Feeling of Stress : Very much  Social Connections: Socially Isolated (01/22/2023)   Social Connection and Isolation Panel [NHANES]    Frequency of Communication with Friends and Family: Three times a week    Frequency of Social Gatherings with Friends and Family: Never    Attends Religious Services: Never    Active Member of  Clubs or Organizations: No    Attends Engineer, structural: Not on file    Marital Status: Divorced    Review of Systems Per HPI  Objective:  BP (!) 86/58   Pulse 79   Temp 97.9 F (36.6 C)   Wt 166 lb (75.3 kg)   SpO2 97%   BMI 23.82 kg/m      01/23/2023    2:10 PM 08/15/2022   12:53 PM 08/15/2022   11:37 AM  BP/Weight  Systolic BP 86 152 104  Diastolic BP 58 72 83  Wt. (Lbs) 166    BMI 23.82 kg/m2      Physical Exam Constitutional:      General: He is not in acute distress. HENT:     Head: Normocephalic and atraumatic.  Cardiovascular:     Rate and Rhythm: Normal rate and regular rhythm.  Pulmonary:      Effort: Pulmonary effort is normal.     Breath sounds: Normal breath sounds.  Neurological:     Mental Status: He is alert.  Psychiatric:     Comments: Flat affect.     Lab Results  Component Value Date   WBC 7.6 07/03/2022   HGB 14.8 07/03/2022   HCT 45.3 07/03/2022   PLT 205 07/03/2022   GLUCOSE 114 (H) 07/03/2022   CHOL 116 07/03/2022   TRIG 74 07/03/2022   HDL 44 07/03/2022   LDLCALC 57 07/03/2022   ALT 10 07/03/2022   AST 18 07/03/2022   NA 146 (H) 07/03/2022   K 4.3 07/03/2022   CL 105 07/03/2022   CREATININE 1.36 (H) 07/03/2022   BUN 8 07/03/2022   CO2 26 07/03/2022   TSH 3.020 11/26/2021   INR 1.3 (H) 12/06/2020     Assessment & Plan:   Problem List Items Addressed This Visit       Other   Depression, major, single episode, moderate (HCC)    Stable. Increased dose of Wellbutrin sent.      Relevant Medications   buPROPion (WELLBUTRIN XL) 300 MG 24 hr tablet   Chronic hoarseness - Primary    Given persistence and the fact that the patient smokes, needs direct visualization of the vocal cords.  Referring to ENT.      Relevant Orders   Ambulatory referral to ENT   Other Visit Diagnoses     Immunization due           Meds ordered this encounter  Medications   buPROPion (WELLBUTRIN XL) 300 MG 24 hr tablet    Sig: Take 1 tablet (300 mg total) by mouth daily.    Dispense:  90 tablet    Refill:  3    Follow-up:  6 months  Vinessa Macconnell Adriana Simas DO [facility]

## 2023-02-02 DIAGNOSIS — S61412A Laceration without foreign body of left hand, initial encounter: Secondary | ICD-10-CM | POA: Diagnosis not present

## 2023-02-02 DIAGNOSIS — S61459A Open bite of unspecified hand, initial encounter: Secondary | ICD-10-CM | POA: Diagnosis not present

## 2023-02-02 DIAGNOSIS — S6292XA Unspecified fracture of left wrist and hand, initial encounter for closed fracture: Secondary | ICD-10-CM | POA: Diagnosis not present

## 2023-02-12 ENCOUNTER — Other Ambulatory Visit: Payer: Self-pay | Admitting: Family Medicine

## 2023-02-14 ENCOUNTER — Other Ambulatory Visit (HOSPITAL_COMMUNITY)
Admission: RE | Admit: 2023-02-14 | Discharge: 2023-02-14 | Disposition: A | Discharge status: Home / Self Care | Payer: Medicare HMO | Source: Other Acute Inpatient Hospital | Attending: Nephrology | Admitting: Nephrology

## 2023-02-14 DIAGNOSIS — N289 Disorder of kidney and ureter, unspecified: Secondary | ICD-10-CM | POA: Diagnosis not present

## 2023-02-14 LAB — BASIC METABOLIC PANEL
Anion gap: 8 (ref 5–15)
BUN: 14 mg/dL (ref 6–20)
CO2: 25 mmol/L (ref 22–32)
Calcium: 8.9 mg/dL (ref 8.9–10.3)
Chloride: 105 mmol/L (ref 98–111)
Creatinine, Ser: 1.21 mg/dL (ref 0.61–1.24)
GFR, Estimated: >60 mL/min (ref >60)
Glucose, Bld: 103 mg/dL — ABNORMAL HIGH (ref 70–99)
Potassium: 4.3 mmol/L (ref 3.5–5.1)
Sodium: 138 mmol/L (ref 135–145)

## 2023-02-14 LAB — C-REACTIVE PROTEIN: CRP: 0.7 mg/dL (ref <1.0)

## 2023-02-15 DIAGNOSIS — R351 Nocturia: Secondary | ICD-10-CM | POA: Diagnosis not present

## 2023-02-15 DIAGNOSIS — E870 Hyperosmolality and hypernatremia: Secondary | ICD-10-CM | POA: Diagnosis not present

## 2023-02-15 DIAGNOSIS — N182 Chronic kidney disease, stage 2 (mild): Secondary | ICD-10-CM | POA: Diagnosis not present

## 2023-02-15 DIAGNOSIS — G4733 Obstructive sleep apnea (adult) (pediatric): Secondary | ICD-10-CM | POA: Diagnosis not present

## 2023-03-04 ENCOUNTER — Other Ambulatory Visit (HOSPITAL_COMMUNITY): Payer: Self-pay | Admitting: Nephrology

## 2023-03-04 DIAGNOSIS — N1831 Chronic kidney disease, stage 3a: Secondary | ICD-10-CM

## 2023-03-04 DIAGNOSIS — E870 Hyperosmolality and hypernatremia: Secondary | ICD-10-CM

## 2023-03-05 ENCOUNTER — Encounter: Payer: Self-pay | Admitting: Internal Medicine

## 2023-03-05 ENCOUNTER — Telehealth: Payer: Self-pay | Admitting: Internal Medicine

## 2023-03-05 NOTE — Telephone Encounter (Signed)
Pt had colonoscopy by Dr Marletta Lor on 08/15/2022 and recommended a repeat in 6 months. Does he need OV prior or can he be scheduled procedure without OV?

## 2023-03-05 NOTE — Telephone Encounter (Signed)
yes

## 2023-03-07 ENCOUNTER — Other Ambulatory Visit: Payer: Self-pay | Admitting: Family Medicine

## 2023-05-02 ENCOUNTER — Other Ambulatory Visit: Payer: Self-pay | Admitting: Family Medicine

## 2023-05-02 DIAGNOSIS — F321 Major depressive disorder, single episode, moderate: Secondary | ICD-10-CM

## 2023-05-28 ENCOUNTER — Other Ambulatory Visit: Payer: Self-pay | Admitting: Family Medicine

## 2023-07-14 ENCOUNTER — Other Ambulatory Visit (HOSPITAL_COMMUNITY)
Admission: RE | Admit: 2023-07-14 | Discharge: 2023-07-14 | Disposition: A | Discharge status: Home / Self Care | Source: Ambulatory Visit | Attending: Nephrology | Admitting: Nephrology

## 2023-07-14 DIAGNOSIS — I10 Essential (primary) hypertension: Secondary | ICD-10-CM | POA: Diagnosis present

## 2023-07-14 DIAGNOSIS — R809 Proteinuria, unspecified: Secondary | ICD-10-CM | POA: Diagnosis not present

## 2023-07-14 DIAGNOSIS — I129 Hypertensive chronic kidney disease with stage 1 through stage 4 chronic kidney disease, or unspecified chronic kidney disease: Secondary | ICD-10-CM | POA: Diagnosis present

## 2023-07-14 DIAGNOSIS — D631 Anemia in chronic kidney disease: Secondary | ICD-10-CM | POA: Diagnosis not present

## 2023-07-14 DIAGNOSIS — E559 Vitamin D deficiency, unspecified: Secondary | ICD-10-CM | POA: Insufficient documentation

## 2023-07-14 DIAGNOSIS — E1122 Type 2 diabetes mellitus with diabetic chronic kidney disease: Secondary | ICD-10-CM | POA: Diagnosis present

## 2023-07-14 DIAGNOSIS — N189 Chronic kidney disease, unspecified: Secondary | ICD-10-CM | POA: Diagnosis not present

## 2023-07-14 DIAGNOSIS — N1831 Chronic kidney disease, stage 3a: Secondary | ICD-10-CM | POA: Diagnosis present

## 2023-07-14 LAB — CBC
HCT: 43.5 % (ref 39.0–52.0)
Hemoglobin: 14.5 g/dL (ref 13.0–17.0)
MCH: 33.9 pg (ref 26.0–34.0)
MCHC: 33.3 g/dL (ref 30.0–36.0)
MCV: 101.6 fL — ABNORMAL HIGH (ref 80.0–100.0)
Platelets: 214 10*3/uL (ref 150–400)
RBC: 4.28 MIL/uL (ref 4.22–5.81)
RDW: 13.6 % (ref 11.5–15.5)
WBC: 6.8 10*3/uL (ref 4.0–10.5)
nRBC: 0 % (ref 0.0–0.2)

## 2023-07-14 LAB — RENAL FUNCTION PANEL
Albumin: 4.5 g/dL (ref 3.5–5.0)
Anion gap: 10 (ref 5–15)
BUN: 13 mg/dL (ref 6–20)
CO2: 27 mmol/L (ref 22–32)
Calcium: 9.3 mg/dL (ref 8.9–10.3)
Chloride: 99 mmol/L (ref 98–111)
Creatinine, Ser: 1.07 mg/dL (ref 0.61–1.24)
GFR, Estimated: >60 mL/min (ref >60)
Glucose, Bld: 107 mg/dL — ABNORMAL HIGH (ref 70–99)
Phosphorus: 2.4 mg/dL — ABNORMAL LOW (ref 2.5–4.6)
Potassium: 4 mmol/L (ref 3.5–5.1)
Sodium: 136 mmol/L (ref 135–145)

## 2023-07-14 LAB — PROTEIN / CREATININE RATIO, URINE
Creatinine, Urine: 41 mg/dL
Total Protein, Urine: <6 mg/dL

## 2023-07-14 LAB — VITAMIN D 25 HYDROXY (VIT D DEFICIENCY, FRACTURES): Vit D, 25-Hydroxy: 36.74 ng/mL (ref 30–100)

## 2023-07-15 DIAGNOSIS — N182 Chronic kidney disease, stage 2 (mild): Secondary | ICD-10-CM | POA: Diagnosis not present

## 2023-07-15 DIAGNOSIS — E870 Hyperosmolality and hypernatremia: Secondary | ICD-10-CM | POA: Diagnosis not present

## 2023-07-15 DIAGNOSIS — E875 Hyperkalemia: Secondary | ICD-10-CM | POA: Diagnosis not present

## 2023-07-15 DIAGNOSIS — F1290 Cannabis use, unspecified, uncomplicated: Secondary | ICD-10-CM | POA: Diagnosis not present

## 2023-07-15 LAB — MISC LABCORP TEST (SEND OUT): Labcorp test code: 121265

## 2023-07-15 LAB — MICROALBUMIN / CREATININE URINE RATIO
Creatinine, Urine: 35.2 mg/dL
Microalb Creat Ratio: <9 mg/g{creat} (ref 0–29)
Microalb, Ur: <3 ug/mL — ABNORMAL HIGH

## 2023-07-23 ENCOUNTER — Ambulatory Visit: Payer: Medicare HMO | Admitting: Family Medicine

## 2023-07-25 ENCOUNTER — Encounter: Payer: Self-pay | Admitting: Family Medicine

## 2023-07-28 ENCOUNTER — Encounter: Payer: Self-pay | Admitting: *Deleted

## 2023-08-30 ENCOUNTER — Other Ambulatory Visit: Payer: Self-pay | Admitting: Family Medicine

## 2023-09-01 ENCOUNTER — Other Ambulatory Visit: Payer: Self-pay

## 2023-09-01 MED ORDER — ATORVASTATIN CALCIUM 40 MG PO TABS: 40.0000 mg | ORAL_TABLET | Freq: Every day | ORAL | 3 refills | Status: AC

## 2023-09-01 MED ORDER — ATORVASTATIN CALCIUM 40 MG PO TABS: 40.0000 mg | ORAL_TABLET | Freq: Every day | ORAL | 3 refills | Status: DC

## 2023-09-07 ENCOUNTER — Other Ambulatory Visit: Payer: Self-pay | Admitting: Family Medicine

## 2023-10-12 ENCOUNTER — Other Ambulatory Visit: Payer: Self-pay | Admitting: Family Medicine

## 2023-10-13 ENCOUNTER — Other Ambulatory Visit: Payer: Self-pay

## 2023-10-13 MED ORDER — ALBUTEROL SULFATE HFA 108 (90 BASE) MCG/ACT IN AERS: INHALATION_SPRAY | RESPIRATORY_TRACT | 1 refills | Status: DC

## 2023-11-08 DIAGNOSIS — M5489 Other dorsalgia: Secondary | ICD-10-CM | POA: Diagnosis not present

## 2023-11-08 DIAGNOSIS — M549 Dorsalgia, unspecified: Secondary | ICD-10-CM | POA: Diagnosis not present

## 2023-11-08 DIAGNOSIS — M47816 Spondylosis without myelopathy or radiculopathy, lumbar region: Secondary | ICD-10-CM | POA: Diagnosis not present

## 2023-11-08 DIAGNOSIS — R531 Weakness: Secondary | ICD-10-CM | POA: Diagnosis not present

## 2023-11-08 DIAGNOSIS — Z789 Other specified health status: Secondary | ICD-10-CM | POA: Diagnosis not present

## 2023-11-18 ENCOUNTER — Other Ambulatory Visit: Payer: Self-pay | Admitting: Family Medicine

## 2023-12-03 DIAGNOSIS — M549 Dorsalgia, unspecified: Secondary | ICD-10-CM | POA: Diagnosis not present

## 2023-12-03 DIAGNOSIS — M545 Low back pain, unspecified: Secondary | ICD-10-CM | POA: Diagnosis not present

## 2023-12-03 DIAGNOSIS — G8929 Other chronic pain: Secondary | ICD-10-CM | POA: Diagnosis not present

## 2023-12-03 DIAGNOSIS — Z981 Arthrodesis status: Secondary | ICD-10-CM | POA: Diagnosis not present

## 2023-12-07 ENCOUNTER — Other Ambulatory Visit: Payer: Self-pay | Admitting: Family Medicine

## 2024-01-13 ENCOUNTER — Other Ambulatory Visit: Payer: Self-pay | Admitting: Family Medicine

## 2024-01-14 ENCOUNTER — Other Ambulatory Visit: Payer: Self-pay | Admitting: Family Medicine

## 2024-01-19 ENCOUNTER — Encounter: Payer: Self-pay | Admitting: Radiology

## 2024-01-20 DIAGNOSIS — M545 Low back pain, unspecified: Secondary | ICD-10-CM | POA: Diagnosis not present

## 2024-01-21 DIAGNOSIS — Z981 Arthrodesis status: Secondary | ICD-10-CM | POA: Diagnosis not present

## 2024-02-04 ENCOUNTER — Other Ambulatory Visit: Payer: Self-pay | Admitting: Family Medicine

## 2024-02-08 ENCOUNTER — Other Ambulatory Visit: Payer: Self-pay | Admitting: Family Medicine
